# Patient Record
Sex: Female | Born: 1950
Health system: Southern US, Community
[De-identification: ages and names within clinical notes are randomized; demographics above are authoritative.]

## PROBLEM LIST (undated history)

## (undated) DIAGNOSIS — F329 Major depressive disorder, single episode, unspecified: Secondary | ICD-10-CM

## (undated) DIAGNOSIS — I8390 Asymptomatic varicose veins of unspecified lower extremity: Secondary | ICD-10-CM

## (undated) DIAGNOSIS — F1010 Alcohol abuse, uncomplicated: Secondary | ICD-10-CM

## (undated) DIAGNOSIS — E785 Hyperlipidemia, unspecified: Secondary | ICD-10-CM

## (undated) DIAGNOSIS — R160 Hepatomegaly, not elsewhere classified: Secondary | ICD-10-CM

## (undated) DIAGNOSIS — D649 Anemia, unspecified: Secondary | ICD-10-CM

## (undated) DIAGNOSIS — N10 Acute tubulo-interstitial nephritis: Secondary | ICD-10-CM

## (undated) DIAGNOSIS — E559 Vitamin D deficiency, unspecified: Secondary | ICD-10-CM

## (undated) DIAGNOSIS — F32A Depression, unspecified: Secondary | ICD-10-CM

## (undated) DIAGNOSIS — I10 Essential (primary) hypertension: Secondary | ICD-10-CM

## (undated) DIAGNOSIS — G250 Essential tremor: Secondary | ICD-10-CM

## (undated) HISTORY — DX: Essential tremor: G25.0

## (undated) HISTORY — DX: Asymptomatic varicose veins of unspecified lower extremity: I83.90

## (undated) HISTORY — DX: Depression, unspecified: F32.A

## (undated) HISTORY — DX: Acute pyelonephritis: N10

## (undated) HISTORY — DX: Vitamin D deficiency, unspecified: E55.9

## (undated) HISTORY — DX: Essential (primary) hypertension: I10

## (undated) HISTORY — PX: SPLENECTOMY: SUR1306

## (undated) HISTORY — DX: Major depressive disorder, single episode, unspecified: F32.9

## (undated) HISTORY — DX: Anemia, unspecified: D64.9

## (undated) HISTORY — DX: Hyperlipidemia, unspecified: E78.5

## (undated) HISTORY — DX: Alcohol abuse, uncomplicated: F10.10

## (undated) HISTORY — DX: Hepatomegaly, not elsewhere classified: R16.0

## (undated) NOTE — *Deleted (*Deleted)
01/04/2020 9:21 PM   Eileen Stewart 09/22/1950 [id number]  Referring provider: Particia Nearing, PA-C [address] [place],  [place] [address]  No chief complaint on file.   HPI: Eileen Stewart is a 34 year old female with an urethral caruncle, microscopic hematuria and post menopausal bleeding who presents today for follow up.  Urethral caruncle She states that she has been consistent with applying the vaginal estrogen cream Monday, Wednesday nights and Friday nights.  She has not noted any spotting in her underwear or any episodes of gross hematuria.  She states that she feels the area is getting smaller when she examines it in the mirror.  Microscopic hematuria (high risk) Non smoker.  Cystoscopy 01/02/2019 with Dr. Apolinar Junes was NED.  Hematuria likely due urethral caruncle.  UA at PCP was positive for 3-10 RBC's on 03/27/2019 with negative urine culture.  She denies any gross hematuria.  Subsequent UAs have not demonstrated micro heme.  Post menopausal bleeding Underwent endometrial biopsy with Dr. Tiburcio Pea 11/19/2018.  Findings for benign tissue.  No reports of brown spots on underwear today  OAB Patient continues the oxybutynin XL 10 mg daily.  She states that she has noted a worsening in her urinary symptoms over the last several weeks.  She states on one particular occasion, she went for a walk and when she returned to her car she states her incontinent pad was wet and so was her clothing.  She stated that she did not even realize that she had an episode of incontinence.  She is also noted an increase in her urinary urgency as well.  UA is yellow hazy, specific gravity 1.020, pH 7.50 trace blood, 0-5 squamous epithelial cells, 0-5 WBCs, 0-5 RBCs and a few bacteria.    PMH: Past Medical History:  Diagnosis Date  . Acute pyelonephritis   . Alcohol abuse    Alcohol, remission since 2013  . Anemia   . Asymptomatic varicose veins, unspecified laterality   . Depression   .  Essential tremor   . Hyperlipidemia   . Hypertension   . Liver mass   . Vitamin D deficiency     Surgical History: Past Surgical History:  Procedure Laterality Date  . SPLENECTOMY      Home Medications:  Allergies as of 01/04/2020   No Known Allergies     Medication List       Accurate as of January 03, 2020  9:21 PM. If you have any questions, ask your nurse or doctor.        aspirin EC 81 MG tablet Take 1 tablet (81 mg total) by mouth daily.   buPROPion 300 MG 24 hr tablet Commonly known as: WELLBUTRIN XL TK 1 T PO D   CALCIUM 500/D PO Take by mouth.   lisinopril-hydrochlorothiazide 20-12.5 MG tablet Commonly known as: ZESTORETIC Take 1 tablet by mouth daily.   oxybutynin 10 MG 24 hr tablet Commonly known as: DITROPAN-XL Take 1 tablet (10 mg total) by mouth daily.   venlafaxine XR 75 MG 24 hr capsule Commonly known as: EFFEXOR-XR TAKE 1 CAPSULE(75 MG) BY MOUTH DAILY WITH BREAKFAST       Allergies: No Known Allergies  Family History: Family History  Problem Relation Age of Onset  . Hyperlipidemia Mother   . Hypertension Mother   . Alcohol abuse Father   . Depression Sister   . Anxiety disorder Sister   . Parkinson's disease Cousin     Social History:  reports that  she has never smoked. She has never used smokeless tobacco. She reports current alcohol use. She reports that she does not use drugs.  ROS: For pertinent review of systems please refer to history of present illness  Physical Exam: There were no vitals taken for this visit.  Constitutional:  Well nourished. Alert and oriented, No acute distress. HEENT: [place] AT, moist mucus membranes.  Trachea midline, no masses. Cardiovascular: No clubbing, cyanosis, or edema. Respiratory: Normal respiratory effort, no increased work of breathing. GI: Abdomen is soft, non tender, non distended, no abdominal masses. Liver and spleen not palpable.  No hernias appreciated.  Stool sample for occult testing  is not indicated.   GU: No CVA tenderness.  No bladder fullness or masses.  *** external genitalia, *** pubic hair distribution, no lesions.  Normal urethral meatus, no lesions, no prolapse, no discharge.   No urethral masses, tenderness and/or tenderness. No bladder fullness, tenderness or masses. *** vagina mucosa, *** estrogen effect, no discharge, no lesions, *** pelvic support, *** cystocele and *** rectocele noted.  No cervical motion tenderness.  Uterus is freely mobile and non-fixed.  No adnexal/parametria masses or tenderness noted.  Anus and perineum are without rashes or lesions.   ***  Skin: No rashes, bruises or suspicious lesions. Lymph: No cervical or inguinal adenopathy. Neurologic: Grossly intact, no focal deficits, moving all 4 extremities. Psychiatric: Normal mood and affect.   Laboratory Data: Lab Results  Component Value Date   WBC 4.7 03/27/2019   HGB 14.9 03/27/2019   HCT 44.8 03/27/2019   MCV 88 03/27/2019   PLT 340 03/27/2019    Lab Results  Component Value Date   CREATININE 0.78 03/27/2019    Lab Results  Component Value Date   TSH 1.610 03/27/2019       Component Value Date/Time   CHOL 197 03/27/2019 1117   HDL 76 03/27/2019 1117   LDLCALC 113 (H) 03/27/2019 1117    Lab Results  Component Value Date   AST 23 03/27/2019   Lab Results  Component Value Date   ALT 18 03/27/2019    Urinalysis *** I have reviewed the labs.   Pertinent Imaging: ***    Assessment & Plan:    1. Urethral caruncle She will continue applying the vaginal estrogen cream on Monday, Wednesday and Friday It is cost prohibitive for her, so we will try to provide samples for her as it can take several months for the physiologic changes to take place within the vagina She will follow-up in 6 months for exam  2. Microscopic hematuria Likely due to urethral caruncle as microscopic hematuria has not been identified after treating the caruncle with the vaginal estrogen  cream UA is negative today No reports of gross hematuria She will return in 6 months for repeat UA and report any gross hematuria in the interim  3. Post menopausal bleeding Endometrial biopsy negative in 11/2018  4. OAB Not at goal with oxybuytnin XL 10 mg daily  We discussed trying samples of another overactive bladder agent, Myrbetriq, but she inquired if there was higher dose of oxybutynin XL available I stated that there was and since she has just filled her oxybutynin XL 10 mg prescription, she will take 2 of the oxybutynin XL 10 mg daily to see if this improves her urinary symptoms If not, she will follow-up sooner than the scheduled 6 months    No follow-ups on file.  These notes generated with voice recognition software. I apologize for typographical  errors.  Zara Council, PA-C  [facility] [address]  [place], [place] [address] [phone]

---

## 2013-03-12 LAB — COLOGUARD: Cologuard: NEGATIVE

## 2014-10-05 LAB — HM HIV SCREENING LAB: HM HIV SCREENING: NEGATIVE

## 2014-10-05 LAB — HM HEPATITIS C SCREENING LAB: HM Hepatitis Screen: POSITIVE

## 2014-10-08 LAB — HM PAP SMEAR

## 2016-01-04 LAB — HM MAMMOGRAPHY

## 2017-04-17 ENCOUNTER — Encounter: Payer: Self-pay | Admitting: Family Medicine

## 2017-04-17 ENCOUNTER — Ambulatory Visit (INDEPENDENT_AMBULATORY_CARE_PROVIDER_SITE_OTHER): Payer: BLUE CROSS/BLUE SHIELD | Admitting: Family Medicine

## 2017-04-17 VITALS — BP 111/69 | HR 69 | Temp 97.8°F | Ht 60.0 in | Wt 112.6 lb

## 2017-04-17 DIAGNOSIS — F331 Major depressive disorder, recurrent, moderate: Secondary | ICD-10-CM | POA: Diagnosis not present

## 2017-04-17 DIAGNOSIS — F339 Major depressive disorder, recurrent, unspecified: Secondary | ICD-10-CM | POA: Insufficient documentation

## 2017-04-17 DIAGNOSIS — I10 Essential (primary) hypertension: Secondary | ICD-10-CM | POA: Insufficient documentation

## 2017-04-17 DIAGNOSIS — E785 Hyperlipidemia, unspecified: Secondary | ICD-10-CM | POA: Insufficient documentation

## 2017-04-17 DIAGNOSIS — E782 Mixed hyperlipidemia: Secondary | ICD-10-CM | POA: Diagnosis not present

## 2017-04-17 DIAGNOSIS — Z7689 Persons encountering health services in other specified circumstances: Secondary | ICD-10-CM

## 2017-04-17 MED ORDER — FLUOXETINE HCL 10 MG PO TABS: 10.0000 mg | ORAL_TABLET | Freq: Every day | ORAL | 0 refills | Status: DC

## 2017-04-17 NOTE — Progress Notes (Signed)
BP 111/69 (BP Location: Right Arm, Patient Position: Sitting, Cuff Size: Normal)   Pulse 69   Temp 97.8 F (36.6 C) (Tympanic)   Ht 5' (1.524 m)   Wt 112 lb 9.6 oz (51.1 kg)   SpO2 99%   BMI 21.99 kg/m    Subjective:    Patient ID: Eileen Stewart, female    DOB: 1951-01-20, 67 y.o.   MRN: [id number]  HPI: Eileen Stewart is a 67 y.o. female  Chief Complaint  Patient presents with  . Establish Care   Pt here today to establish care. Just moved to the area and needing new PCP. Hx of HTN, hyperlipidemia, and intermittent bouts with depression. HTN well controlled with lisinopril HCTZ, does not check home BPs but always WNL during OVs in the past. Cholesterol controlled with atorvastatin and lifestyle habits. Denies side effects, compliant with medications. Denies CP, SOB, dizziness, HAs.   Only concern today is her depression. States many years ago her moods became out of control and for a short time she needed medications. Came off once she was feeling better and has not needed them since. Has spoken to a counselor off and on the past year. Notes the past 2 months significant dysphoric moods, uncontrollable crying spells, anhedonia. Denies SI/HI, hallucinations, major mood swings. No recent stressors other than the loss of her uncle a week ago, but sxs started prior to that.   Last CPE was done 12/2016 with normal findings.   Depression screen PHQ 2/9 04/17/2017  Decreased Interest 3  Down, Depressed, Hopeless 3  PHQ - 2 Score 6  Altered sleeping 3  Tired, decreased energy 3  Change in appetite 0  Feeling bad or failure about yourself  3  Trouble concentrating 0  Moving slowly or fidgety/restless 0  Suicidal thoughts 0  PHQ-9 Score 15   Relevant past medical, surgical, family and social history reviewed and updated as indicated. Interim medical history since our last visit reviewed. Allergies and medications reviewed and updated.  Review of Systems  Constitutional: Positive  for fatigue.  HENT: Negative.   Eyes: Negative.   Respiratory: Negative.   Cardiovascular: Negative.   Gastrointestinal: Negative.   Genitourinary: Negative.   Musculoskeletal: Negative.   Skin: Negative.   Neurological: Negative.   Psychiatric/Behavioral: Positive for dysphoric mood. The patient is nervous/anxious (constant worrying).    Per HPI unless specifically indicated above     Objective:    BP 111/69 (BP Location: Right Arm, Patient Position: Sitting, Cuff Size: Normal)   Pulse 69   Temp 97.8 F (36.6 C) (Tympanic)   Ht 5' (1.524 m)   Wt 112 lb 9.6 oz (51.1 kg)   SpO2 99%   BMI 21.99 kg/m   Wt Readings from Last 3 Encounters:  04/17/17 112 lb 9.6 oz (51.1 kg)    Physical Exam  Constitutional: She is oriented to person, place, and time. She appears well-developed and well-nourished. No distress.  HENT:  Head: Atraumatic.  Eyes: Conjunctivae are normal. Pupils are equal, round, and reactive to light.  Neck: Normal range of motion. Neck supple.  Cardiovascular: Normal rate and normal heart sounds.  Pulmonary/Chest: Effort normal and breath sounds normal. No respiratory distress.  Musculoskeletal: Normal range of motion.  Neurological: She is alert and oriented to person, place, and time.  Skin: Skin is warm and dry.  Psychiatric:  Tearful during interview, appears nervous  Nursing note and vitals reviewed.  Results for orders placed or performed in visit on 04/17/17  HM MAMMOGRAPHY  Result Value Ref Range   HM Mammogram 0-4 Bi-Rad 0-4 Bi-Rad, Self Reported Normal  HM HIV SCREENING LAB  Result Value Ref Range   HM HIV Screening Negative - Validated   HM HEPATITIS C SCREENING LAB  Result Value Ref Range   HM Hepatitis Screen Positive- Validated   HM PAP SMEAR  Result Value Ref Range   HM Pap smear [facility] Everywhere   Cologuard  Result Value Ref Range   Cologuard Negative       Assessment & Plan:   Problem List Items Addressed This Visit       Cardiovascular and Mediastinum   Hypertension    Stable and well controlled, continue current regimen      Relevant Medications   aspirin EC 81 MG tablet   atorvastatin (LIPITOR) 10 MG tablet   lisinopril-hydrochlorothiazide (PRINZIDE,ZESTORETIC) 20-12.5 MG tablet     Other   Hyperlipidemia - Primary    Stable, continue current regimen      Relevant Medications   aspirin EC 81 MG tablet   atorvastatin (LIPITOR) 10 MG tablet   lisinopril-hydrochlorothiazide (PRINZIDE,ZESTORETIC) 20-12.5 MG tablet   Depression    Long discussion about options, pt agreeable to starting 10 mg prozac and establishing with a new counselor. Risks and side effects reviewed at length, and handout of drug information given. F/u in 6 weeks for recheck      Relevant Medications   FLUoxetine (PROZAC) 10 MG tablet    Other Visit Diagnoses    Encounter to establish care           Follow up plan: Return in about 6 weeks (around 05/29/2017) for Depression f/u.

## 2017-04-17 NOTE — Assessment & Plan Note (Signed)
Long discussion about options, pt agreeable to starting 10 mg prozac and establishing with a new counselor. Risks and side effects reviewed at length, and handout of drug information given. F/u in 6 weeks for recheck

## 2017-04-17 NOTE — Assessment & Plan Note (Signed)
Stable and well controlled, continue current regimen 

## 2017-04-17 NOTE — Assessment & Plan Note (Signed)
Stable, continue current regimen 

## 2017-04-17 NOTE — Patient Instructions (Addendum)
Fluoxetine capsules or tablets (Depression/Mood Disorders) What is this medicine? FLUOXETINE (floo OX e teen) belongs to a class of drugs known as selective serotonin reuptake inhibitors (SSRIs). It helps to treat mood problems such as depression, obsessive compulsive disorder, and panic attacks. It can also treat certain eating disorders. This medicine may be used for other purposes; ask your health care provider or pharmacist if you have questions. COMMON BRAND NAME(S): Prozac What should I tell my health care provider before I take this medicine? They need to know if you have any of these conditions: -bipolar disorder or a family history of bipolar disorder -bleeding disorders -glaucoma -heart disease -liver disease -low levels of sodium in the blood -seizures -suicidal thoughts, plans, or attempt; a previous suicide attempt by you or a family member -take MAOIs like Carbex, Eldepryl, Marplan, Nardil, and Parnate -take medicines that treat or prevent blood clots -thyroid disease -an unusual or allergic reaction to fluoxetine, other medicines, foods, dyes, or preservatives -pregnant or trying to get pregnant -breast-feeding How should I use this medicine? Take this medicine by mouth with a glass of water. Follow the directions on the prescription label. You can take this medicine with or without food. Take your medicine at regular intervals. Do not take it more often than directed. Do not stop taking this medicine suddenly except upon the advice of your doctor. Stopping this medicine too quickly may cause serious side effects or your condition may worsen. A special MedGuide will be given to you by the pharmacist with each prescription and refill. Be sure to read this information carefully each time. Talk to your pediatrician regarding the use of this medicine in children. While this drug may be prescribed for children as young as 7 years for selected conditions, precautions do  apply. Overdosage: If you think you have taken too much of this medicine contact a poison control center or emergency room at once. NOTE: This medicine is only for you. Do not share this medicine with others. What if I miss a dose? If you miss a dose, skip the missed dose and go back to your regular dosing schedule. Do not take double or extra doses. What may interact with this medicine? Do not take this medicine with any of the following medications: -other medicines containing fluoxetine, like Sarafem or Symbyax -cisapride -linezolid -MAOIs like Carbex, Eldepryl, Marplan, Nardil, and Parnate -methylene blue (injected into a vein) -pimozide -thioridazine This medicine may also interact with the following medications: -alcohol -amphetamines -aspirin and aspirin-like medicines -carbamazepine -certain medicines for depression, anxiety, or psychotic disturbances -certain medicines for migraine headaches like almotriptan, eletriptan, frovatriptan, naratriptan, rizatriptan, sumatriptan, zolmitriptan -digoxin -diuretics -fentanyl -flecainide -furazolidone -isoniazid -lithium -medicines for sleep -medicines that treat or prevent blood clots like warfarin, enoxaparin, and dalteparin -NSAIDs, medicines for pain and inflammation, like ibuprofen or naproxen -phenytoin -procarbazine -propafenone -rasagiline -ritonavir -supplements like St. John's wort, kava kava, valerian -tramadol -tryptophan -vinblastine This list may not describe all possible interactions. Give your health care provider a list of all the medicines, herbs, non-prescription drugs, or dietary supplements you use. Also tell them if you smoke, drink alcohol, or use illegal drugs. Some items may interact with your medicine. What should I watch for while using this medicine? Tell your doctor if your symptoms do not get better or if they get worse. Visit your doctor or health care professional for regular checks on your  progress. Because it may take several weeks to see the full effects of this medicine, it   is important to continue your treatment as prescribed by your doctor. Patients and their families should watch out for new or worsening thoughts of suicide or depression. Also watch out for sudden changes in feelings such as feeling anxious, agitated, panicky, irritable, hostile, aggressive, impulsive, severely restless, overly excited and hyperactive, or not being able to sleep. If this happens, especially at the beginning of treatment or after a change in dose, call your health care professional. You may get drowsy or dizzy. Do not drive, use machinery, or do anything that needs mental alertness until you know how this medicine affects you. Do not stand or sit up quickly, especially if you are an older patient. This reduces the risk of dizzy or fainting spells. Alcohol may interfere with the effect of this medicine. Avoid alcoholic drinks. Your mouth may get dry. Chewing sugarless gum or sucking hard candy, and drinking plenty of water may help. Contact your doctor if the problem does not go away or is severe. This medicine may affect blood sugar levels. If you have diabetes, check with your doctor or health care professional before you change your diet or the dose of your diabetic medicine. What side effects may I notice from receiving this medicine? Side effects that you should report to your doctor or health care professional as soon as possible: -allergic reactions like skin rash, itching or hives, swelling of the face, lips, or tongue -anxious -black, tarry stools -breathing problems -changes in vision -confusion -elevated mood, decreased need for sleep, racing thoughts, impulsive behavior -eye pain -fast, irregular heartbeat -feeling faint or lightheaded, falls -feeling agitated, angry, or irritable -hallucination, loss of contact with reality -loss of balance or coordination -loss of memory -painful  or prolonged erections -restlessness, pacing, inability to keep still -seizures -stiff muscles -suicidal thoughts or other mood changes -trouble sleeping -unusual bleeding or bruising -unusually weak or tired -vomiting Side effects that usually do not require medical attention (report to your doctor or health care professional if they continue or are bothersome): -change in appetite or weight -change in sex drive or performance -diarrhea -dry mouth -headache -increased sweating -nausea -tremors This list may not describe all possible side effects. Call your doctor for medical advice about side effects. You may report side effects to FDA at 1-800-FDA-1088. Where should I keep my medicine? Keep out of the reach of children. Store at room temperature between 15 and 30 degrees C (59 and 86 degrees F). Throw away any unused medicine after the expiration date. NOTE: This sheet is a summary. It may not cover all possible information. If you have questions about this medicine, talk to your doctor, pharmacist, or health care provider.  2018 Elsevier/Gold Standard (2015-07-30 15:55:27)  

## 2017-04-22 ENCOUNTER — Ambulatory Visit: Payer: Self-pay | Admitting: Family Medicine

## 2017-05-09 ENCOUNTER — Telehealth: Payer: Self-pay | Admitting: Family Medicine

## 2017-05-09 MED ORDER — FLUOXETINE HCL 10 MG PO TABS: 10.0000 mg | ORAL_TABLET | Freq: Every day | ORAL | 0 refills | Status: DC

## 2017-05-09 NOTE — Telephone Encounter (Signed)
Copied from CRM [id number]. Topic: Quick Communication - Rx Refill/Question >> May 09, 2017 11:40 AM Guinevere FerrariMorris, Eileen Stewart, NT wrote: Medication: FLUoxetine (PROZAC) 10 MG tablet   Has the patient contacted their pharmacy? yes   (Agent: If no, request that the patient contact the pharmacy for the refill.)   Preferred Pharmacy (with phone number or street name):[facility] [id number] - [place] - [id number] [facility] [phone] (Phone) [phone] (Fax)      Agent: Please be advised that RX refills may take up to 3 business days. We ask that you follow-up with your pharmacy.

## 2017-05-09 NOTE — Telephone Encounter (Signed)
Rx sent 

## 2017-05-13 ENCOUNTER — Ambulatory Visit: Payer: BLUE CROSS/BLUE SHIELD | Admitting: Family Medicine

## 2017-05-14 ENCOUNTER — Encounter: Payer: Self-pay | Admitting: Family Medicine

## 2017-05-14 ENCOUNTER — Ambulatory Visit (INDEPENDENT_AMBULATORY_CARE_PROVIDER_SITE_OTHER): Payer: BLUE CROSS/BLUE SHIELD | Admitting: Family Medicine

## 2017-05-14 VITALS — BP 118/72 | HR 63 | Temp 97.5°F | Wt 112.5 lb

## 2017-05-14 DIAGNOSIS — G250 Essential tremor: Secondary | ICD-10-CM

## 2017-05-14 DIAGNOSIS — F331 Major depressive disorder, recurrent, moderate: Secondary | ICD-10-CM | POA: Diagnosis not present

## 2017-05-14 MED ORDER — FLUOXETINE HCL 20 MG PO TABS: 20.0000 mg | ORAL_TABLET | Freq: Every day | ORAL | 3 refills | Status: DC

## 2017-05-14 NOTE — Progress Notes (Signed)
BP 118/72 (BP Location: Left Arm, Patient Position: Sitting, Cuff Size: Normal)   Pulse 63   Temp (!) 97.5 F (36.4 C) (Tympanic)   Wt 112 lb 8 oz (51 kg)   SpO2 99%   BMI 21.97 kg/m    Subjective:    Patient ID: Eileen Stewart, female    DOB: 1951-03-07, 67 y.o.   MRN: [id number]  HPI: Eileen Stewart is a 67 y.o. female  Chief Complaint  Patient presents with  . Depression    F/U patient states she still has bad days, but good days as well.  . Shaking    Patient states her hands will shake. Stated possible referral to Neurologist.   Pt here today for 1 month f/u on her depression after starting low dose prozac. Still not quite wanting to go out and socialize as much as she used to, but overall has noticed a significant benefit since starting the medicine. Has not followed up regarding counseling just yet but plans to. Not having the crying spells she used to and feeling so much happier overall. Denies side effects, including SI/HI.   Right hand tremor, worsening x 3 years. 2 cousins with Parkinson's disease so pt is concerned about it. Seems to only be with intention, and much worse with stress. Has not tried anything OTC for sxs.   Past Medical History:  Diagnosis Date  . Acute pyelonephritis   . Alcohol abuse    Alcohol, remission since 2013  . Anemia   . Asymptomatic varicose veins, unspecified laterality   . Depression   . Essential tremor   . Hyperlipidemia   . Hypertension   . Liver mass   . Vitamin D deficiency    Social History   Socioeconomic History  . Marital status: Single    Spouse name: Not on file  . Number of children: Not on file  . Years of education: Not on file  . Highest education level: Not on file  Social Needs  . Financial resource strain: Not on file  . Food insecurity - worry: Not on file  . Food insecurity - inability: Not on file  . Transportation needs - medical: Not on file  . Transportation needs - non-medical: Not on file    Occupational History  . Not on file  Tobacco Use  . Smoking status: Never Smoker  . Smokeless tobacco: Never Used  Substance and Sexual Activity  . Alcohol use: No    Frequency: Never    Comment: Alcohol remession since 2013  . Drug use: No  . Sexual activity: Not on file  Other Topics Concern  . Not on file  Social History Narrative  . Not on file   Relevant past medical, surgical, family and social history reviewed and updated as indicated. Interim medical history since our last visit reviewed. Allergies and medications reviewed and updated.  Review of Systems  Per HPI unless specifically indicated above     Objective:    BP 118/72 (BP Location: Left Arm, Patient Position: Sitting, Cuff Size: Normal)   Pulse 63   Temp (!) 97.5 F (36.4 C) (Tympanic)   Wt 112 lb 8 oz (51 kg)   SpO2 99%   BMI 21.97 kg/m   Wt Readings from Last 3 Encounters:  05/14/17 112 lb 8 oz (51 kg)  04/17/17 112 lb 9.6 oz (51.1 kg)    Physical Exam  Constitutional: She is oriented to person, place, and time. She appears well-developed and well-nourished. No  distress.  HENT:  Head: Atraumatic.  Eyes: Conjunctivae are normal. Pupils are equal, round, and reactive to light. No scleral icterus.  Neck: Normal range of motion. Neck supple.  Cardiovascular: Normal rate, regular rhythm and normal heart sounds.  Pulmonary/Chest: Effort normal and breath sounds normal.  Musculoskeletal: Normal range of motion.  Neurological: She is alert and oriented to person, place, and time.  Minimal tremor of intention, mainly in right hand  Skin: Skin is warm and dry.  Psychiatric: She has a normal mood and affect. Her behavior is normal.  Nursing note and vitals reviewed.  Results for orders placed or performed in visit on 04/17/17  HM MAMMOGRAPHY  Result Value Ref Range   HM Mammogram 0-4 Bi-Rad 0-4 Bi-Rad, Self Reported Normal  HM HIV SCREENING LAB  Result Value Ref Range   HM HIV Screening Negative -  Validated   HM HEPATITIS C SCREENING LAB  Result Value Ref Range   HM Hepatitis Screen Positive- Validated   HM PAP SMEAR  Result Value Ref Range   HM Pap smear [facility] Everywhere   Cologuard  Result Value Ref Range   Cologuard Negative       Assessment & Plan:   Problem List Items Addressed This Visit      Nervous and Auditory   Benign essential tremor    Reassurance given to pt, treatment and/or referral offered. Pt wanting to hold off for now and see if things improve with the improvement of her stress levels once the prozac dosing is adequate        Other   Depression - Primary    Significant improvement with addition of prozac, will increase to 20 mg and monitor closely for further benefit      Relevant Medications   FLUoxetine (PROZAC) 20 MG tablet       Follow up plan: Return in about 8 weeks (around 07/09/2017) for Mood f/u.

## 2017-05-16 DIAGNOSIS — G250 Essential tremor: Secondary | ICD-10-CM | POA: Insufficient documentation

## 2017-05-16 NOTE — Assessment & Plan Note (Signed)
Reassurance given to pt, treatment and/or referral offered. Pt wanting to hold off for now and see if things improve with the improvement of her stress levels once the prozac dosing is adequate

## 2017-05-16 NOTE — Assessment & Plan Note (Signed)
Significant improvement with addition of prozac, will increase to 20 mg and monitor closely for further benefit

## 2017-05-16 NOTE — Patient Instructions (Signed)
Follow up in 2 months

## 2017-05-29 ENCOUNTER — Ambulatory Visit: Payer: BLUE CROSS/BLUE SHIELD | Admitting: Family Medicine

## 2017-06-19 ENCOUNTER — Telehealth: Payer: Self-pay | Admitting: Family Medicine

## 2017-06-19 NOTE — Telephone Encounter (Signed)
Copied from CRM [id number]. Topic: Quick Communication - See Telephone Encounter >> Jun 19, 2017  1:28 PM Lorrine KinMcGee, Conlan Miceli B, [place] wrote: CRM for notification. See Telephone encounter for: 06/19/17. Patient calling to request a call from Roosvelt Maserachel Lane or her assistant. She has some medication questions regarding her FLUoxetine (PROZAC) 20 MG tablet. She started with 10mg  tablet and was increased to 20mg . Wants to know if it is ok for her to increase her dose and by how much? She was doing ok, but states she took a plunge and is not quite back where she was. Patient also stated that she has not yet started her counseling.

## 2017-06-19 NOTE — Telephone Encounter (Signed)
Returned call, left message to call back. She is fine to increase to 40 mg if desired - I will send in increased dose if she would like

## 2017-06-20 MED ORDER — FLUOXETINE HCL 40 MG PO CAPS: 40.0000 mg | ORAL_CAPSULE | Freq: Every day | ORAL | 1 refills | Status: DC

## 2017-06-20 NOTE — Telephone Encounter (Signed)
Patient returned my call. I let her know what Fleet ContrasRachel said and patient would like the increased dose sent in to the pharmacy in her chart.

## 2017-06-20 NOTE — Telephone Encounter (Signed)
Called and left patient a VM asking for her to please return my call.  

## 2017-06-20 NOTE — Telephone Encounter (Signed)
Rx increased.

## 2017-07-09 ENCOUNTER — Ambulatory Visit: Payer: BLUE CROSS/BLUE SHIELD | Admitting: Family Medicine

## 2017-07-30 ENCOUNTER — Ambulatory Visit (INDEPENDENT_AMBULATORY_CARE_PROVIDER_SITE_OTHER): Payer: BLUE CROSS/BLUE SHIELD

## 2017-07-30 ENCOUNTER — Other Ambulatory Visit: Payer: Self-pay

## 2017-07-30 ENCOUNTER — Ambulatory Visit
Admission: EM | Admit: 2017-07-30 | Discharge: 2017-07-30 | Disposition: A | Discharge status: Home / Self Care | Payer: BLUE CROSS/BLUE SHIELD | Attending: Family Medicine | Admitting: Family Medicine

## 2017-07-30 ENCOUNTER — Encounter: Payer: Self-pay | Admitting: Emergency Medicine

## 2017-07-30 DIAGNOSIS — M79644 Pain in right finger(s): Secondary | ICD-10-CM | POA: Diagnosis not present

## 2017-07-30 DIAGNOSIS — S60221A Contusion of right hand, initial encounter: Secondary | ICD-10-CM | POA: Diagnosis not present

## 2017-07-30 DIAGNOSIS — W230XXA Caught, crushed, jammed, or pinched between moving objects, initial encounter: Secondary | ICD-10-CM

## 2017-07-30 NOTE — ED Triage Notes (Signed)
Patient got her right hand caught in the car window yesterday.  Patient c/o bruising and pain on the top of her right hand.

## 2017-07-30 NOTE — Discharge Instructions (Signed)
Apply ice 20 minutes out of every 2 hours 4-5 times daily for comfort.  °

## 2017-07-30 NOTE — ED Provider Notes (Signed)
[facility]    CSN: [id number] Arrival date & time: 07/30/17  1127     History   Chief Complaint Chief Complaint  Patient presents with  . Hand Pain    HPI Eileen Stewart is a 67 y.o. female.   HPI  67 year old female right-hand-dominant states that yesterday she had her right hand caught in a window that was closed on her hand.  That time she has had pain and swelling mostly of the PIP joint of the right middle finger a large amount of ecchymosis over the dorsum of the hand.  She has additional ecchymosis on the volar aspect of the MP and PIP area of the third finger.  He has good range of motion that is comfortable.         Past Medical History:  Diagnosis Date  . Acute pyelonephritis   . Alcohol abuse    Alcohol, remission since 2013  . Anemia   . Asymptomatic varicose veins, unspecified laterality   . Depression   . Essential tremor   . Hyperlipidemia   . Hypertension   . Liver mass   . Vitamin D deficiency     Patient Active Problem List   Diagnosis Date Noted  . Benign essential tremor 05/16/2017  . Hyperlipidemia 04/17/2017  . Hypertension 04/17/2017  . Depression 04/17/2017    Past Surgical History:  Procedure Laterality Date  . SPLENECTOMY      OB History   None      Home Medications    Prior to Admission medications   Medication Sig Start Date End Date Taking? Authorizing Provider  aspirin EC 81 MG tablet Take by mouth.   Yes [provider]  atorvastatin (LIPITOR) 10 MG tablet TK 1 T PO QD 02/08/17  Yes [provider]  Calcium-Magnesium-Vitamin D (CALCIUM 500 PO) Take by mouth.   Yes [provider]  FLUoxetine (PROZAC) 40 MG capsule Take 1 capsule (40 mg total) by mouth daily. 06/20/17  Yes Particia Nearing, PA-C  lisinopril-hydrochlorothiazide (PRINZIDE,ZESTORETIC) 20-12.5 MG tablet TK 1 T PO QD 02/08/17  Yes [provider]    Family History Family History  Problem Relation  Age of Onset  . Hyperlipidemia Mother   . Hypertension Mother   . Alcohol abuse Father   . Depression Sister   . Anxiety disorder Sister   . Parkinson's disease Cousin     Social History Social History   Tobacco Use  . Smoking status: Never Smoker  . Smokeless tobacco: Never Used  Substance Use Topics  . Alcohol use: No    Frequency: Never    Comment: Alcohol remession since 2013  . Drug use: No     Allergies   Patient has no known allergies.   Review of Systems Review of Systems  Constitutional: Positive for activity change. Negative for appetite change, chills, fatigue and fever.  Musculoskeletal: Positive for arthralgias and joint swelling.  Skin: Positive for color change.  All other systems reviewed and are negative.    Physical Exam Triage Vital Signs ED Triage Vitals  Enc Vitals Group     BP 07/30/17 1146 123/68     Pulse Rate 07/30/17 1146 67     Resp 07/30/17 1146 16     Temp 07/30/17 1146 98.2 F (36.8 C)     Temp Source 07/30/17 1146 Oral     SpO2 07/30/17 1146 98 %     Weight 07/30/17 1143 110 lb (49.9 kg)  Height 07/30/17 1143  (1.549 m)     Head Circumference --      Peak Flow --      Pain Score 07/30/17 1143 3     Pain Loc --      Pain Edu? --      Excl. in GC? --    No data found.  Updated Vital Signs BP 123/68 (BP Location: Left Arm)   Pulse 67   Temp 98.2 F (36.8 C) (Oral)   Resp 16   Ht  (1.549 m)   Wt 110 lb (49.9 kg)   SpO2 98%   BMI 20.78 kg/m   Visual Acuity Right Eye Distance:   Left Eye Distance:   Bilateral Distance:    Right Eye Near:   Left Eye Near:    Bilateral Near:     Physical Exam  Constitutional: She is oriented to person, place, and time. She appears well-developed and well-nourished. No distress.  HENT:  Head: Normocephalic.  Eyes: Pupils are equal, round, and reactive to light.  Neck: Normal range of motion. Neck supple.  Musculoskeletal: Normal range of motion. She exhibits edema  and tenderness.  Exmanation of the right dominant hand shows extensive ecchymosis over the dorsum of the hand.  She also has ecchymosis over the volar aspect but confined to the base of the third digit over the metacarpal and also over the PIP volarly.  He has very good range of motion with only slight decreased of the middle finger.  She has tenderness swelling over the PIP to A&P compression and  lateral compression.  The collateral ligaments are intact.  She has a small abrasion overlying the dorsum of the middle finger over the  Proximal phalanx distally.  Neurological: She is alert and oriented to person, place, and time.  Skin: Skin is warm and dry. She is not diaphoretic.  Psychiatric: She has a normal mood and affect. Her behavior is normal. Judgment and thought content normal.  Nursing note and vitals reviewed.    UC Treatments / Results  Labs (all labs ordered are listed, but only abnormal results are displayed) Labs Reviewed - No data to display  EKG None  Radiology Dg Finger Middle Right  Result Date: 07/30/2017 CLINICAL DATA:  Right middle finger pain and swelling after hand got caught in a window. EXAM: RIGHT MIDDLE FINGER 2+V COMPARISON:  None. FINDINGS: No acute fracture or dislocation. Joint spaces are preserved. Bone mineralization is normal. Mild dorsal soft tissue swelling over the PIP joint. IMPRESSION: Mild dorsal soft tissue swelling over the PIP joint. No acute osseous abnormality. Electronically Signed   By: Obie Dredge M.D.   On: 07/30/2017 12:20    Procedures Procedures (including critical care time)  Medications Ordered in UC Medications - No data to display  Initial Impression / Assessment and Plan / UC Course  I have reviewed the triage vital signs and the nursing notes.  Pertinent labs & imaging results that were available during my care of the patient were reviewed by me and considered in my medical decision making (see chart for details).      .Plan: 1. Test/x-ray results and diagnosis reviewed with patient 2. rx as per orders; risks, benefits, potential side effects reviewed with patient 3. Recommend supportive treatment with ice and elevation to control swelling and pain.  Gentle range of motion stretching exercises to maintain joint motion.  Were demonstrated to her.  Follow-up with her primary care if she is not  improving 4. F/u prn if symptoms worsen or don't improve  Final Clinical Impressions(s) / UC Diagnoses   Final diagnoses:  Contusion of right hand, initial encounter     Discharge Instructions     Apply ice 20 minutes out of every 2 hours 4-5 times daily for comfort.     ED Prescriptions    None     Controlled Substance Prescriptions [place] Controlled Substance Registry consulted? Not Applicable   Lutricia Feil, PA-C 07/30/17 1255

## 2017-09-16 ENCOUNTER — Emergency Department
Admission: EM | Admit: 2017-09-16 | Discharge: 2017-09-16 | Disposition: A | Discharge status: Home / Self Care | Payer: Medicare HMO | Attending: Student in an Organized Health Care Education/Training Program | Admitting: Student in an Organized Health Care Education/Training Program

## 2017-09-16 ENCOUNTER — Other Ambulatory Visit: Payer: Self-pay

## 2017-09-16 ENCOUNTER — Encounter: Payer: Self-pay | Admitting: *Deleted

## 2017-09-16 DIAGNOSIS — I10 Essential (primary) hypertension: Secondary | ICD-10-CM | POA: Insufficient documentation

## 2017-09-16 DIAGNOSIS — Z79899 Other long term (current) drug therapy: Secondary | ICD-10-CM | POA: Diagnosis not present

## 2017-09-16 DIAGNOSIS — N3090 Cystitis, unspecified without hematuria: Secondary | ICD-10-CM

## 2017-09-16 DIAGNOSIS — T675XXA Heat exhaustion, unspecified, initial encounter: Secondary | ICD-10-CM | POA: Diagnosis present

## 2017-09-16 DIAGNOSIS — Z7982 Long term (current) use of aspirin: Secondary | ICD-10-CM | POA: Insufficient documentation

## 2017-09-16 DIAGNOSIS — T673XXA Heat exhaustion, anhydrotic, initial encounter: Secondary | ICD-10-CM | POA: Diagnosis not present

## 2017-09-16 LAB — URINALYSIS, COMPLETE (UACMP) WITH MICROSCOPIC
Bilirubin Urine: NEGATIVE
GLUCOSE, UA: NEGATIVE mg/dL
HGB URINE DIPSTICK: NEGATIVE
Ketones, ur: 20 mg/dL — AB
NITRITE: NEGATIVE
PROTEIN: NEGATIVE mg/dL
Specific Gravity, Urine: 1.016 (ref 1.005–1.030)
WBC, UA: >50 WBC/hpf — ABNORMAL HIGH (ref 0–5)
pH: 7 (ref 5.0–8.0)

## 2017-09-16 MED ORDER — CEPHALEXIN 500 MG PO CAPS: 500.0000 mg | ORAL_CAPSULE | Freq: Three times a day (TID) | ORAL | 0 refills | Status: AC

## 2017-09-16 NOTE — ED Triage Notes (Signed)
Pt reports having been outside in the heat for an extended amount of time today and started to experience nausea and dry heaves. Pt wen tot he shower to cool self off. Pt reports having experienced increased anxiety after EMS arrived at her home. Pt no longer reports increased anxiety and reports improvement in nausea. Pt continues to feel tired. NAD noted. Pt able to drink some Gatorade after nausea passed and has been able to keep that down.

## 2017-09-16 NOTE — ED Provider Notes (Signed)
[facility] Emergency Department Provider Note    First MD Initiated Contact with Patient 09/16/17 1416     (approximate)  I have reviewed the triage vital signs and the nursing notes.   HISTORY  Chief Complaint Heat Exposure    HPI Pieter PartridgeMelissa Rogerson is a 67 y.o. female history of depression presents the ER with chief complaint of concern for heat illness.  Patient was out working in the yard.  States she is feeling lightheaded every time she stood up and then started feeling overwhelming heat like she could not get cold off.  She went inside.  Started feeling nauseated with dry heaves.  She then laid on the bed and try to cool down but this was not helping so she got into the shower and cold off that way.  States that symptoms did significantly improve that way.  Denies any numbness or tingling.  No lateralizing weakness.  Denies any chest pain or shortness of breath.  Denies any discomfort at this time.  States she is has had decreased oral intake over the past few days.  New medications.    Past Medical History:  Diagnosis Date  . Acute pyelonephritis   . Alcohol abuse    Alcohol, remission since 2013  . Anemia   . Asymptomatic varicose veins, unspecified laterality   . Depression   . Essential tremor   . Hyperlipidemia   . Hypertension   . Liver mass   . Vitamin D deficiency    Family History  Problem Relation Age of Onset  . Hyperlipidemia Mother   . Hypertension Mother   . Alcohol abuse Father   . Depression Sister   . Anxiety disorder Sister   . Parkinson's disease Cousin    Past Surgical History:  Procedure Laterality Date  . SPLENECTOMY     Patient Active Problem List   Diagnosis Date Noted  . Benign essential tremor 05/16/2017  . Hyperlipidemia 04/17/2017  . Hypertension 04/17/2017  . Depression 04/17/2017      Prior to Admission medications   Medication Sig Start Date End Date Taking? Authorizing Provider  aspirin EC 81 MG  tablet Take by mouth.    [provider]  atorvastatin (LIPITOR) 10 MG tablet TK 1 T PO QD 02/08/17   [provider]  Calcium-Magnesium-Vitamin D (CALCIUM 500 PO) Take by mouth.    [provider]  FLUoxetine (PROZAC) 40 MG capsule Take 1 capsule (40 mg total) by mouth daily. 06/20/17   Particia NearingLane, Rachel Elizabeth, PA-C  lisinopril-hydrochlorothiazide (PRINZIDE,ZESTORETIC) 20-12.5 MG tablet TK 1 T PO QD 02/08/17   [provider]    Allergies Patient has no known allergies.    Social History Social History   Tobacco Use  . Smoking status: Never Smoker  . Smokeless tobacco: Never Used  Substance Use Topics  . Alcohol use: No    Frequency: Never    Comment: Alcohol remession since 2013  . Drug use: No    Review of Systems Patient denies headaches, rhinorrhea, blurry vision, numbness, shortness of breath, chest pain, edema, cough, abdominal pain, nausea, vomiting, diarrhea, dysuria, fevers, rashes or hallucinations unless otherwise stated above in HPI. ____________________________________________   PHYSICAL EXAM:  VITAL SIGNS: Vitals:   09/16/17 1333  BP: (!) 152/60  Pulse: (!) 56  Resp: 16  Temp: 98 F (36.7 C)  SpO2: 100%    Constitutional: Alert and oriented.  Eyes: Conjunctivae are normal.  Head: Atraumatic. Nose: No congestion/rhinnorhea. Mouth/Throat: Mucous membranes  are moist.   Neck: No stridor. Painless ROM.  Cardiovascular: Normal rate, regular rhythm. Grossly normal heart sounds.  Good peripheral circulation. Respiratory: Normal respiratory effort.  No retractions. Lungs CTAB. Gastrointestinal: Soft and nontender. No distention. No abdominal bruits. No CVA tenderness. Genitourinary:  Musculoskeletal: No lower extremity tenderness nor edema.  No joint effusions. Neurologic:  CN- intact.  No facial droop, Normal FNF.  Normal heel to shin.  Sensation intact bilaterally. Normal speech and language. No gross focal neurologic  deficits are appreciated. No gait instability.  Skin:  Skin is warm, dry and intact. No rash noted. Psychiatric: Mood and affect are normal. Speech and behavior are normal.  ____________________________________________   LABS (all labs ordered are listed, but only abnormal results are displayed)  Results for orders placed or performed during the hospital encounter of 09/16/17 (from the past 24 hour(s))  Urinalysis, Complete w Microscopic     Status: Abnormal   Collection Time: 09/16/17  3:20 PM  Result Value Ref Range   Color, Urine YELLOW (A) YELLOW   APPearance CLOUDY (A) CLEAR   Specific Gravity, Urine 1.016 1.005 - 1.030   pH 7.0 5.0 - 8.0   Glucose, UA NEGATIVE NEGATIVE mg/dL   Hgb urine dipstick NEGATIVE NEGATIVE   Bilirubin Urine NEGATIVE NEGATIVE   Ketones, ur 20 (A) NEGATIVE mg/dL   Protein, ur NEGATIVE NEGATIVE mg/dL   Nitrite NEGATIVE NEGATIVE   Leukocytes, UA LARGE (A) NEGATIVE   WBC, UA >50 (H) 0 - 5 WBC/hpf   Bacteria, UA RARE (A) NONE SEEN   Squamous Epithelial / LPF 0-5 0 - 5   Mucus PRESENT    Hyaline Casts, UA PRESENT    Amorphous Crystal PRESENT    ____________________________________________  EKG My review and personal interpretation at Time: 15:31   Indication: heat illness  Rate: 60  Rhythm: sinus Axis: normal Other: normal intervals, no stemi ____________________________________________  RADIOLOGY   ____________________________________________   PROCEDURES  Procedure(s) performed:  Procedures    Critical Care performed: no ____________________________________________   INITIAL IMPRESSION / ASSESSMENT AND PLAN / ED COURSE  Pertinent labs & imaging results that were available during my care of the patient were reviewed by me and considered in my medical decision making (see chart for details).   DDX: heat illness, heat syncope, UTI, dysrhythmia, dehydration  Varshini Kleeman is a 67 y.o. who presents to the ED with symptoms as  described above.  Patient well-appearing.  No focal neuro deficits presentation not clinically consistent with TIA or CVA.  Less consistent with persistent metabolic deformity as symptoms resolved after coming in from the heat and cooling off.  Does report increased urinary frequency therefore will check urine.  I have recommended for the blood work as well as IV fluids patient currently declining this at this time.  We will check urine as well as EKG and reassess.  Clinical Course as of Sep 17 1547  Mon Sep 16, 2017  1547 Patient reassessed.  Does endorse foul-smelling odor on her urine for the past several days.  She is up ambulating.  She feels well at this time.  I do not feel that further diagnostic testing is clinically indicated at this point as it does seem most clinically consistent with a component of dehydration with heat illness probably exacerbated mildly by some component of underlying anxiety.  No signs or symptoms of ACS.  Her abdominal exam is soft benign.  Neuro exam is nonfocal.  I did recommend IV fluids but patient  is tolerating oral hydration would prefer to do this at home.  He started on outpatient antibiotics.  Discussed signs and symptoms for which she should return immediately to the hospital.   [PR]    Clinical Course User Index [PR] Willy Eddy, MD     As part of my medical decision making, I reviewed the following data within the electronic MEDICAL RECORD NUMBER Nursing notes reviewed and incorporated, Labs reviewed, notes from prior ED visits.   ____________________________________________   FINAL CLINICAL IMPRESSION(S) / ED DIAGNOSES  Final diagnoses:  Heat exhaustion, initial encounter  Cystitis      NEW MEDICATIONS STARTED DURING THIS VISIT:  New Prescriptions   No medications on file     Note:  This document was prepared using Dragon voice recognition software and may include unintentional dictation errors.    Willy Eddy, MD 09/16/17  [phone]

## 2017-09-16 NOTE — ED Notes (Signed)
Patient denies pain and is resting comfortably.  

## 2017-09-16 NOTE — ED Notes (Signed)
AAOx3.  Skin warm and dry.  NAD 

## 2017-09-16 NOTE — ED Notes (Signed)
First Nurse Note:  This RN assisted patient out of vehicle.  Patient is alert and oriented x 4.  When this RN asked patient what brought her to the ED patient states, "I don't want to talk about it."  Patient was able to stand and turn with ease.  Patient's sister states patient "got over heated" they called EMS and then came to the ED by private vehicle.  Patient is complaining of feeling cold at this time.

## 2017-09-16 NOTE — ED Notes (Signed)
AAOx3.  Skin warm and dry. NAD>  States she was feeling anxious earlier, but now feels much better.  No complaints.

## 2017-09-20 ENCOUNTER — Ambulatory Visit (INDEPENDENT_AMBULATORY_CARE_PROVIDER_SITE_OTHER): Payer: Medicare HMO | Admitting: Family Medicine

## 2017-09-20 ENCOUNTER — Encounter: Payer: Self-pay | Admitting: Family Medicine

## 2017-09-20 VITALS — BP 162/84 | HR 71 | Temp 98.2°F | Ht 60.0 in | Wt 109.4 lb

## 2017-09-20 DIAGNOSIS — G250 Essential tremor: Secondary | ICD-10-CM | POA: Diagnosis not present

## 2017-09-20 DIAGNOSIS — I10 Essential (primary) hypertension: Secondary | ICD-10-CM

## 2017-09-20 DIAGNOSIS — E782 Mixed hyperlipidemia: Secondary | ICD-10-CM

## 2017-09-20 DIAGNOSIS — F331 Major depressive disorder, recurrent, moderate: Secondary | ICD-10-CM

## 2017-09-20 DIAGNOSIS — R69 Illness, unspecified: Secondary | ICD-10-CM | POA: Diagnosis not present

## 2017-09-20 MED ORDER — BUSPIRONE HCL 7.5 MG PO TABS: 7.5000 mg | ORAL_TABLET | Freq: Two times a day (BID) | ORAL | 1 refills | Status: DC

## 2017-09-20 NOTE — Patient Instructions (Signed)
Buspirone tablets What is this medicine? BUSPIRONE (byoo SPYE rone) is used to treat anxiety disorders. This medicine may be used for other purposes; ask your health care provider or pharmacist if you have questions. COMMON BRAND NAME(S): BuSpar What should I tell my health care provider before I take this medicine? They need to know if you have any of these conditions: -kidney or liver disease -an unusual or allergic reaction to buspirone, other medicines, foods, dyes, or preservatives -pregnant or trying to get pregnant -breast-feeding How should I use this medicine? Take this medicine by mouth with a glass of water. Follow the directions on the prescription label. You may take this medicine with or without food. To ensure that this medicine always works the same way for you, you should take it either always with or always without food. Take your doses at regular intervals. Do not take your medicine more often than directed. Do not stop taking except on the advice of your doctor or health care professional. Talk to your pediatrician regarding the use of this medicine in children. Special care may be needed. Overdosage: If you think you have taken too much of this medicine contact a poison control center or emergency room at once. NOTE: This medicine is only for you. Do not share this medicine with others. What if I miss a dose? If you miss a dose, take it as soon as you can. If it is almost time for your next dose, take only that dose. Do not take double or extra doses. What may interact with this medicine? Do not take this medicine with any of the following medications: -linezolid -MAOIs like Carbex, Eldepryl, Marplan, Nardil, and Parnate -methylene blue -procarbazine This medicine may also interact with the following medications: -diazepam -digoxin -diltiazem -erythromycin -grapefruit juice -haloperidol -medicines for mental depression or mood problems -medicines for seizures like  carbamazepine, phenobarbital and phenytoin -nefazodone -other medications for anxiety -rifampin -ritonavir -some antifungal medicines like itraconazole, ketoconazole, and voriconazole -verapamil -warfarin This list may not describe all possible interactions. Give your health care provider a list of all the medicines, herbs, non-prescription drugs, or dietary supplements you use. Also tell them if you smoke, drink alcohol, or use illegal drugs. Some items may interact with your medicine. What should I watch for while using this medicine? Visit your doctor or health care professional for regular checks on your progress. It may take 1 to 2 weeks before your anxiety gets better. You may get drowsy or dizzy. Do not drive, use machinery, or do anything that needs mental alertness until you know how this drug affects you. Do not stand or sit up quickly, especially if you are an older patient. This reduces the risk of dizzy or fainting spells. Alcohol can make you more drowsy and dizzy. Avoid alcoholic drinks. What side effects may I notice from receiving this medicine? Side effects that you should report to your doctor or health care professional as soon as possible: -blurred vision or other vision changes -chest pain -confusion -difficulty breathing -feelings of hostility or anger -muscle aches and pains -numbness or tingling in hands or feet -ringing in the ears -skin rash and itching -vomiting -weakness Side effects that usually do not require medical attention (report to your doctor or health care professional if they continue or are bothersome): -disturbed dreams, nightmares -headache -nausea -restlessness or nervousness -sore throat and nasal congestion -stomach upset This list may not describe all possible side effects. Call your doctor for medical advice about side   effects. You may report side effects to FDA at 1-800-FDA-1088. Where should I keep my medicine? Keep out of the reach  of children. Store at room temperature below 30 degrees C (86 degrees F). Protect from light. Keep container tightly closed. Throw away any unused medicine after the expiration date. NOTE: This sheet is a summary. It may not cover all possible information. If you have questions about this medicine, talk to your doctor, pharmacist, or health care provider.  2018 Elsevier/Gold Standard (2009-10-06 18:06:11)  

## 2017-09-20 NOTE — Progress Notes (Signed)
BP (!) 162/84   Pulse 71   Temp 98.2 F (36.8 C) (Oral)   Ht 5' (1.524 m)   Wt 109 lb 6.4 oz (49.6 kg)   SpO2 98%   BMI 21.37 kg/m    Subjective:    Patient ID: Eileen Stewart, female    DOB: [birth year], 67 y.o.   MRN: [id number]  HPI: Eileen Stewart is a 67 y.o. female  Chief Complaint  Patient presents with  . Depression   Pt here for depression f/u. Currently taking 40 mg prozac and states her moods/tearfulness/nerves are worse than ever the past month or so. Feels anxious all the time. Initially improved with the prozac but now feels like it's not working at all for her. Has not yet gone to counseling, has an appt set up for next week. No major mood swings, SI/HI, hopelessness. Feels like the constant worrying is the biggest issue.   Depression screen [facility] 2/9 05/14/2017 04/17/2017  Decreased Interest 3 3  Down, Depressed, Hopeless 2 3  PHQ - 2 Score 5 6  Altered sleeping 3 3  Tired, decreased energy 2 3  Change in appetite 2 0  Feeling bad or failure about yourself  1 3  Trouble concentrating 2 0  Moving slowly or fidgety/restless 0 0  Suicidal thoughts 0 0  PHQ-9 Score 15 15   GAD 7 : Generalized Anxiety Score 09/22/2017  Nervous, Anxious, on Edge 3  Control/stop worrying 3  Worry too much - different things 3  Trouble relaxing 2  Restless 2  Easily annoyed or irritable 2  Afraid - awful might happen 3  Total GAD 7 Score 18  Anxiety Difficulty Very difficult   Tremor seems to be worsening. Pt feels it's related to her stress levels.   Unsure if she wants to still be on her lipitor. Not taking them consistently at this point. Has some concerns about their safety. Has not had any side effects from them. Watches her diet, stays active. Denies CP, SOB, claudication.   Past Medical History:  Diagnosis Date  . Acute pyelonephritis   . Alcohol abuse    Alcohol, remission since 2013  . Anemia   . Asymptomatic varicose veins, unspecified laterality   . Depression     . Essential tremor   . Hyperlipidemia   . Hypertension   . Liver mass   . Vitamin D deficiency    Social History   Socioeconomic History  . Marital status: Single    Spouse name: Not on file  . Number of children: Not on file  . Years of education: Not on file  . Highest education level: Not on file  Occupational History  . Not on file  Social Needs  . Financial resource strain: Not on file  . Food insecurity:    Worry: Not on file    Inability: Not on file  . Transportation needs:    Medical: Not on file    Non-medical: Not on file  Tobacco Use  . Smoking status: Never Smoker  . Smokeless tobacco: Never Used  Substance and Sexual Activity  . Alcohol use: No    Frequency: Never    Comment: Alcohol remession since 2013  . Drug use: No  . Sexual activity: Not on file  Lifestyle  . Physical activity:    Days per week: Not on file    Minutes per session: Not on file  . Stress: Not on file  Relationships  . Social connections:  Talks on phone: Not on file    Gets together: Not on file    Attends religious service: Not on file    Active member of club or organization: Not on file    Attends meetings of clubs or organizations: Not on file    Relationship status: Not on file  . Intimate partner violence:    Fear of current or ex partner: Not on file    Emotionally abused: Not on file    Physically abused: Not on file    Forced sexual activity: Not on file  Other Topics Concern  . Not on file  Social History Narrative  . Not on file   Relevant past medical, surgical, family and social history reviewed and updated as indicated. Interim medical history since our last visit reviewed. Allergies and medications reviewed and updated.  Review of Systems  Per HPI unless specifically indicated above     Objective:    BP (!) 162/84   Pulse 71   Temp 98.2 F (36.8 C) (Oral)   Ht 5' (1.524 m)   Wt 109 lb 6.4 oz (49.6 kg)   SpO2 98%   BMI 21.37 kg/m   Wt  Readings from Last 3 Encounters:  09/20/17 109 lb 6.4 oz (49.6 kg)  09/16/17 114 lb (51.7 kg)  07/30/17 110 lb (49.9 kg)    Physical Exam  Constitutional: She is oriented to person, place, and time. She appears well-developed and well-nourished. No distress.  HENT:  Head: Atraumatic.  Eyes: Pupils are equal, round, and reactive to light. Conjunctivae are normal.  Neck: Normal range of motion. Neck supple.  Cardiovascular: Normal rate, regular rhythm, normal heart sounds and intact distal pulses.  Pulmonary/Chest: Effort normal and breath sounds normal.  Musculoskeletal: Normal range of motion.  Neurological: She is alert and oriented to person, place, and time.  Tremor of intention present, worse on right hand  Skin: Skin is warm and dry.  Psychiatric: She has a normal mood and affect. Her behavior is normal.  Nursing note and vitals reviewed.   Results for orders placed or performed in visit on 09/20/17  Comprehensive metabolic panel  Result Value Ref Range   Glucose 79 65 - 99 mg/dL   BUN 7 (L) 8 - 27 mg/dL   Creatinine, Ser 1.61 0.57 - 1.00 mg/dL   GFR calc non Af Amer 92 >59 mL/min/1.73   GFR calc Af Amer 106 >59 mL/min/1.73   BUN/Creatinine Ratio 10 (L) 12 - 28   Sodium 130 (L) 134 - 144 mmol/L   Potassium 4.8 3.5 - 5.2 mmol/L   Chloride 89 (L) 96 - 106 mmol/L   CO2 23 20 - 29 mmol/L   Calcium 9.7 8.7 - 10.3 mg/dL   Total Protein 7.1 6.0 - 8.5 g/dL   Albumin 5.0 (H) 3.6 - 4.8 g/dL   Globulin, Total 2.1 1.5 - 4.5 g/dL   Albumin/Globulin Ratio 2.4 (H) 1.2 - 2.2   Bilirubin Total 0.7 0.0 - 1.2 mg/dL   Alkaline Phosphatase 72 39 - 117 IU/L   AST 29 0 - 40 IU/L   ALT 22 0 - 32 IU/L  Lipid Panel w/o Chol/HDL Ratio  Result Value Ref Range   Cholesterol, Total 159 100 - 199 mg/dL   Triglycerides 57 0 - 149 mg/dL   HDL 76 >09 mg/dL   VLDL Cholesterol Cal 11 5 - 40 mg/dL   LDL Calculated 72 0 - 99 mg/dL      Assessment &  Plan:   Problem List Items Addressed This Visit       Cardiovascular and Mediastinum   Hypertension    BP elevated, taking medications faithfully. Pt believes it's due to her stress levels at this time. Will recheck at upcoming f/u and adjust if needed        Nervous and Auditory   Benign essential tremor    Stable, consistent with essential tremor. Reassurance given. Pt does not want to see Neurology yet but states if it becomes worse she would like to. Does not want any medications to help with it at this time either        Other   Hyperlipidemia    Will check lipids today. Pt hoping to come off statin, will discuss once labs return. Continue lifestyle modifications and lipitor for now      Relevant Orders   Comprehensive metabolic panel (Completed)   Lipid Panel w/o Chol/HDL Ratio (Completed)   Depression - Primary    D/c prozac, start buspar. Has counseling session set for next week. Continue working toward stress reduction with lifestyle      Relevant Medications   busPIRone (BUSPAR) 7.5 MG tablet       Follow up plan: Return in about 1 month (around 10/18/2017) for Anxiety, schedule Welcome to Medicare .

## 2017-09-21 LAB — LIPID PANEL W/O CHOL/HDL RATIO
CHOLESTEROL TOTAL: 159 mg/dL (ref 100–199)
HDL: 76 mg/dL (ref >39)
LDL CALC: 72 mg/dL (ref 0–99)
TRIGLYCERIDES: 57 mg/dL (ref 0–149)
VLDL Cholesterol Cal: 11 mg/dL (ref 5–40)

## 2017-09-21 LAB — COMPREHENSIVE METABOLIC PANEL
A/G RATIO: 2.4 — AB (ref 1.2–2.2)
ALBUMIN: 5 g/dL — AB (ref 3.6–4.8)
ALT: 22 IU/L (ref 0–32)
AST: 29 IU/L (ref 0–40)
Alkaline Phosphatase: 72 IU/L (ref 39–117)
BUN/Creatinine Ratio: 10 — ABNORMAL LOW (ref 12–28)
BUN: 7 mg/dL — ABNORMAL LOW (ref 8–27)
Bilirubin Total: 0.7 mg/dL (ref 0.0–1.2)
CO2: 23 mmol/L (ref 20–29)
CREATININE: 0.7 mg/dL (ref 0.57–1.00)
Calcium: 9.7 mg/dL (ref 8.7–10.3)
Chloride: 89 mmol/L — ABNORMAL LOW (ref 96–106)
GFR calc Af Amer: 106 mL/min/{1.73_m2} (ref >59)
GFR, EST NON AFRICAN AMERICAN: 92 mL/min/{1.73_m2} (ref >59)
GLOBULIN, TOTAL: 2.1 g/dL (ref 1.5–4.5)
Glucose: 79 mg/dL (ref 65–99)
POTASSIUM: 4.8 mmol/L (ref 3.5–5.2)
SODIUM: 130 mmol/L — AB (ref 134–144)
Total Protein: 7.1 g/dL (ref 6.0–8.5)

## 2017-09-22 NOTE — Assessment & Plan Note (Signed)
D/c prozac, start buspar. Has counseling session set for next week. Continue working toward stress reduction with lifestyle

## 2017-09-22 NOTE — Assessment & Plan Note (Signed)
Will check lipids today. Pt hoping to come off statin, will discuss once labs return. Continue lifestyle modifications and lipitor for now

## 2017-09-22 NOTE — Assessment & Plan Note (Signed)
BP elevated, taking medications faithfully. Pt believes it's due to her stress levels at this time. Will recheck at upcoming f/u and adjust if needed

## 2017-09-22 NOTE — Assessment & Plan Note (Signed)
Stable, consistent with essential tremor. Reassurance given. Pt does not want to see Neurology yet but states if it becomes worse she would like to. Does not want any medications to help with it at this time either

## 2017-09-24 ENCOUNTER — Telehealth: Payer: Self-pay | Admitting: Family Medicine

## 2017-09-24 NOTE — Telephone Encounter (Signed)
Called pt to discuss labs - excellent cholesterol levels, pt requesting to d/c lipitor for 6 months and see how it looks at that time as she has concerns about being on statins long-term. Aware that if levels become elevated that she may have to restart and ok with that.   Also discussed possibly some mild dehydration and protein deficiency in her diet with the CMP results. Pt admits to not wanting to eat or drink much sometimes and will try to eat a more balanced diet and drink more fluids.   Will recheck things at 6 month f/u

## 2017-10-01 ENCOUNTER — Telehealth: Payer: Self-pay | Admitting: Family Medicine

## 2017-10-01 NOTE — Telephone Encounter (Signed)
Copied from CRM [id number]. Topic: Quick Communication - Rx Refill/Question >> Oct 01, 2017  1:02 PM Eileen Stewart, Eileen Stewart wrote: Medication: pt states the busPIRone is working but maybe too well. She is wondering if she can half the dose and add a mood  elevator that was discuss at appt 7/12 with Eileen Maserachel Lane, Eileen Stewart. Pt has gone off FLUoxetine.  Has the patient contacted their pharmacy? no Preferred Pharmacy (with phone number or street name): [id number] - [place], [place] - [address] [phone] (Phone) [phone] (Fax)

## 2017-10-02 NOTE — Telephone Encounter (Signed)
LOV 09/20/17  Roosvelt Maserachel Lane See pt. request

## 2017-10-03 ENCOUNTER — Telehealth: Payer: Self-pay | Admitting: Family Medicine

## 2017-10-03 MED ORDER — BUPROPION HCL ER (XL) 150 MG PO TB24: 150.0000 mg | ORAL_TABLET | Freq: Every day | ORAL | 0 refills | Status: DC

## 2017-10-03 NOTE — Telephone Encounter (Signed)
Discussed with patient cutting back to once a day on buspar and adding wellbutrin as she felt the buspar helped significantly with her anxiety but it "subdued" her too much.

## 2017-10-04 NOTE — Telephone Encounter (Signed)
Called pt yesterday, see telephone encounter for notes

## 2017-10-12 ENCOUNTER — Other Ambulatory Visit: Payer: Self-pay | Admitting: Family Medicine

## 2017-10-14 NOTE — Telephone Encounter (Signed)
See request from pharmacy.  LOV  09/20/17 NOV 10/23/17 Roosvelt Maserachel Lane, [place]

## 2017-10-16 DIAGNOSIS — R69 Illness, unspecified: Secondary | ICD-10-CM | POA: Diagnosis not present

## 2017-10-23 ENCOUNTER — Ambulatory Visit (INDEPENDENT_AMBULATORY_CARE_PROVIDER_SITE_OTHER): Payer: Medicare HMO | Admitting: Family Medicine

## 2017-10-23 ENCOUNTER — Encounter: Payer: Self-pay | Admitting: Family Medicine

## 2017-10-23 VITALS — BP 151/71 | HR 67 | Temp 97.8°F | Ht 62.0 in | Wt 111.2 lb

## 2017-10-23 DIAGNOSIS — G250 Essential tremor: Secondary | ICD-10-CM | POA: Diagnosis not present

## 2017-10-23 DIAGNOSIS — R69 Illness, unspecified: Secondary | ICD-10-CM | POA: Diagnosis not present

## 2017-10-23 DIAGNOSIS — F331 Major depressive disorder, recurrent, moderate: Secondary | ICD-10-CM

## 2017-10-23 DIAGNOSIS — R300 Dysuria: Secondary | ICD-10-CM | POA: Diagnosis not present

## 2017-10-23 DIAGNOSIS — N898 Other specified noninflammatory disorders of vagina: Secondary | ICD-10-CM

## 2017-10-23 LAB — UA/M W/RFLX CULTURE, ROUTINE
Bilirubin, UA: NEGATIVE
GLUCOSE, UA: NEGATIVE
KETONES UA: NEGATIVE
Leukocytes, UA: NEGATIVE
NITRITE UA: NEGATIVE
Protein, UA: NEGATIVE
Specific Gravity, UA: 1.01 (ref 1.005–1.030)
UUROB: 0.2 mg/dL (ref 0.2–1.0)
pH, UA: 7 (ref 5.0–7.5)

## 2017-10-23 LAB — WET PREP FOR TRICH, YEAST, CLUE
Clue Cell Exam: NEGATIVE
TRICHOMONAS EXAM: NEGATIVE
YEAST EXAM: NEGATIVE

## 2017-10-23 LAB — MICROSCOPIC EXAMINATION: Bacteria, UA: NONE SEEN

## 2017-10-23 MED ORDER — BUPROPION HCL ER (XL) 300 MG PO TB24
300.0000 mg | ORAL_TABLET | Freq: Every day | ORAL | 1 refills | Status: DC
Start: 2017-10-23 — End: 2017-11-20

## 2017-10-23 MED ORDER — VORTIOXETINE HBR 5 MG PO TABS: 5.0000 mg | ORAL_TABLET | Freq: Every day | ORAL | 1 refills | Status: DC

## 2017-10-23 NOTE — Progress Notes (Signed)
BP (!) 151/71 (BP Location: Right Arm, Patient Position: Sitting, Cuff Size: Normal)   Pulse 67   Temp 97.8 F (36.6 C) (Oral)   Ht 5\' 2"  (1.575 m)   Wt 111 lb 3.2 oz (50.4 kg)   SpO2 97%   BMI 20.34 kg/m    Subjective:    Patient ID: Eileen PartridgeMelissa Stewart, female    DOB: 03/10/1951, 67 y.o.   MRN: [id number]  HPI: Eileen Stewart is a 67 y.o. female  Chief Complaint  Patient presents with  . Anxiety    Patient states her symptoms are still about the same.  . Depression   Here today for mood follow up. Taking 1 buspar and 1 wellbutrin daily without any noted benefit. Feels very on edge, labile moods, clenching teeth, sighing a lot, still having the tremor pretty significantly. Initially thought she was improving just on the buspar but then felt it was too strong so decreased dose. Denies SI/HI, appetite or sleep issues. Started counseling, feels great about that.   Also recently noticing some vaginal discharge, dysuria, and odor. Denies any concern for STIs, new soaps, fevers, rashes or lesions, N/V, back pain.   [facility] 2/[facility] 10/23/2017 05/14/2017 04/17/2017  Decreased Interest 2 3 3   Down, Depressed, Hopeless 2 2 3   PHQ - 2 Score 4 5 6   Altered sleeping 1 3 3   Tired, decreased energy 2 2 3   Change in appetite 1 2 0  Feeling bad or failure about yourself  1 1 3   Trouble concentrating 2 2 0  Moving slowly or fidgety/restless 2 0 0  Suicidal thoughts 1 0 0  PHQ-9 Score 14 15 15    GAD 7 : Generalized Anxiety Score 10/23/2017 09/22/2017  Nervous, Anxious, on Edge 3 3  Control/stop worrying 1 3  Worry too much - different things 3 3  Trouble relaxing 1 2  Restless 2 2  Easily annoyed or irritable 3 2  Afraid - awful might happen 3 3  Total GAD 7 Score 16 18  Anxiety Difficulty Very difficult Very difficult    Social History   Socioeconomic History  . Marital status: Single    Spouse name: Not on file  . Number of children: Not on file  . Years of education: Not  on file  . Highest education level: Not on file  Occupational History  . Not on file  Social Needs  . Financial resource strain: Not on file  . Food insecurity:    Worry: Not on file    Inability: Not on file  . Transportation needs:    Medical: Not on file    Non-medical: Not on file  Tobacco Use  . Smoking status: Never Smoker  . Smokeless tobacco: Never Used  Substance and Sexual Activity  . Alcohol use: No    Frequency: Never    Comment: Alcohol remession since 2013  . Drug use: No  . Sexual activity: Not on file  Lifestyle  . Physical activity:    Days per week: Not on file    Minutes per session: Not on file  . Stress: Not on file  Relationships  . Social connections:    Talks on phone: Not on file    Gets together: Not on file    Attends religious service: Not on file    Active member of club or organization: Not on file    Attends meetings of clubs or organizations: Not on file    Relationship status: Not  on file  . Intimate partner violence:    Fear of current or ex partner: Not on file    Emotionally abused: Not on file    Physically abused: Not on file    Forced sexual activity: Not on file  Other Topics Concern  . Not on file  Social History Narrative  . Not on file   Relevant past medical, surgical, family and social history reviewed and updated as indicated. Interim medical history since our last visit reviewed. Allergies and medications reviewed and updated.  Review of Systems  Per HPI unless specifically indicated above     Objective:    BP (!) 151/71 (BP Location: Right Arm, Patient Position: Sitting, Cuff Size: Normal)   Pulse 67   Temp 97.8 F (36.6 C) (Oral)   Ht 5\' 2"  (1.575 m)   Wt 111 lb 3.2 oz (50.4 kg)   SpO2 97%   BMI 20.34 kg/m   Wt Readings from Last 3 Encounters:  10/23/17 111 lb 3.2 oz (50.4 kg)  09/20/17 109 lb 6.4 oz (49.6 kg)  09/16/17 114 lb (51.7 kg)    Physical Exam  Constitutional: She is oriented to person,  place, and time. She appears well-developed and well-nourished. No distress.  HENT:  Head: Atraumatic.  Eyes: Conjunctivae and EOM are normal.  Neck: Normal range of motion. Neck supple.  Cardiovascular: Normal rate and regular rhythm.  Pulmonary/Chest: Effort normal and breath sounds normal.  Abdominal: Soft. Bowel sounds are normal. There is no tenderness.  Genitourinary:  Genitourinary Comments: GU exam declined, pt self swabbed  Musculoskeletal: Normal range of motion. She exhibits no tenderness (No CVA ttp).  Neurological: She is alert and oriented to person, place, and time.  Essential tremor, worst in right hand  Skin: Skin is warm and dry.  Psychiatric: She has a normal mood and affect. Her behavior is normal.  Nursing note and vitals reviewed.   Results for orders placed or performed in visit on 10/23/17  WET PREP FOR TRICH, YEAST, CLUE  Result Value Ref Range   Trichomonas Exam Negative Negative   Yeast Exam Negative Negative   Clue Cell Exam Negative Negative  Microscopic Examination  Result Value Ref Range   WBC, UA 0-5 0 - 5 /hpf   RBC, UA 0-2 0 - 2 /hpf   Epithelial Cells (non renal) CANCELED    Bacteria, UA None seen None seen/Few  UA/M w/rflx Culture, Routine  Result Value Ref Range   Specific Gravity, UA 1.010 1.005 - 1.030   pH, UA 7.0 5.0 - 7.5   Color, UA Yellow Yellow   Appearance Ur Clear Clear   Leukocytes, UA Negative Negative   Protein, UA Negative Negative/Trace   Glucose, UA Negative Negative   Ketones, UA Negative Negative   RBC, UA Trace (A) Negative   Bilirubin, UA Negative Negative   Urobilinogen, Ur 0.2 0.2 - 1.0 mg/dL   Nitrite, UA Negative Negative   Microscopic Examination See below:       Assessment & Plan:   Problem List Items Addressed This Visit      Nervous and Auditory   Benign essential tremor    Reassurance given again to pt, who continues to be nervous about the tremor. Requesting Neurology referral. Does not wish to  start medications today for tremor      Relevant Orders   Ambulatory referral to Neurology     Other   Depression - Primary    Has now failed multiple SSRIs  and wellbutrin. Will try trintellix with wellbutrin increased to 300 mg and monitor closely. Continue counseling. F/u if no better.       Relevant Medications   vortioxetine HBr (TRINTELLIX) 5 MG TABS tablet   buPROPion (WELLBUTRIN XL) 300 MG 24 hr tablet    Other Visit Diagnoses    Dysuria       U/A neg. Push fluids, recheck if sxs worsen.    Relevant Orders   UA/M w/rflx Culture, Routine (Completed)   Vaginal discharge       Wet prep neg. Vaginal hygiene reviewed. F/u if worsening   Relevant Orders   WET PREP FOR TRICH, YEAST, CLUE (Completed)       Follow up plan: Return in about 4 weeks (around 11/20/2017) for mood f/u.

## 2017-10-24 ENCOUNTER — Telehealth: Payer: Self-pay | Admitting: Family Medicine

## 2017-10-24 NOTE — Telephone Encounter (Signed)
Patient was taking prozac but according to progress note from 09/20/2017, Prozac was discontinued for not working and Buspar was added.   Rachel sent in Trintellex from visit 10/24/2017 for depression.  Should I attempt to do PA or send in another medication for patient that's more cost efficient?

## 2017-10-24 NOTE — Telephone Encounter (Signed)
Copied from CRM [id number]. Topic: Quick Communication - See Telephone Encounter >> Oct 24, 2017 11:48 AM Luanna Coleawoud, Jessica L wrote: CRM for notification. See Telephone encounter for: 10/24/17.vortioxetine HBr (TRINTELLIX) 5 MG TABS tablet [[id number]][[id number]] pt called and stated that her copay will be 100 and would like something more cost efficient called in. Please advise [phone]

## 2017-10-24 NOTE — Telephone Encounter (Signed)
Progress note pending about patient's visit yesterday, will need this for PA. [facility] when [facility] returns to [facility].

## 2017-10-26 NOTE — Assessment & Plan Note (Signed)
Has now failed multiple SSRIs and wellbutrin. Will try trintellix with wellbutrin increased to 300 mg and monitor closely. Continue counseling. F/u if no better.

## 2017-10-26 NOTE — Assessment & Plan Note (Signed)
Reassurance given again to pt, who continues to be nervous about the tremor. Requesting Neurology referral. Does not wish to start medications today for tremor

## 2017-10-26 NOTE — Patient Instructions (Signed)
Follow up in 1 month   

## 2017-10-31 NOTE — Telephone Encounter (Signed)
Note has been closed.   

## 2017-11-05 DIAGNOSIS — R69 Illness, unspecified: Secondary | ICD-10-CM | POA: Diagnosis not present

## 2017-11-15 ENCOUNTER — Telehealth: Payer: Self-pay | Admitting: Family Medicine

## 2017-11-15 NOTE — Telephone Encounter (Signed)
Copied from CRM [id number]. Topic: Appointment Scheduling - Scheduling Inquiry for Clinic >> Nov 15, 2017 11:15 AM Luanna Cole wrote: Reason for CRM: pt called and stated that she does not want to come in for appointment 11/20/17. Pt would like to talk with someone on the phone instead. Please advise

## 2017-11-15 NOTE — Telephone Encounter (Signed)
Left VM for patient stating she would need to either keep the appointment or if the appointment did not work we could reschedule it but she would need to come in the office for the appointment.  Just FYI

## 2017-11-16 ENCOUNTER — Other Ambulatory Visit: Payer: Self-pay | Admitting: Family Medicine

## 2017-11-18 DIAGNOSIS — R69 Illness, unspecified: Secondary | ICD-10-CM | POA: Diagnosis not present

## 2017-11-19 DIAGNOSIS — R69 Illness, unspecified: Secondary | ICD-10-CM | POA: Diagnosis not present

## 2017-11-20 ENCOUNTER — Encounter: Payer: Self-pay | Admitting: Family Medicine

## 2017-11-20 ENCOUNTER — Ambulatory Visit (INDEPENDENT_AMBULATORY_CARE_PROVIDER_SITE_OTHER): Payer: Medicare HMO | Admitting: Family Medicine

## 2017-11-20 ENCOUNTER — Other Ambulatory Visit: Payer: Self-pay

## 2017-11-20 VITALS — BP 138/82 | HR 68 | Temp 97.7°F | Ht 62.0 in | Wt 111.1 lb

## 2017-11-20 DIAGNOSIS — R69 Illness, unspecified: Secondary | ICD-10-CM | POA: Diagnosis not present

## 2017-11-20 DIAGNOSIS — Z23 Encounter for immunization: Secondary | ICD-10-CM

## 2017-11-20 DIAGNOSIS — F331 Major depressive disorder, recurrent, moderate: Secondary | ICD-10-CM

## 2017-11-20 MED ORDER — LISINOPRIL-HYDROCHLOROTHIAZIDE 20-12.5 MG PO TABS: 1.0000 | ORAL_TABLET | Freq: Every day | ORAL | 1 refills | Status: DC

## 2017-11-20 MED ORDER — BUPROPION HCL ER (XL) 300 MG PO TB24: 300.0000 mg | ORAL_TABLET | Freq: Every day | ORAL | 1 refills | Status: DC

## 2017-11-20 NOTE — Progress Notes (Signed)
BP 138/82   Pulse 68   Temp 97.7 F (36.5 C) (Oral)   Ht 5\' 2"  (1.575 m)   Wt 111 lb 1.6 oz (50.4 kg)   SpO2 98%   BMI 20.32 kg/m    Subjective:    Patient ID: Eileen Stewart, female    DOB: [birth year], 67 y.o.   MRN: [id number]  HPI: Darriel Stewart is a 67 y.o. female  Chief Complaint  Patient presents with  . Depression  . Anxiety   Here today for anxiety and depression f/u. Increased wellbutrin to 300 mg and pt doing significantly better. Did not pick up the trintellix as her insurance did not cover it right away but feels she may not need it. Still worrying often, but moods much improved and does not feel like her worrying affects her quality of life any longer. Denies SI/HI, panic episodes, severe mood swings.   Depression screen [facility] 2/9 11/20/2017 10/23/2017 05/14/2017  Decreased Interest 0 2 3  Down, Depressed, Hopeless 0 2 2  PHQ - 2 Score 0 4 5  Altered sleeping 1 1 3   Tired, decreased energy 0 2 2  Change in appetite 0 1 2  Feeling bad or failure about yourself  0 1 1  Trouble concentrating 0 2 2  Moving slowly or fidgety/restless 0 2 0  Suicidal thoughts 0 1 0  PHQ-9 Score 1 14 15    GAD 7 : Generalized Anxiety Score 11/20/2017 10/23/2017 09/22/2017  Nervous, Anxious, on Edge 1 3 3   Control/stop worrying 1 1 3   Worry too much - different things 1 3 3   Trouble relaxing 0 1 2  Restless 0 2 2  Easily annoyed or irritable 2 3 2   Afraid - awful might happen 0 3 3  Total GAD 7 Score 5 16 18   Anxiety Difficulty - Very difficult Very difficult     Relevant past medical, surgical, family and social history reviewed and updated as indicated. Interim medical history since our last visit reviewed. Allergies and medications reviewed and updated.  Review of Systems  Per HPI unless specifically indicated above     Objective:    BP 138/82   Pulse 68   Temp 97.7 F (36.5 C) (Oral)   Ht 5\' 2"  (1.575 m)   Wt 111 lb 1.6 oz (50.4 kg)   SpO2 98%   BMI 20.32 kg/m     Wt Readings from Last 3 Encounters:  11/20/17 111 lb 1.6 oz (50.4 kg)  10/23/17 111 lb 3.2 oz (50.4 kg)  09/20/17 109 lb 6.4 oz (49.6 kg)    Physical Exam  Constitutional: She is oriented to person, place, and time. She appears well-developed and well-nourished. No distress.  HENT:  Head: Atraumatic.  Eyes: Conjunctivae and EOM are normal.  Neck: Normal range of motion. Neck supple.  Cardiovascular: Normal rate and regular rhythm.  Pulmonary/Chest: Effort normal and breath sounds normal.  Musculoskeletal: Normal range of motion.  Neurological: She is alert and oriented to person, place, and time.  Skin: Skin is warm and dry.  Psychiatric: She has a normal mood and affect. Her behavior is normal.  Nursing note and vitals reviewed.   Results for orders placed or performed in visit on 10/23/17  WET PREP FOR TRICH, YEAST, CLUE  Result Value Ref Range   Trichomonas Exam Negative Negative   Yeast Exam Negative Negative   Clue Cell Exam Negative Negative  Microscopic Examination  Result Value Ref Range   WBC, UA 0-5  0 - 5 /hpf   RBC, UA 0-2 0 - 2 /hpf   Epithelial Cells (non renal) CANCELED    Bacteria, UA None seen None seen/Few  UA/M w/rflx Culture, Routine  Result Value Ref Range   Specific Gravity, UA 1.010 1.005 - 1.030   pH, UA 7.0 5.0 - 7.5   Color, UA Yellow Yellow   Appearance Ur Clear Clear   Leukocytes, UA Negative Negative   Protein, UA Negative Negative/Trace   Glucose, UA Negative Negative   Ketones, UA Negative Negative   RBC, UA Trace (A) Negative   Bilirubin, UA Negative Negative   Urobilinogen, Ur 0.2 0.2 - 1.0 mg/dL   Nitrite, UA Negative Negative   Microscopic Examination See below:       Assessment & Plan:   Problem List Items Addressed This Visit      Other   Depression - Primary    Significant improvement with increased wellbutrin. Will add trintellix if needed in the future but for now continue wellbutrin at 300 mg. Recommended initiation of  counseling, pt looking into that with list that was provided      Relevant Medications   buPROPion (WELLBUTRIN XL) 300 MG 24 hr tablet    Other Visit Diagnoses    Flu vaccine need       Relevant Orders   Flu vaccine HIGH DOSE PF (Completed)       Follow up plan: Return in about 6 months (around 05/21/2018) for Mood, BP.

## 2017-11-23 NOTE — Assessment & Plan Note (Signed)
Significant improvement with increased wellbutrin. Will add trintellix if needed in the future but for now continue wellbutrin at 300 mg. Recommended initiation of counseling, pt looking into that with list that was provided

## 2017-11-23 NOTE — Patient Instructions (Signed)
Follow up in 6 months 

## 2017-11-28 DIAGNOSIS — R69 Illness, unspecified: Secondary | ICD-10-CM | POA: Diagnosis not present

## 2017-12-04 NOTE — Telephone Encounter (Signed)
Left VM that patient would need to schedule an appointment in order to get medication for her.  I told her to give a call back and we would try to get her in ASAP.  Just FYI

## 2017-12-04 NOTE — Telephone Encounter (Signed)
This is not something that can be done over the phone, if she wants a new medication or a change in medications she will need an appt  Copied from CRM [phone]. Topic: General - Other >> Dec 03, 2017  9:08 AM Percival SpanishKennedy, Cheryl W wrote:  Pt call to say a death is going to happen soon maybe this week and she is asking if there is something [facility] [address] will be able to RX her.    [facility] >> Dec 04, 2017  8:49 AM Leafy Roobinson, Norma J wrote: Pt needs something to get her through the funeral. The pt has not passed away yet and funeral date has not been set

## 2017-12-23 ENCOUNTER — Ambulatory Visit (INDEPENDENT_AMBULATORY_CARE_PROVIDER_SITE_OTHER): Payer: Medicare HMO | Admitting: Family Medicine

## 2017-12-23 ENCOUNTER — Encounter: Payer: Self-pay | Admitting: Family Medicine

## 2017-12-23 VITALS — BP 140/86 | HR 68 | Temp 98.2°F | Ht 61.3 in | Wt 112.5 lb

## 2017-12-23 DIAGNOSIS — I10 Essential (primary) hypertension: Secondary | ICD-10-CM | POA: Diagnosis not present

## 2017-12-23 DIAGNOSIS — Z0000 Encounter for general adult medical examination without abnormal findings: Secondary | ICD-10-CM

## 2017-12-23 DIAGNOSIS — F339 Major depressive disorder, recurrent, unspecified: Secondary | ICD-10-CM | POA: Diagnosis not present

## 2017-12-23 DIAGNOSIS — R69 Illness, unspecified: Secondary | ICD-10-CM | POA: Diagnosis not present

## 2017-12-23 MED ORDER — BUPROPION HCL ER (XL) 150 MG PO TB24: 150.0000 mg | ORAL_TABLET | Freq: Every day | ORAL | 2 refills | Status: DC

## 2017-12-23 NOTE — Progress Notes (Signed)
Subjective:    Eileen Stewart is a 67 y.o. female who presents for a Welcome to Medicare exam.   Feeling like she's on too much wellbutrin since increasing to 300 mg. Feeling too keyed up. Did better on 150 mg dose. Still doing better overall than without the medication. Denies SI/HI. Has not been going to counseling regularly.   Review of Systems Review of Systems  Constitutional: Negative.   HENT: Negative.   Respiratory: Negative.   Cardiovascular: Negative.   Genitourinary: Negative.   Musculoskeletal: Negative.   Skin: Negative.   Neurological: Negative.   Psychiatric/Behavioral: The patient is nervous/anxious.           Objective:    Today's Vitals   12/23/17 1046 12/23/17 1104  BP: (!) 173/82 140/86  Pulse: 68   Temp: 98.2 F (36.8 C)   SpO2: 98%   Weight: 112 lb 8 oz (51 kg)   Height: 5' 1.3" (1.557 m)   Body mass index is 21.05 kg/m.  Medications Outpatient Encounter Medications as of 12/23/2017  Medication Sig  . aspirin EC 81 MG tablet Take by mouth.  Marland Kitchen buPROPion (WELLBUTRIN XL) 150 MG 24 hr tablet Take 1 tablet (150 mg total) by mouth daily.  . Calcium Carbonate-Vitamin D (CALCIUM 500/D PO) Take by mouth.  . Calcium-Magnesium-Vitamin D (CALCIUM 500 PO) Take by mouth.  Marland Kitchen lisinopril-hydrochlorothiazide (PRINZIDE,ZESTORETIC) 20-12.5 MG tablet Take 1 tablet by mouth daily.  . [DISCONTINUED] buPROPion (WELLBUTRIN XL) 300 MG 24 hr tablet Take 1 tablet (300 mg total) by mouth daily.   No facility-administered encounter medications on file as of 12/23/2017.      History: Past Medical History:  Diagnosis Date  . Acute pyelonephritis   . Alcohol abuse    Alcohol, remission since 2013  . Anemia   . Asymptomatic varicose veins, unspecified laterality   . Depression   . Essential tremor   . Hyperlipidemia   . Hypertension   . Liver mass   . Vitamin D deficiency    Past Surgical History:  Procedure Laterality Date  . SPLENECTOMY      Family  History  Problem Relation Age of Onset  . Hyperlipidemia Mother   . Hypertension Mother   . Alcohol abuse Father   . Depression Sister   . Anxiety disorder Sister   . Parkinson's disease Cousin    Social History   Occupational History  . Not on file  Tobacco Use  . Smoking status: Never Smoker  . Smokeless tobacco: Never Used  Substance and Sexual Activity  . Alcohol use: No    Frequency: Never    Comment: Alcohol remession since 2013  . Drug use: No  . Sexual activity: Not on file    Tobacco Counseling Counseling given: Not Answered   Immunizations and Health Maintenance Immunization History  Administered Date(s) Administered  . Influenza, High Dose Seasonal PF 11/20/2017  . Influenza,inj,Quad PF,6+ Mos 11/14/2016  . Influenza-Unspecified 11/14/2016  . Meningococcal Conjugate 10/06/2015  . PPD Test 12/24/2012  . Pneumococcal Conjugate-13 11/14/2016  . Pneumococcal Polysaccharide-23 10/08/2014  . Td 06/28/2014  . Zoster Recombinat (Shingrix) 11/14/2016   There are no preventive care reminders to display for this patient.  Activities of Daily Living In your present state of health, do you have any difficulty performing the following activities: 12/23/2017 09/20/2017  Hearing? N N  Vision? Y N  Difficulty concentrating or making decisions? Y N  Walking or climbing stairs? N N  Dressing or bathing? N N  Doing errands, shopping? N N    Physical Exam  Physical Exam  Constitutional: She is oriented to person, place, and time and well-developed, well-nourished, and in no distress. No distress.  HENT:  Head: Atraumatic.  Eyes: Pupils are equal, round, and reactive to light. Conjunctivae are normal. No scleral icterus.  Neck: Normal range of motion. Neck supple.  Cardiovascular: Normal rate, regular rhythm and normal heart sounds.  Pulmonary/Chest: Effort normal and breath sounds normal. No respiratory distress.  Musculoskeletal: Normal range of motion.    Neurological: She is alert and oriented to person, place, and time.  Skin: Skin is warm and dry.  Psychiatric: Affect and judgment normal.  Nursing note and vitals reviewed. (optional), or other factors deemed appropriate based on the beneficiary's medical and social history and current clinical standards.  Advanced Directives: A voluntary discussion about advance care planning including the explanation and discussion of advance directives was extensively discussed  with the patient.  Explanation about the health care proxy and Living will was reviewed and packet with forms with explanation of how to fill them out was given.  During this discussion, the patient was not able to identify a health care proxy and plans to fill out the paperwork required at a later date.  Patient was offered a separate Advance Care Planning visit for further assistance with forms.  Time spent: 15   Individuals present - patient      Assessment:    This is a routine wellness examination for this patient . 1. Encounter for Medicare annual wellness exam Health maintenance reviewed, medicare requirements fulfilled  2. Essential hypertension Improved on recheck to 140/86. High normal, pt requesting to continue to monitor and not increase medication just yet. Continue currnet regimen, work on salt restriction.   3. Major depression, recurrent, chronic (HCC) Will decrease wellbutrin back to 150 mg as patient felt better at this dose. Encouraged her to restart counseling.    Vision/Hearing screen  Hearing Screening   125Hz  250Hz  500Hz  1000Hz  2000Hz  3000Hz  4000Hz  6000Hz  8000Hz   Right ear:   20 20 20  20     Left ear:   20 20 20  20       Visual Acuity Screening   Right eye Left eye Both eyes  Without correction:     With correction: 20/25 20/40 20/25     Dietary issues and exercise activities discussed:     Goals   None    Depression Screen PHQ 2/9 Scores 12/23/2017 11/20/2017 10/23/2017 05/14/2017  PHQ - 2  Score 1 0 4 5  PHQ- 9 Score 2 1 14 15      Fall Risk Fall Risk  12/23/2017  Falls in the past year? No    Cognitive Function:  6CIT Screen 12/23/2017  What Year? 4 points  What month? 3 points  What time? 3 points  Count back from 20 4 points  Months in reverse 4 points  Repeat phrase 8 points  Total Score 26         6CIT Screen 12/23/2017  What Year? 4 points  What month? 3 points  What time? 3 points  Count back from 20 4 points  Months in reverse 4 points  Repeat phrase 8 points  Total Score 26    Patient Care Team: Particia Nearing, PA-C as PCP - General (Family Medicine)     Plan:     I have personally reviewed and noted the following in the patient's chart:   . Medical and  social history . Use of alcohol, tobacco or illicit drugs  . Current medications and supplements . Functional ability and status . Nutritional status . Physical activity . Advanced directives . List of other physicians . Hospitalizations, surgeries, and ER visits in previous 12 months . Vitals . Screenings to include cognitive, depression, and falls . Referrals and appointments  In addition, I have reviewed and discussed with patient certain preventive protocols, quality metrics, and best practice recommendations. A written personalized care plan for preventive services as well as general preventive health recommendations were provided to patient.     Particia Nearing, [place] 12/24/2017

## 2017-12-23 NOTE — Assessment & Plan Note (Addendum)
Improved on recheck to 140/86. High normal, pt requesting to continue to monitor and not increase medication just yet. Continue currnet regimen, work on salt restriction.

## 2017-12-23 NOTE — Patient Instructions (Signed)
Bone Densitometry Bone densitometry is an imaging test that uses a special X-ray to measure the amount of calcium and other minerals in your bones (bone density). This test is also known as a bone mineral density test or dual-energy X-ray absorptiometry (DXA). The test can measure bone density at your hip and your spine. It is similar to having a regular X-ray. You may have this test to:  Diagnose a condition that causes weak or thin bones (osteoporosis).  Predict your risk of a broken bone (fracture).  Determine how well osteoporosis treatment is working.  Tell a health care provider about:  Any allergies you have.  All medicines you are taking, including vitamins, herbs, eye drops, creams, and over-the-counter medicines.  Any problems you or family members have had with anesthetic medicines.  Any blood disorders you have.  Any surgeries you have had.  Any medical conditions you have.  Possibility of pregnancy.  Any other medical test you had within the previous 14 days that used contrast material. What are the risks? Generally, this is a safe procedure. However, problems can occur and may include the following:  This test exposes you to a very small amount of radiation.  The risks of radiation exposure may be greater to unborn children.  What happens before the procedure?  Do not take any calcium supplements for 24 hours before having the test. You can otherwise eat and drink what you usually do.  Take off all metal jewelry, eyeglasses, dental appliances, and any other metal objects. What happens during the procedure?  You may lie on an exam table. There will be an X-ray generator below you and an imaging device above you.  Other devices, such as boxes or braces, may be used to position your body properly for the scan.  You will need to lie still while the machine slowly scans your body.  The images will show up on a computer monitor. What happens after the  procedure? You may need more testing at a later time. This information is not intended to replace advice given to you by your health care provider. Make sure you discuss any questions you have with your health care provider. Document Released: 03/20/2004 Document Revised: 08/04/2015 Document Reviewed: 08/06/2013 Elsevier Interactive Patient Education  2018 [employer].  Fat and Cholesterol Restricted Diet High levels of fat and cholesterol in your blood may lead to various health problems, such as diseases of the heart, blood vessels, gallbladder, liver, and pancreas. Fats are concentrated sources of energy that come in various forms. Certain types of fat, including saturated fat, may be harmful in excess. Cholesterol is a substance needed by your body in small amounts. Your body makes all the cholesterol it needs. Excess cholesterol comes from the food you eat. When you have high levels of cholesterol and saturated fat in your blood, health problems can develop because the excess fat and cholesterol will gather along the walls of your blood vessels, causing them to narrow. Choosing the right foods will help you control your intake of fat and cholesterol. This will help keep the levels of these substances in your blood within normal limits and reduce your risk of disease. What is my plan? Your health care provider recommends that you:  Limit your fat intake to ______% or less of your total calories per day.  Limit the amount of cholesterol in your diet to less than _________mg per day.  Eat 20-30 grams of fiber each day.  What types of fat  should I choose?  Choose healthy fats more often. Choose monounsaturated and polyunsaturated fats, such as olive and canola oil, flaxseeds, walnuts, almonds, and seeds.  Eat more omega-3 fats. Good choices include salmon, mackerel, sardines, tuna, flaxseed oil, and ground flaxseeds. Aim to eat fish at least two times a week.  Limit saturated fats.  Saturated fats are primarily found in animal products, such as meats, butter, and cream. Plant sources of saturated fats include palm oil, palm kernel oil, and coconut oil.  Avoid foods with partially hydrogenated oils in them. These contain trans fats. Examples of foods that contain trans fats are stick margarine, some tub margarines, cookies, crackers, and other baked goods. What general guidelines do I need to follow? These guidelines for healthy eating will help you control your intake of fat and cholesterol:  Check food labels carefully to identify foods with trans fats or high amounts of saturated fat.  Fill one half of your plate with vegetables and green salads.  Fill one fourth of your plate with whole grains. Look for the word "whole" as the first word in the ingredient list.  Fill one fourth of your plate with lean protein foods.  Limit fruit to two servings a day. Choose fruit instead of juice.  Eat more foods that contain fiber, such as apples, broccoli, carrots, beans, peas, and barley.  Eat more home-cooked food and less restaurant, buffet, and fast food.  Limit or avoid alcohol.  Limit foods high in starch and sugar.  Limit fried foods.  Cook foods using methods other than frying. Baking, boiling, grilling, and broiling are all great options.  Lose weight if you are overweight. Losing just 5-10% of your initial body weight can help your overall health and prevent diseases such as diabetes and heart disease.  What foods can I eat? Grains  Whole grains, such as whole wheat or whole grain breads, crackers, cereals, and pasta. Unsweetened oatmeal, bulgur, barley, quinoa, or brown rice. Corn or whole wheat flour tortillas. Vegetables  Fresh or frozen vegetables (raw, steamed, roasted, or grilled). Green salads. Fruits  All fresh, canned (in natural juice), or frozen fruits. Meats and other protein foods  Ground beef (85% or leaner), grass-fed beef, or beef trimmed  of fat. Skinless chicken or [place]. Ground chicken or [place]. Pork trimmed of fat. All fish and seafood. Eggs. Dried beans, peas, or lentils. Unsalted nuts or seeds. Unsalted canned or dry beans. Dairy  Low-fat dairy products, such as skim or 1% milk, 2% or reduced-fat cheeses, low-fat ricotta or cottage cheese, or plain low-fat yo Fats and oils  Tub margarines without trans fats. Light or reduced-fat mayonnaise and salad dressings. Avocado. Olive, canola, sesame, or safflower oils. Natural peanut or almond butter (choose ones without added sugar and oil). The items listed above may not be a complete list of recommended foods or beverages. Contact your dietitian for more options. Foods to avoid Grains  White bread. White pasta. White rice. Cornbread. Bagels, pastries, and croissants. Crackers that contain trans fat. Vegetables  White potatoes. Corn. Creamed or fried vegetables. Vegetables in a cheese sauce. Fruits  Dried fruits. Canned fruit in light or heavy syrup. Fruit juice. Meats and other protein foods  Fatty cuts of meat. Ribs, chicken wings, bacon, sausage, bologna, salami, chitterlings, fatback, hot dogs, bratwurst, and packaged luncheon meats. Liver and organ meats. Dairy  Whole or 2% milk, cream, half-and-half, and cream cheese. Whole milk cheeses. Whole-fat or sweetened yogurt. Full-fat cheeses. Nondairy creamers and whipped toppings.  Processed cheese, cheese spreads, or cheese curds. Beverages  Alcohol. Sweetened drinks (such as sodas, lemonade, and fruit drinks or punches). Fats and oils  Butter, stick margarine, lard, shortening, ghee, or bacon fat. Coconut, palm kernel, or palm oils. Sweets and desserts  Corn syrup, sugars, honey, and molasses. Candy. Jam and jelly. Syrup. Sweetened cereals. Cookies, pies, cakes, donuts, muffins, and ice cream. The items listed above may not be a complete list of foods and beverages to avoid. Contact your dietitian for more  information. This information is not intended to replace advice given to you by your health care provider. Make sure you discuss any questions you have with your health care provider. Document Released: 02/26/2005 Document Revised: 03/19/2014 Document Reviewed: 05/27/2013 Elsevier Interactive Patient Education  [employer].

## 2017-12-24 DIAGNOSIS — R69 Illness, unspecified: Secondary | ICD-10-CM | POA: Diagnosis not present

## 2017-12-24 NOTE — Assessment & Plan Note (Signed)
Will decrease wellbutrin back to 150 mg as patient felt better at this dose. Encouraged her to restart counseling.

## 2017-12-26 ENCOUNTER — Other Ambulatory Visit: Payer: Self-pay

## 2017-12-26 ENCOUNTER — Other Ambulatory Visit: Payer: Self-pay | Admitting: Family Medicine

## 2017-12-26 MED ORDER — BUPROPION HCL ER (XL) 150 MG PO TB24: 150.0000 mg | ORAL_TABLET | Freq: Every day | ORAL | 1 refills | Status: DC

## 2017-12-26 NOTE — Telephone Encounter (Signed)
BP 173/82 at recent office visit 12/23/17; recheck BP same day: 140/86 Pt. scheduled for return visit in 05/2018.  Requested Prescriptions  Pending Prescriptions Disp Refills  . buPROPion (WELLBUTRIN XL) 150 MG 24 hr tablet [Pharmacy Med Name: BUPROPION HCL XL 150 MG TABLET] 90 tablet 1    Sig: TAKE 1 TABLET BY MOUTH EVERY DAY     Psychiatry: Antidepressants - bupropion Failed - 12/26/2017 12:44 PM      Failed - Last BP in normal range    BP Readings from Last 1 Encounters:  12/23/17 140/86         Passed - Completed PHQ-2 or PHQ-9 in the last 360 days.      Passed - Valid encounter within last 6 months    Recent Outpatient Visits          1 month ago Moderate episode of recurrent major depressive disorder [facility])   [facility] Roosvelt Maser [place], [place]   2 months ago Moderate episode of recurrent major depressive disorder [facility])   [facility] Roosvelt Maser [place], [place]   3 months ago Moderate episode of recurrent major depressive disorder [facility])   [facility] Roosvelt Maser [place], [place]   7 months ago Moderate episode of recurrent major depressive disorder [facility])   [facility] Particia Nearing, [place]   8 months ago Mixed hyperlipidemia   [facility] [place], Salley Hews, [place]      Future Appointments            In 4 months Maurice March, Salley Hews, PA-C [facility]   In 1 year [place], Salley Hews, PA-C [facility]

## 2017-12-26 NOTE — Telephone Encounter (Signed)
Attempted to send Rx of Bupropion 150 mg qd; # 90; RF x 1.  Initial electronic transmission to pharmacy failed.  Resent the above Rx; per message on prescription, receipt was confirmed by pharmacy at 1:05 PM.

## 2017-12-30 DIAGNOSIS — R69 Illness, unspecified: Secondary | ICD-10-CM | POA: Diagnosis not present

## 2018-05-12 ENCOUNTER — Other Ambulatory Visit: Payer: Self-pay | Admitting: Family Medicine

## 2018-05-12 NOTE — Telephone Encounter (Signed)
Requested medication (s) are due for refill today: yes  Requested medication (s) are on the active medication list: yes  Last refill:  11/20/17 for 90 tabs and 1 refill  Future visit scheduled: yes  Notes to clinic:  ACEI + Diuretic combos failed.  Requested Prescriptions  Pending Prescriptions Disp Refills   lisinopril-hydrochlorothiazide (PRINZIDE,ZESTORETIC) 20-12.5 MG tablet [Pharmacy Med Name: LISINOPRIL-HCTZ 20-12.5 MG TAB] 90 tablet 1    Sig: TAKE 1 TABLET BY MOUTH EVERY DAY     Cardiovascular:  ACEI + Diuretic Combos Failed - 05/12/2018  1:54 AM      Failed - Na in normal range and within 180 days    Sodium  Date Value Ref Range Status  09/20/2017 130 (L) 134 - 144 mmol/L Final         Failed - K in normal range and within 180 days    Potassium  Date Value Ref Range Status  09/20/2017 4.8 3.5 - 5.2 mmol/L Final         Failed - Cr in normal range and within 180 days    Creatinine, Ser  Date Value Ref Range Status  09/20/2017 0.70 0.57 - 1.00 mg/dL Final         Failed - Ca in normal range and within 180 days    Calcium  Date Value Ref Range Status  09/20/2017 9.7 8.7 - 10.3 mg/dL Final         Failed - Last BP in normal range    BP Readings from Last 1 Encounters:  12/23/17 140/86         Passed - Patient is not pregnant      Passed - Valid encounter within last 6 months    Recent Outpatient Visits          5 months ago Moderate episode of recurrent major depressive disorder [facility])   [facility] Particia Nearing, PA-C   6 months ago Moderate episode of recurrent major depressive disorder [facility])   [facility] Particia Nearing, [place]   7 months ago Moderate episode of recurrent major depressive disorder [facility])   [facility] Roosvelt Maser [place], [place]   12 months ago Moderate episode of recurrent major depressive disorder [facility])   [facility] Particia Nearing, [place]   1 year ago Mixed  hyperlipidemia   [facility] [place], Salley Hews, [place]      Future Appointments            In 1 week Maurice March, Salley Hews, PA-C [facility], [facility]   In 7 months Maurice March, Salley Hews, PA-C [facility], [facility]

## 2018-05-21 ENCOUNTER — Encounter: Payer: Self-pay | Admitting: Family Medicine

## 2018-05-21 ENCOUNTER — Other Ambulatory Visit: Payer: Self-pay

## 2018-05-21 ENCOUNTER — Ambulatory Visit (INDEPENDENT_AMBULATORY_CARE_PROVIDER_SITE_OTHER): Payer: Medicare Other | Admitting: Family Medicine

## 2018-05-21 VITALS — BP 130/84 | HR 67 | Temp 98.3°F | Ht 61.3 in | Wt 113.0 lb

## 2018-05-21 DIAGNOSIS — I10 Essential (primary) hypertension: Secondary | ICD-10-CM

## 2018-05-21 DIAGNOSIS — F339 Major depressive disorder, recurrent, unspecified: Secondary | ICD-10-CM | POA: Diagnosis not present

## 2018-05-21 MED ORDER — LISINOPRIL-HYDROCHLOROTHIAZIDE 20-12.5 MG PO TABS: 1.0000 | ORAL_TABLET | Freq: Every day | ORAL | 1 refills | Status: DC

## 2018-05-21 MED ORDER — BUPROPION HCL ER (XL) 300 MG PO TB24
ORAL_TABLET | ORAL | 1 refills | Status: DC
Start: 2018-05-21 — End: 2018-09-11

## 2018-05-21 NOTE — Progress Notes (Signed)
BP 130/84   Pulse 67   Temp 98.3 F (36.8 C) (Oral)   Ht 5' 1.3" (1.557 m)   Wt 113 lb (51.3 kg)   SpO2 98%   BMI 21.14 kg/m    Subjective:    Patient ID: Eileen Stewart, female    DOB: [birth year], 68 y.o.   MRN: [id number]  HPI: Eileen Stewart is a 68 y.o. female  Chief Complaint  Patient presents with  . Follow-up  . Depression  . Hypertension    Did miss 2 doses recently due to schedule changes   Here today for 6 month f/u.   Feels like her moods are doing very well on wellbutrin, feels things are steady. No Si/HI, sleep or appetite issues. Does want some referrals for counseling for a particular private issue she's dealing with. Does not wish to delve into details. Tried finding a Education officer, environmental to counsel her but has been unsuccessful so wanting to go a different route with things.   HTN - Not checking home BPs. Has not taken her medicine in 2 days due to a schedule change so being off her routine. Tries to be consistent with her medications otherwise. No CP, SOB, dizziness, HAs.   Depression screen [facility] 2/9 05/21/2018 12/23/2017 11/20/2017  Decreased Interest 1 0 0  Down, Depressed, Hopeless 1 1 0  PHQ - 2 Score 2 1 0  Altered sleeping 1 0 1  Tired, decreased energy 0 0 0  Change in appetite 0 0 0  Feeling bad or failure about yourself  1 0 0  Trouble concentrating 0 1 0  Moving slowly or fidgety/restless 0 0 0  Suicidal thoughts 0 0 0  PHQ-9 Score 4 2 1    GAD 7 : Generalized Anxiety Score 05/21/2018 11/20/2017 10/23/2017 09/22/2017  Nervous, Anxious, on Edge 0 1 3 3   Control/stop worrying 0 1 1 3   Worry too much - different things 1 1 3 3   Trouble relaxing 0 0 1 2  Restless 0 0 2 2  Easily annoyed or irritable 1 2 3 2   Afraid - awful might happen 0 0 3 3  Total GAD 7 Score 2 5 16 18   Anxiety Difficulty - - Very difficult Very difficult   Relevant past medical, surgical, family and social history reviewed and updated as indicated. Interim medical history since our last  visit reviewed. Allergies and medications reviewed and updated.  Review of Systems  Per HPI unless specifically indicated above     Objective:    BP 130/84   Pulse 67   Temp 98.3 F (36.8 C) (Oral)   Ht 5' 1.3" (1.557 m)   Wt 113 lb (51.3 kg)   SpO2 98%   BMI 21.14 kg/m   Wt Readings from Last 3 Encounters:  05/21/18 113 lb (51.3 kg)  12/23/17 112 lb 8 oz (51 kg)  11/20/17 111 lb 1.6 oz (50.4 kg)    Physical Exam Vitals signs and nursing note reviewed.  Constitutional:      Appearance: Normal appearance. She is not ill-appearing.  HENT:     Head: Atraumatic.  Eyes:     Extraocular Movements: Extraocular movements intact.     Conjunctiva/sclera: Conjunctivae normal.  Neck:     Musculoskeletal: Normal range of motion and neck supple.  Cardiovascular:     Rate and Rhythm: Normal rate and regular rhythm.     Heart sounds: Normal heart sounds.  Pulmonary:     Effort: Pulmonary effort is normal.  Breath sounds: Normal breath sounds.  Musculoskeletal: Normal range of motion.  Skin:    General: Skin is warm and dry.  Neurological:     Mental Status: She is alert and oriented to person, place, and time.  Psychiatric:        Mood and Affect: Mood normal.        Thought Content: Thought content normal.        Judgment: Judgment normal.     Results for orders placed or performed in visit on 05/21/18  Basic Metabolic Panel (BMET)  Result Value Ref Range   Glucose 77 65 - 99 mg/dL   BUN 9 8 - 27 mg/dL   Creatinine, Ser 2.83 0.57 - 1.00 mg/dL   GFR calc non Af Amer 74 >59 mL/min/1.73   GFR calc Af Amer 86 >59 mL/min/1.73   BUN/Creatinine Ratio 11 (L) 12 - 28   Sodium 136 134 - 144 mmol/L   Potassium 4.7 3.5 - 5.2 mmol/L   Chloride 95 (L) 96 - 106 mmol/L   CO2 25 20 - 29 mmol/L   Calcium 9.5 8.7 - 10.3 mg/dL      Assessment & Plan:   Problem List Items Addressed This Visit      Cardiovascular and Mediastinum   Hypertension    Strategies for medication  adherence reviewed, pt will continue striving for consistency taking her medicines. DASH diet, exercise, stress control reviewed. Stable and WNL, continue current regimen      Relevant Medications   lisinopril-hydrochlorothiazide (PRINZIDE,ZESTORETIC) 20-12.5 MG tablet   Other Relevant Orders   Basic Metabolic Panel (BMET) (Completed)     Other   Major depression, recurrent, chronic (HCC) - Primary    Moods doing well on wellbutrin, counseling packet given per her request for a private matter she's dealing with. Continue current regimen      Relevant Medications   buPROPion (WELLBUTRIN XL) 300 MG 24 hr tablet       Follow up plan: Return in about 6 months (around 11/21/2018) for CPE.

## 2018-05-22 LAB — BASIC METABOLIC PANEL
BUN / CREAT RATIO: 11 — AB (ref 12–28)
BUN: 9 mg/dL (ref 8–27)
CALCIUM: 9.5 mg/dL (ref 8.7–10.3)
CO2: 25 mmol/L (ref 20–29)
Chloride: 95 mmol/L — ABNORMAL LOW (ref 96–106)
Creatinine, Ser: 0.83 mg/dL (ref 0.57–1.00)
GFR, EST AFRICAN AMERICAN: 86 mL/min/{1.73_m2} (ref >59)
GFR, EST NON AFRICAN AMERICAN: 74 mL/min/{1.73_m2} (ref >59)
Glucose: 77 mg/dL (ref 65–99)
Potassium: 4.7 mmol/L (ref 3.5–5.2)
Sodium: 136 mmol/L (ref 134–144)

## 2018-05-23 ENCOUNTER — Encounter: Payer: Self-pay | Admitting: Family Medicine

## 2018-05-25 NOTE — Assessment & Plan Note (Signed)
Moods doing well on wellbutrin, counseling packet given per her request for a private matter she's dealing with. Continue current regimen

## 2018-05-25 NOTE — Assessment & Plan Note (Signed)
Strategies for medication adherence reviewed, pt will continue striving for consistency taking her medicines. DASH diet, exercise, stress control reviewed. Stable and WNL, continue current regimen

## 2018-08-01 ENCOUNTER — Telehealth: Payer: Self-pay | Admitting: Family Medicine

## 2018-08-01 ENCOUNTER — Other Ambulatory Visit: Payer: Self-pay

## 2018-08-01 ENCOUNTER — Encounter: Payer: Self-pay | Admitting: Family Medicine

## 2018-08-01 ENCOUNTER — Ambulatory Visit (INDEPENDENT_AMBULATORY_CARE_PROVIDER_SITE_OTHER): Payer: Medicare Other | Admitting: Family Medicine

## 2018-08-01 VITALS — Ht 61.0 in

## 2018-08-01 DIAGNOSIS — F339 Major depressive disorder, recurrent, unspecified: Secondary | ICD-10-CM | POA: Diagnosis not present

## 2018-08-01 MED ORDER — VORTIOXETINE HBR 5 MG PO TABS: 5.0000 mg | ORAL_TABLET | Freq: Every day | ORAL | 0 refills | Status: DC

## 2018-08-01 MED ORDER — VENLAFAXINE HCL ER 75 MG PO CP24: 75.0000 mg | ORAL_CAPSULE | Freq: Every day | ORAL | 0 refills | Status: DC

## 2018-08-01 NOTE — Telephone Encounter (Signed)
Can try effexor instead. Rx sent  Copied from CRM [id number]. Topic: General - Other >> Aug 01, 2018 10:26 AM Elliot Gault wrote: Patient had a virtual visit today with PCP and states  vortioxetine HBr (TRINTELLIX) 5 MG TABS tablet  out of pocket cost is expensive and would like to discuss alternate, please advise

## 2018-08-01 NOTE — Telephone Encounter (Signed)
Called patient no answer. Left detailed VM message (DPR reviewed) and advised patient to return call with any questions.

## 2018-08-01 NOTE — Assessment & Plan Note (Signed)
Will re-add trintellix and monitor closely for benefit. If no improvement, may try effexor. Work on establishing with a Veterinary surgeon in the community. F/u in 1 month

## 2018-08-01 NOTE — Progress Notes (Signed)
Ht 5\' 1"  (1.549 m)   BMI 21.35 kg/m    Subjective:    Patient ID: Eileen Stewart, female    DOB: 1951-01-16, 68 y.o.   MRN: [id number]  HPI: Eileen Stewart is a 68 y.o. female  Chief Complaint  Patient presents with  . Depression    medication change request wellbutrin    . This visit was completed via telephone due to the restrictions of the COVID-19 pandemic. All issues as above were discussed and addressed but no physical exam was performed. If it was felt that the patient should be evaluated in the office, they were directed there. The patient verbally consented to this visit. Patient was unable to complete an audio/visual visit due to Technical difficulties,Lack of internet. Due to the catastrophic nature of the COVID-19 pandemic, this visit was done through audio contact only. . Location of the patient: home . Location of the provider: work . Those involved with this call:  . Provider: Roosvelt Maser, PA-C . CMA: Elton Sin, CMA . Front Desk/Registration: Harriet Pho  . Time spent on call: 20 minutes on the phone discussing health concerns. 10 minutes total spent in review of patient's record and preparation of their chart. I verified patient identity using two factors (patient name and date of birth). Patient consents verbally to being seen via telemedicine visit today.   Has been struggling with moods lately due to living situation issues and recent deaths in the family. Crying spells, staying in bed all day some days. Has tried prozac and buspar previously without benefit. Previously on trintellix in addition to wellbutrin which seemed to do well for her but had cut back when she started feeling some better. Was set to have consultation with a therapist in town as COVID 19 hit and he then stopped taking new patients so appt was cancelled. Has not found another counselor yet. Denies SI/HI, panic attacks., severe mood swings.     Depression screen [facility] 2/9 08/01/2018  05/21/2018 12/23/2017  Decreased Interest 3 1 0  Down, Depressed, Hopeless 3 1 1   PHQ - 2 Score 6 2 1   Altered sleeping 3 1 0  Tired, decreased energy 3 0 0  Change in appetite 1 0 0  Feeling bad or failure about yourself  2 1 0  Trouble concentrating 0 0 1  Moving slowly or fidgety/restless 0 0 0  Suicidal thoughts 0 0 0  PHQ-9 Score 15 4 2   Difficult doing work/chores Extremely dIfficult - -   GAD 7 : Generalized Anxiety Score 08/01/2018 05/21/2018 11/20/2017 10/23/2017  Nervous, Anxious, on Edge 3 0 1 3  Control/stop worrying 3 0 1 1  Worry too much - different things 3 1 1 3   Trouble relaxing 1 0 0 1  Restless 2 0 0 2  Easily annoyed or irritable 3 1 2 3   Afraid - awful might happen 0 0 0 3  Total GAD 7 Score 15 2 5 16   Anxiety Difficulty Extremely difficult - - Very difficult     Relevant past medical, surgical, family and social history reviewed and updated as indicated. Interim medical history since our last visit reviewed. Allergies and medications reviewed and updated.  Review of Systems  Per HPI unless specifically indicated above     Objective:    Ht 5\' 1"  (1.549 m)   BMI 21.35 kg/m   Wt Readings from Last 3 Encounters:  05/21/18 113 lb (51.3 kg)  12/23/17 112 lb 8 oz (51 kg)  11/20/17 111 lb 1.6 oz (50.4 kg)    Physical Exam  Unable to perform virtual PE due to lack of video technology available to pt.   Results for orders placed or performed in visit on 05/21/18  Basic Metabolic Panel (BMET)  Result Value Ref Range   Glucose 77 65 - 99 mg/dL   BUN 9 8 - 27 mg/dL   Creatinine, Ser 1.610.83 0.57 - 1.00 mg/dL   GFR calc non Af Amer 74 >59 mL/min/1.73   GFR calc Af Amer 86 >59 mL/min/1.73   BUN/Creatinine Ratio 11 (L) 12 - 28   Sodium 136 134 - 144 mmol/L   Potassium 4.7 3.5 - 5.2 mmol/L   Chloride 95 (L) 96 - 106 mmol/L   CO2 25 20 - 29 mmol/L   Calcium 9.5 8.7 - 10.3 mg/dL      Assessment & Plan:   Problem List Items Addressed This Visit       Other   Major depression, recurrent, chronic (HCC) - Primary    Will re-add trintellix and monitor closely for benefit. If no improvement, may try effexor. Work on establishing with a Veterinary surgeoncounselor in the community. F/u in 1 month      Relevant Medications   vortioxetine HBr (TRINTELLIX) 5 MG TABS tablet       Follow up plan: Return in about 4 weeks (around 08/29/2018) for Depression f/u.

## 2018-08-22 ENCOUNTER — Telehealth: Payer: Self-pay | Admitting: Family Medicine

## 2018-08-22 NOTE — Telephone Encounter (Signed)
Copied from [place] [id number]. Topic: Quick Communication - See Telephone Encounter >> Aug 22, 2018 12:36 PM Blase Mess A wrote: CRM for notification. See Telephone encounter for: 08/22/18.  Patient is calling to see if she can get an appt to get her BP checked Please advise Thank you CB- [phone]

## 2018-08-25 NOTE — Telephone Encounter (Signed)
LVM for pt to call back.

## 2018-08-27 NOTE — Telephone Encounter (Signed)
LVM for pt to call back.

## 2018-09-11 ENCOUNTER — Encounter: Payer: Self-pay | Admitting: Family Medicine

## 2018-09-11 ENCOUNTER — Other Ambulatory Visit: Payer: Self-pay

## 2018-09-11 ENCOUNTER — Ambulatory Visit (INDEPENDENT_AMBULATORY_CARE_PROVIDER_SITE_OTHER): Payer: Medicare Other | Admitting: Family Medicine

## 2018-09-11 VITALS — BP 137/72 | HR 77 | Temp 98.2°F

## 2018-09-11 DIAGNOSIS — F339 Major depressive disorder, recurrent, unspecified: Secondary | ICD-10-CM

## 2018-09-11 MED ORDER — LISINOPRIL-HYDROCHLOROTHIAZIDE 20-12.5 MG PO TABS: 1.0000 | ORAL_TABLET | Freq: Every day | ORAL | 1 refills | Status: DC

## 2018-09-11 MED ORDER — BUPROPION HCL ER (XL) 300 MG PO TB24: ORAL_TABLET | ORAL | 1 refills | Status: DC

## 2018-09-11 MED ORDER — VENLAFAXINE HCL ER 75 MG PO CP24: 75.0000 mg | ORAL_CAPSULE | Freq: Every day | ORAL | 1 refills | Status: DC

## 2018-09-11 NOTE — Assessment & Plan Note (Signed)
Improved on effexor and wellbutrin regimen, continue current medications

## 2018-09-11 NOTE — Progress Notes (Signed)
BP 137/72   Pulse 77   Temp 98.2 F (36.8 C) (Oral)   SpO2 97%    Subjective:    Patient ID: Eileen Shave, female    DOB: 05-Apr-1950, 68 y.o.   MRN: [id number]  HPI: Eileen Stewart is a 68 y.o. female  Chief Complaint  Patient presents with  . Depression   Here today for 1 month mood f/u. Added effexor to wellbutrin regimen last visit. Still having some stress about her living situation but otherwise feeling much improved. No crying spells, mood lability, anhedonia. Denies side effects to the new additional medication.   Depression screen [facility] 2/9 08/01/2018 05/21/2018 12/23/2017  Decreased Interest 3 1 0  Down, Depressed, Hopeless 3 1 1   PHQ - 2 Score 6 2 1   Altered sleeping 3 1 0  Tired, decreased energy 3 0 0  Change in appetite 1 0 0  Feeling bad or failure about yourself  2 1 0  Trouble concentrating 0 0 1  Moving slowly or fidgety/restless 0 0 0  Suicidal thoughts 0 0 0  PHQ-9 Score 15 4 2   Difficult doing work/chores Extremely dIfficult - -   GAD 7 : Generalized Anxiety Score 08/01/2018 05/21/2018 11/20/2017 10/23/2017  Nervous, Anxious, on Edge 3 0 1 3  Control/stop worrying 3 0 1 1  Worry too much - different things 3 1 1 3   Trouble relaxing 1 0 0 1  Restless 2 0 0 2  Easily annoyed or irritable 3 1 2 3   Afraid - awful might happen 0 0 0 3  Total GAD 7 Score 15 2 5 16   Anxiety Difficulty Extremely difficult - - Very difficult   Relevant past medical, surgical, family and social history reviewed and updated as indicated. Interim medical history since our last visit reviewed. Allergies and medications reviewed and updated.  Review of Systems  Per HPI unless specifically indicated above     Objective:    BP 137/72   Pulse 77   Temp 98.2 F (36.8 C) (Oral)   SpO2 97%   Wt Readings from Last 3 Encounters:  05/21/18 113 lb (51.3 kg)  12/23/17 112 lb 8 oz (51 kg)  11/20/17 111 lb 1.6 oz (50.4 kg)    Physical Exam Vitals signs and nursing note reviewed.   Constitutional:      Appearance: Normal appearance. She is not ill-appearing.  HENT:     Head: Atraumatic.  Eyes:     Extraocular Movements: Extraocular movements intact.     Conjunctiva/sclera: Conjunctivae normal.  Neck:     Musculoskeletal: Normal range of motion and neck supple.  Cardiovascular:     Rate and Rhythm: Normal rate and regular rhythm.     Heart sounds: Normal heart sounds.  Pulmonary:     Effort: Pulmonary effort is normal.     Breath sounds: Normal breath sounds.  Musculoskeletal: Normal range of motion.  Skin:    General: Skin is warm and dry.  Neurological:     Mental Status: She is alert and oriented to person, place, and time.  Psychiatric:        Mood and Affect: Mood normal.        Thought Content: Thought content normal.        Judgment: Judgment normal.     Results for orders placed or performed in visit on 63/01/60  Basic Metabolic Panel (BMET)  Result Value Ref Range   Glucose 77 65 - 99 mg/dL   BUN 9  8 - 27 mg/dL   Creatinine, Ser 1.610.83 0.57 - 1.00 mg/dL   GFR calc non Af Amer 74 >59 mL/min/1.73   GFR calc Af Amer 86 >59 mL/min/1.73   BUN/Creatinine Ratio 11 (L) 12 - 28   Sodium 136 134 - 144 mmol/L   Potassium 4.7 3.5 - 5.2 mmol/L   Chloride 95 (L) 96 - 106 mmol/L   CO2 25 20 - 29 mmol/L   Calcium 9.5 8.7 - 10.3 mg/dL      Assessment & Plan:   Problem List Items Addressed This Visit      Other   Major depression, recurrent, chronic (HCC) - Primary    Improved on effexor and wellbutrin regimen, continue current medications       Relevant Medications   buPROPion (WELLBUTRIN XL) 300 MG 24 hr tablet   venlafaxine XR (EFFEXOR XR) 75 MG 24 hr capsule       Follow up plan: Return in about 6 months (around 03/14/2019) for CPE.

## 2018-10-29 ENCOUNTER — Telehealth: Payer: Self-pay | Admitting: Family Medicine

## 2018-10-29 NOTE — Telephone Encounter (Signed)
°  Relation to pt: self  Call back number: [phone]  Pharmacy: [facility], [place] [phone] (Phone) [phone] (Fax)     Reason for call:  Experiencing frequent urinationVaginal dis order when urinating and spotting

## 2018-10-29 NOTE — Telephone Encounter (Signed)
LVM for pt to call back for an appt. Also sending message to see if by chance something could be called in.

## 2018-10-31 ENCOUNTER — Encounter: Payer: Self-pay | Admitting: Family Medicine

## 2018-10-31 NOTE — Telephone Encounter (Signed)
Needs appointment

## 2018-10-31 NOTE — Telephone Encounter (Signed)
Called pt no answer °

## 2018-11-03 ENCOUNTER — Ambulatory Visit (INDEPENDENT_AMBULATORY_CARE_PROVIDER_SITE_OTHER): Payer: Medicare Other | Admitting: Nurse Practitioner

## 2018-11-03 ENCOUNTER — Encounter: Payer: Self-pay | Admitting: Nurse Practitioner

## 2018-11-03 ENCOUNTER — Other Ambulatory Visit (HOSPITAL_COMMUNITY)
Admission: RE | Admit: 2018-11-03 | Discharge: 2018-11-03 | Disposition: A | Discharge status: Home / Self Care | Payer: Medicare Other | Source: Ambulatory Visit | Attending: Nurse Practitioner | Admitting: Nurse Practitioner

## 2018-11-03 ENCOUNTER — Other Ambulatory Visit: Payer: Self-pay

## 2018-11-03 VITALS — BP 123/76 | HR 86 | Temp 98.9°F

## 2018-11-03 DIAGNOSIS — Z780 Asymptomatic menopausal state: Secondary | ICD-10-CM | POA: Insufficient documentation

## 2018-11-03 DIAGNOSIS — R319 Hematuria, unspecified: Secondary | ICD-10-CM | POA: Insufficient documentation

## 2018-11-03 DIAGNOSIS — Z1151 Encounter for screening for human papillomavirus (HPV): Secondary | ICD-10-CM | POA: Diagnosis not present

## 2018-11-03 DIAGNOSIS — Q6470 Unspecified congenital malformation of bladder and urethra: Secondary | ICD-10-CM | POA: Insufficient documentation

## 2018-11-03 MED ORDER — DOXYCYCLINE HYCLATE 100 MG PO TABS: 100.0000 mg | ORAL_TABLET | Freq: Two times a day (BID) | ORAL | 0 refills | Status: AC

## 2018-11-03 NOTE — Patient Instructions (Signed)
Abnormal Uterine Bleeding °Abnormal uterine bleeding means bleeding more than usual from your uterus. It can include: °· Bleeding between periods. °· Bleeding after sex. °· Bleeding that is heavier than normal. °· Periods that last longer than usual. °· Bleeding after you have stopped having your period (menopause). °There are many problems that may cause this. You should see a doctor for any kind of bleeding that is not normal. Treatment depends on the cause of the bleeding. °Follow these instructions at home: °· Watch your condition for any changes. °· Do not use tampons, douche, or have sex, if your doctor tells you not to. °· Change your pads often. °· Get regular well-woman exams. Make sure they include a pelvic exam and cervical cancer screening. °· Keep all follow-up visits as told by your doctor. This is important. °Contact a doctor if: °· The bleeding lasts more than one week. °· You feel dizzy at times. °· You feel like you are going to throw up (nauseous). °· You throw up. °Get help right away if: °· You pass out. °· You have to change pads every hour. °· You have belly (abdominal) pain. °· You have a fever. °· You get sweaty. °· You get weak. °· You passing large blood clots from your vagina. °Summary °· Abnormal uterine bleeding means bleeding more than usual from your uterus. °· There are many problems that may cause this. You should see a doctor for any kind of bleeding that is not normal. °· Treatment depends on the cause of the bleeding. °This information is not intended to replace advice given to you by your health care provider. Make sure you discuss any questions you have with your health care provider. °Document Released: 12/24/2008 Document Revised: 02/21/2016 Document Reviewed: 02/21/2016 °Elsevier Patient Education © 2020 [employer] ° °

## 2018-11-03 NOTE — Assessment & Plan Note (Signed)
Raised, cyst-like lesion noted to proximal aspect of urethral meatus.  Suspect this is cause of recent bleeding.  UA 3+ blood and 1+ LEU, otherwise WNL.  Urgent referral to GYN for further evaluation. Pap performed and sent.  Script for Doxycycline for prophylaxis at this time.  Reviewed POC with Dr. Wynetta Emery.  Return for ongoing issues or concerns.

## 2018-11-03 NOTE — Progress Notes (Addendum)
BP 123/76    Pulse 86    Temp 98.9 F (37.2 C) (Oral)    SpO2 98%    Subjective:    Patient ID: Eileen Stewart, female    DOB: 08/15/1950, 68 y.o.   MRN: [id number]  HPI: Eileen Stewart is a 68 y.o. female  Chief Complaint  Patient presents with   Urinary Tract Infection    pt states she has noticed blood are about 9 days    URINARY SYMPTOMS  Started about 9 days ago, noted blood in her urine that was like a period, but has now improved. Feels she noticed a cyst around urethral meatus when she assessed area, feels the blood she has noticed may be coming from here for vagina.  Denies any pain to the area.  [facility]  LMP was several years ago per her report.  Denies any recent weight loss or N&V. Dysuria: no Urinary frequency: no   Urgency: no Small volume voids: no, denies difficulty voiding Symptom severity: no Urinary incontinence: no Foul odor: no Hematuria: yes Abdominal pain: no Back pain: no Suprapubic pain/pressure: no Flank pain: no Fever:  no Vomiting: no Relief with cranberry juice: yes Relief with pyridium: no Status: stable Previous urinary tract infection: over a year or two ago per her report Recurrent urinary tract infection: no Sexual activity: No sexually active History of sexually transmitted disease: no Treatments attempted: cranberry and increasing fluids   Relevant past medical, surgical, family and social history reviewed and updated as indicated. Interim medical history since our last visit reviewed. Allergies and medications reviewed and updated.  Review of Systems  Constitutional: Negative for activity change, appetite change, diaphoresis, fatigue and fever.  Respiratory: Negative for cough, chest tightness and shortness of breath.   Cardiovascular: Negative for chest pain, palpitations and leg swelling.  Gastrointestinal: Negative for abdominal distention, abdominal pain, constipation, diarrhea, nausea and  vomiting.  Genitourinary: Positive for vaginal bleeding. Negative for decreased urine volume, dysuria, frequency, urgency, vaginal discharge and vaginal pain.  Psychiatric/Behavioral: Negative.     Per HPI unless specifically indicated above     Objective:    BP 123/76    Pulse 86    Temp 98.9 F (37.2 C) (Oral)    SpO2 98%   Wt Readings from Last 3 Encounters:  05/21/18 113 lb (51.3 kg)  12/23/17 112 lb 8 oz (51 kg)  11/20/17 111 lb 1.6 oz (50.4 kg)    Physical Exam Vitals signs and nursing note reviewed.  Constitutional:      General: She is awake. She is not in acute distress.    Appearance: She is well-developed. She is not ill-appearing.  HENT:     Head: Normocephalic.     Right Ear: Hearing normal.     Left Ear: Hearing normal.  Eyes:     General: Lids are normal.        Right eye: No discharge.        Left eye: No discharge.     Conjunctiva/sclera: Conjunctivae normal.     Pupils: Pupils are equal, round, and reactive to light.  Neck:     Musculoskeletal: Normal range of motion and neck supple.  Cardiovascular:     Rate and Rhythm: Normal rate and regular rhythm.     Heart sounds: Normal heart sounds. No murmur. No gallop.   Pulmonary:     Effort: Pulmonary effort is normal. No accessory muscle usage or respiratory distress.  Breath sounds: Normal breath sounds.  Abdominal:     General: Bowel sounds are normal.     Palpations: Abdomen is soft.  Genitourinary:    Exam position: Lithotomy position.     Cervix: Friability present. No discharge.     Uterus: Normal.      Adnexa: Right adnexa normal and left adnexa normal.       Comments: Cervix anterior with slight friability on pap exam.  Moderate erythema noted to bilateral vaginal walls. Musculoskeletal:     Right lower leg: No edema.     Left lower leg: No edema.  Skin:    General: Skin is warm and dry.  Neurological:     Mental Status: She is alert and oriented to person, place, and time.    Psychiatric:        Attention and Perception: Attention normal.        Mood and Affect: Mood normal.        Behavior: Behavior normal. Behavior is cooperative.        Thought Content: Thought content normal.        Judgment: Judgment normal.     Results for orders placed or performed in visit on 11/03/18  Microscopic Examination   URINE  Result Value Ref Range   WBC, UA 0-5 0 - 5 /hpf   RBC >30 (A) 0 - 2 /hpf   Epithelial Cells (non renal) 0-10 0 - 10 /hpf   Bacteria, UA None seen None seen/Few  Urine Culture, Reflex   URINE  Result Value Ref Range   Urine Culture, Routine WILL FOLLOW   UA/M w/rflx Culture, Routine   Specimen: Urine   URINE  Result Value Ref Range   Specific Gravity, UA 1.015 1.005 - 1.030   pH, UA 6.0 5.0 - 7.5   Color, UA Yellow Yellow   Appearance Ur Hazy (A) Clear   Leukocytes,UA 1+ (A) Negative   Protein,UA Negative Negative/Trace   Glucose, UA Negative Negative   Ketones, UA Negative Negative   RBC, UA 3+ (A) Negative   Bilirubin, UA Negative Negative   Urobilinogen, Ur 0.2 0.2 - 1.0 mg/dL   Nitrite, UA Negative Negative   Microscopic Examination See below:    Urinalysis Reflex Comment       Assessment & Plan:   Problem List Items Addressed This Visit      Genitourinary   Abnormality of urethral meatus    Raised, cyst-like lesion noted to proximal aspect of urethral meatus.  Suspect this is cause of recent bleeding.  UA 3+ blood and 1+ LEU, otherwise WNL.  Urgent referral to GYN for further evaluation. Pap performed and sent.  Script for Doxycycline for prophylaxis at this time.  Reviewed POC with Dr. Wynetta Emery.  Return for ongoing issues or concerns.       Other Visit Diagnoses    Hematuria, unspecified type    -  Primary   Relevant Orders   UA/M w/rflx Culture, Routine (Completed)   Cytology - PAP   Ambulatory referral to Gynecology       Follow up plan: Return if symptoms worsen or fail to improve.

## 2018-11-05 ENCOUNTER — Encounter: Payer: Self-pay | Admitting: Obstetrics & Gynecology

## 2018-11-05 ENCOUNTER — Ambulatory Visit (INDEPENDENT_AMBULATORY_CARE_PROVIDER_SITE_OTHER): Payer: Medicare Other | Admitting: Obstetrics & Gynecology

## 2018-11-05 ENCOUNTER — Other Ambulatory Visit: Payer: Self-pay

## 2018-11-05 VITALS — BP 140/80 | Ht 61.0 in | Wt 115.0 lb

## 2018-11-05 DIAGNOSIS — N950 Postmenopausal bleeding: Secondary | ICD-10-CM

## 2018-11-05 DIAGNOSIS — N369 Urethral disorder, unspecified: Secondary | ICD-10-CM

## 2018-11-05 DIAGNOSIS — N368 Other specified disorders of urethra: Secondary | ICD-10-CM | POA: Diagnosis not present

## 2018-11-05 LAB — UA/M W/RFLX CULTURE, ROUTINE
Bilirubin, UA: NEGATIVE
Glucose, UA: NEGATIVE
Ketones, UA: NEGATIVE
Nitrite, UA: NEGATIVE
Protein,UA: NEGATIVE
Specific Gravity, UA: 1.015 (ref 1.005–1.030)
Urobilinogen, Ur: 0.2 mg/dL (ref 0.2–1.0)
pH, UA: 6 (ref 5.0–7.5)

## 2018-11-05 LAB — URINE CULTURE, REFLEX

## 2018-11-05 LAB — CYTOLOGY - PAP
Diagnosis: NEGATIVE
HPV: NOT DETECTED

## 2018-11-05 LAB — MICROSCOPIC EXAMINATION
Bacteria, UA: NONE SEEN
RBC: >30 /hpf — AB (ref 0–2)

## 2018-11-05 NOTE — Progress Notes (Signed)
Consultant: Marnee Guarneri, NP Reason: bleeding  Postmenopausal Bleeding Patient is a 68 yo G2P2 WF who complains of vaginal bleeding. She has been menopausal for several years. Currently on no HRT and no blood thinner meds. Bleeding is described as flow about like a period and has occurred starting 10 days ago and somewhat continuous.  Has looked and noticed a urethral anomaly herself, and also has seen PCP with similar concerns.  NSVD x2, no prior fibroids or other Gyn abnormalities.   Other menopausal symptoms include: none. Workup to date: PAP yesterday of cervix..  Menstrual History: OB History    Gravida  2   Para  2   Term  2   Preterm      AB      Living  2     SAB      TAB      Ectopic      Multiple      Live Births              No LMP recorded. Patient is postmenopausal.     PMHx: She  has a past medical history of Acute pyelonephritis, Alcohol abuse, Anemia, Asymptomatic varicose veins, unspecified laterality, Depression, Essential tremor, Hyperlipidemia, Hypertension, Liver mass, and Vitamin D deficiency. Also,  has a past surgical history that includes Splenectomy., family history includes Alcohol abuse in her father; Anxiety disorder in her sister; Depression in her sister; Hyperlipidemia in her mother; Hypertension in her mother; Parkinson's disease in her cousin.,  reports that she has never smoked. She has never used smokeless tobacco. She reports that she does not drink alcohol or use drugs.  She has a current medication list which includes the following prescription(s): aspirin ec, bupropion, calcium carbonate-vitamin d, doxycycline, lisinopril-hydrochlorothiazide, and venlafaxine xr. Also, has No Known Allergies.  Review of Systems  Constitutional: Negative for chills, fever and malaise/fatigue.  HENT: Negative for congestion, sinus pain and sore throat.   Eyes: Negative for blurred vision and pain.  Respiratory: Negative for cough and wheezing.    Cardiovascular: Negative for chest pain and leg swelling.  Gastrointestinal: Negative for abdominal pain, constipation, diarrhea, heartburn, nausea and vomiting.  Genitourinary: Negative for dysuria, frequency, hematuria and urgency.  Musculoskeletal: Negative for back pain, joint pain, myalgias and neck pain.  Skin: Negative for itching and rash.  Neurological: Negative for dizziness, tremors and weakness.  Endo/Heme/Allergies: Does not bruise/bleed easily.  Psychiatric/Behavioral: Negative for depression. The patient is not nervous/anxious and does not have insomnia.     Objective: BP 140/80   Ht 5\' 1"  (1.549 m)   Wt 115 lb (52.2 kg)   BMI 21.73 kg/m  Physical Exam Constitutional:      General: She is not in acute distress.    Appearance: She is well-developed.  Genitourinary:     Pelvic exam was performed with patient supine.     Vulva, inguinal canal, bladder, vagina and uterus normal.     Urethral prolapse and swelling present.     No vaginal erythema or bleeding.     No cervical motion tenderness, discharge, polyp or nabothian cyst.     Uterus is mobile.     Uterus is not enlarged.     No uterine mass detected.    Uterus is midaxial.     No right or left adnexal mass present.     Right adnexa not tender.     Left adnexa not tender.     Genitourinary Comments: Cervix normal ,small os Urethral  eversion with redened tissue noted, prolapsable, scant spotting with touch to it Mod vag atrophy, no vag lesions  HENT:     Head: Normocephalic and atraumatic.     Nose: Nose normal.  Abdominal:     General: There is no distension.     Palpations: Abdomen is soft.     Tenderness: There is no abdominal tenderness.  Musculoskeletal: Normal range of motion.  Neurological:     Mental Status: She is alert and oriented to person, place, and time.     Cranial Nerves: No cranial nerve deficit.  Skin:    General: Skin is warm and dry.     ASSESSMENT/PLAN:     ICD-10-CM   1.  Post-menopausal bleeding  N95.0 US PELVIC COMPLETE WITH TRANSVAGINAL  2. Urethral bleeding  N36.8     Ambulatory referral to Urology  3. Urethral disorder  N36.9     Ambulatory referral to Urology  Referral to urology US to assess GYN etiology for PMB (although likely urethral in origin) EMB if abn US Await PAP results from PCP   Annamarie MajorPaul Sabria Florido, MD, Merlinda FrederickFACOG [facility], [facility] 11/05/2018  1:38 PM

## 2018-11-05 NOTE — Patient Instructions (Signed)
  Postmenopausal Bleeding  Postmenopausal bleeding is any bleeding that a woman has after she has entered into menopause. Menopause is the end of a woman's fertile years. After menopause, a woman no longer ovulates and does not have menstrual periods. Postmenopausal bleeding may have various causes, including:  Menopausal hormone therapy (MHT).  Endometrial atrophy. After menopause, low estrogen hormone levels cause the membrane that lines the uterus (endometrium) to become thinner. You may have bleeding as the endometrium thins.  Endometrial hyperplasia. This condition is caused by excess estrogen hormones and low levels of progesterone hormones. The excess estrogen causes the endometrium to thicken, which can lead to bleeding. In some cases, this can lead to cancer of the uterus.  Endometrial cancer.  Non-cancerous growths (polyps) on the endometrium, the lining of the uterus, or the cervix.  Uterine fibroids. These are non-cancerous growths in or around the uterus muscle tissue that can cause heavy bleeding. Any type of postmenopausal bleeding, even if it appears to be a typical menstrual period, should be evaluated by your health care provider. Treatment will depend on the cause of the bleeding. Follow these instructions at home:  Pay attention to any changes in your symptoms.  Avoid using tampons and douches as told by your health care provider.  Change your pads regularly.  Get regular pelvic exams and Pap tests.  Take iron supplements as told by your health care provider.  Take over-the-counter and prescription medicines only as told by your health care provider.  Keep all follow-up visits as told by your health care provider. This is important. Contact a health care provider if:  Your bleeding lasts more than 1 week.  You have abdominal pain.  You have bleeding with or after sexual intercourse.  You have bleeding that happens more often than every 3 weeks. Get help  right away if:  You have a fever, chills, headache, dizziness, muscle aches, and bleeding.  You have severe pain with bleeding.  You are passing blood clots.  You have heavy bleeding, need more than 1 pad an hour, and have never experienced this before.  You feel faint. Summary  Postmenopausal bleeding is any bleeding that a woman has after she has entered into menopause.  Postmenopausal bleeding may have various causes. Treatment will depend on the cause of the bleeding.  Any type of postmenopausal bleeding, even if it appears to be a typical menstrual period, should be evaluated by your health care provider.  Be sure to pay attention to any changes in your symptoms and keep all follow-up visits as told by your health care provider. This information is not intended to replace advice given to you by your health care provider. Make sure you discuss any questions you have with your health care provider. Document Released: 06/06/2005 Document Revised: 06/05/2017 Document Reviewed: 05/22/2016 Elsevier Patient Education  2020 [employer] Inc[employer]  

## 2018-11-12 ENCOUNTER — Other Ambulatory Visit: Payer: Self-pay

## 2018-11-12 ENCOUNTER — Ambulatory Visit: Payer: Medicare Other | Admitting: Urology

## 2018-11-12 VITALS — BP 126/72 | HR 79 | Ht 62.0 in | Wt 116.0 lb

## 2018-11-12 DIAGNOSIS — N950 Postmenopausal bleeding: Secondary | ICD-10-CM | POA: Diagnosis not present

## 2018-11-12 DIAGNOSIS — N362 Urethral caruncle: Secondary | ICD-10-CM | POA: Diagnosis not present

## 2018-11-12 DIAGNOSIS — R3129 Other microscopic hematuria: Secondary | ICD-10-CM

## 2018-11-12 LAB — URINALYSIS, COMPLETE
Bilirubin, UA: NEGATIVE
Glucose, UA: NEGATIVE
Ketones, UA: NEGATIVE
Leukocytes,UA: NEGATIVE
Nitrite, UA: NEGATIVE
Protein,UA: NEGATIVE
Specific Gravity, UA: 1.02 (ref 1.005–1.030)
Urobilinogen, Ur: 0.2 mg/dL (ref 0.2–1.0)
pH, UA: 5.5 (ref 5.0–7.5)

## 2018-11-12 LAB — MICROSCOPIC EXAMINATION
Bacteria, UA: NONE SEEN
Epithelial Cells (non renal): NONE SEEN /hpf (ref 0–10)

## 2018-11-12 MED ORDER — PREMARIN 0.625 MG/GM VA CREA: 1.0000 | TOPICAL_CREAM | Freq: Every day | VAGINAL | 12 refills | Status: DC

## 2018-11-12 NOTE — Patient Instructions (Signed)
Cystoscopy Cystoscopy is a procedure that is used to help diagnose and sometimes treat conditions that affect the lower urinary tract. The lower urinary tract includes the bladder and the urethra. The urethra is the tube that drains urine from the bladder. Cystoscopy is done using a thin, tube-shaped instrument with a light and camera at the end (cystoscope). The cystoscope may be hard or flexible, depending on the goal of the procedure. The cystoscope is inserted through the urethra, into the bladder. Cystoscopy may be recommended if you have:  Urinary tract infections that keep coming back.  Blood in the urine (hematuria).  An inability to control when you urinate (urinary incontinence) or an overactive bladder.  Unusual cells found in a urine sample.  A blockage in the urethra, such as a urinary stone.  Painful urination.  An abnormality in the bladder found during an intravenous pyelogram (IVP) or CT scan. Cystoscopy may also be done to remove a sample of tissue to be examined under a microscope (biopsy). Tell a health care provider about:  Any allergies you have.  All medicines you are taking, including vitamins, herbs, eye drops, creams, and over-the-counter medicines.  Any problems you or family members have had with anesthetic medicines.  Any blood disorders you have.  Any surgeries you have had.  Any medical conditions you have.  Whether you are pregnant or may be pregnant. What are the risks? Generally, this is a safe procedure. However, problems may occur, including:  Infection.  Bleeding.  Allergic reactions to medicines.  Damage to other structures or organs. What happens before the procedure?  Ask your health care provider about: ? Changing or stopping your regular medicines. This is especially important if you are taking diabetes medicines or blood thinners. ? Taking medicines such as aspirin and ibuprofen. These medicines can thin your blood. Do not take  these medicines unless your health care provider tells you to take them. ? Taking over-the-counter medicines, vitamins, herbs, and supplements.  Follow instructions from your health care provider about eating or drinking restrictions.  Ask your health care provider what steps will be taken to help prevent infection. These may include: ? Washing skin with a germ-killing soap. ? Taking antibiotic medicine.  You may have an exam or testing, such as: ? X-rays of the bladder, urethra, or kidneys. ? Urine tests to check for signs of infection.  Plan to have someone take you home from the hospital or clinic. What happens during the procedure?   You will be given one or more of the following: ? A medicine to help you relax (sedative). ? A medicine to numb the area (local anesthetic).  The area around the opening of your urethra will be cleaned.  The cystoscope will be passed through your urethra into your bladder.  Germ-free (sterile) fluid will flow through the cystoscope to fill your bladder. The fluid will stretch your bladder so that your health care provider can clearly examine your bladder walls.  Your doctor will look at the urethra and bladder. Your doctor may take a biopsy or remove stones.  The cystoscope will be removed, and your bladder will be emptied. The procedure may vary among health care providers and hospitals. What can I expect after the procedure? After the procedure, it is common to have:  Some soreness or pain in your abdomen and urethra.  Urinary symptoms. These include: ? Mild pain or burning when you urinate. Pain should stop within a few minutes after you urinate. This   may last for up to 1 week. ? A small amount of blood in your urine for several days. ? Feeling like you need to urinate but producing only a small amount of urine. Follow these instructions at home: Medicines  Take over-the-counter and prescription medicines only as told by your health care  provider.  If you were prescribed an antibiotic medicine, take it as told by your health care provider. Do not stop taking the antibiotic even if you start to feel better. General instructions  Return to your normal activities as told by your health care provider. Ask your health care provider what activities are safe for you.  Do not drive for 24 hours if you were given a sedative during your procedure.  Watch for any blood in your urine. If the amount of blood in your urine increases, call your health care provider.  Follow instructions from your health care provider about eating or drinking restrictions.  If a tissue sample was removed for testing (biopsy) during your procedure, it is up to you to get your test results. Ask your health care provider, or the department that is doing the test, when your results will be ready.  Drink enough fluid to keep your urine pale yellow.  Keep all follow-up visits as told by your health care provider. This is important. Contact a health care provider if you:  Have pain that gets worse or does not get better with medicine, especially pain when you urinate.  Have trouble urinating.  Have more blood in your urine. Get help right away if you:  Have blood clots in your urine.  Have abdominal pain.  Have a fever or chills.  Are unable to urinate. Summary  Cystoscopy is a procedure that is used to help diagnose and sometimes treat conditions that affect the lower urinary tract.  Cystoscopy is done using a thin, tube-shaped instrument with a light and camera at the end.  After the procedure, it is common to have some soreness or pain in your abdomen and urethra.  Watch for any blood in your urine. If the amount of blood in your urine increases, call your health care provider.  If you were prescribed an antibiotic medicine, take it as told by your health care provider. Do not stop taking the antibiotic even if you start to feel better. This  information is not intended to replace advice given to you by your health care provider. Make sure you discuss any questions you have with your health care provider. Document Released: 02/24/2000 Document Revised: 02/18/2018 Document Reviewed: 02/18/2018 Elsevier Patient Education  2020 [employer]  

## 2018-11-12 NOTE — Progress Notes (Signed)
11/12/2018 2:35 PM   Eileen Stewart 02-28-1951 [id number]  Referring provider: Gae Dry, MD [address] [place],  [place] [address]  Chief Complaint  Patient presents with  . Other    Urethral bleeding    HPI: 68 year old female who presents today for further evaluation of urethral bleeding/urethral mass.  About 2 weeks ago, she began experiencing bleeding which she initially thought was a vaginal.  She reports that it was as heavy as a period.  She is wearing a pad for this.  The bleeding lasted about a week and slowly started to subside.  She noticed this on tissue with wiping.  She was seen and evaluated by a PA at [facility], Marnee Guarneri, who noted a "cyst like" raised lesion involving her urethra.  Her urinalysis at the time showed 3+ blood.  She was referred to GYN.  She was then seen and evaluated by Dr. Kenton Kingfisher 2 days later for pelvic exam and further evaluation of postmenopausal bleeding.  The same lesion was noted which he felt was possible urethral prolapse.  She was referred to urology further evaluation.  She does have a pelvic ultrasound scheduled.  The patient notes that she did do a self-exam with a mirror several days after symptoms started.  She noticed the area around her urethra which was irritated, swollen which she describes as a cyst with a black spot in the middle of it.  It bled with a manipulation.  She reports that over the past week or so, it seems of gotten smaller and is no longer bleeding.  The irritation is also improved.  Urinalysis consistent he is to have 3-10 red blood cells per high-powered field today, otherwise unremarkable.  She is a never smoker.  No environmental exposures.  She denies any dysuria, urgency frequency or leakage.   PMH: Past Medical History:  Diagnosis Date  . Acute pyelonephritis   . Alcohol abuse    Alcohol, remission since 2013  . Anemia   . Asymptomatic varicose veins, unspecified laterality    . Depression   . Essential tremor   . Hyperlipidemia   . Hypertension   . Liver mass   . Vitamin D deficiency     Surgical History: Past Surgical History:  Procedure Laterality Date  . SPLENECTOMY      Home Medications:  Allergies as of 11/12/2018   No Known Allergies     Medication List       Accurate as of November 12, 2018  2:35 PM. If you have any questions, ask your nurse or doctor.        aspirin EC 81 MG tablet Take by mouth.   buPROPion 300 MG 24 hr tablet Commonly known as: WELLBUTRIN XL TK 1 T PO D   CALCIUM 500/D PO Take by mouth.   lisinopril-hydrochlorothiazide 20-12.5 MG tablet Commonly known as: ZESTORETIC Take 1 tablet by mouth daily.   Premarin vaginal cream Generic drug: conjugated estrogens Place 1 Applicatorful vaginally daily. Use pea sized amount M-W-Fr before bedtime Started by: Hollice Espy, MD   venlafaxine XR 75 MG 24 hr capsule Commonly known as: Effexor XR Take 1 capsule (75 mg total) by mouth daily with breakfast.       Allergies: No Known Allergies  Family History: Family History  Problem Relation Age of Onset  . Hyperlipidemia Mother   . Hypertension Mother   . Alcohol abuse Father   . Depression Sister   . Anxiety disorder Sister   .  Parkinson's disease Cousin     Social History:  reports that she has never smoked. She has never used smokeless tobacco. She reports that she does not drink alcohol or use drugs.  ROS: UROLOGY Frequent Urination?: No Hard to postpone urination?: Yes Burning/pain with urination?: No Get up at night to urinate?: No Leakage of urine?: Yes Urine stream starts and stops?: No Trouble starting stream?: No Do you have to strain to urinate?: No Blood in urine?: Yes Urinary tract infection?: No Sexually transmitted disease?: No Injury to kidneys or bladder?: No Painful intercourse?: No Weak stream?: No Currently pregnant?: No Vaginal bleeding?: Yes Last menstrual period?: n   Gastrointestinal Nausea?: No Vomiting?: No Indigestion/heartburn?: Yes Diarrhea?: Yes Constipation?: Yes  Constitutional Fever: No Night sweats?: No Weight loss?: No Fatigue?: No  Skin Skin rash/lesions?: No Itching?: No  Eyes Blurred vision?: No Double vision?: No  Ears/Nose/Throat Sore throat?: No Sinus problems?: No  Hematologic/Lymphatic Swollen glands?: No Easy bruising?: Yes  Cardiovascular Leg swelling?: No Chest pain?: No  Respiratory Cough?: No Shortness of breath?: No  Endocrine Excessive thirst?: No  Musculoskeletal Back pain?: No Joint pain?: No  Neurological Headaches?: No Dizziness?: Yes  Psychologic Depression?: Yes Anxiety?: Yes  Physical Exam: BP 126/72   Pulse 79   Ht 5\' 2"  (1.575 m)   Wt 116 lb (52.6 kg)   BMI 21.22 kg/m   Constitutional:  Alert and oriented, No acute distress.  Chaperoned by Teressa Lowerarrie Garrison, CMA. HEENT: [place] AT, moist mucus membranes.  Trachea midline, no masses. Cardiovascular: No clubbing, cyanosis, or edema. Respiratory: Normal respiratory effort, no increased work of breathing. GI: Abdomen is soft, nontender, nondistended, no abdominal masses Pelvic: 1 cm area at the 6 clock position of the urethra which is bilobed raised, erythematous with a punctate hemorrhagic dark spot.  The urethral meatus was prepped using Betadine solution and intubated using a 12 French red rubber to ensure that the lesion was in fact arising from the distalmost aspect of the 6 o'clock position of the urethra.  Mild vaginal atrophy otherwise appreciated. Skin: No rashes, bruises or suspicious lesions. Neurologic: Grossly intact, no focal deficits, moving all 4 extremities. Psychiatric: Normal mood and affect.  Laboratory Data:  Lab Results  Component Value Date   CREATININE 0.83 05/21/2018   Urinalysis UA today with 3-10 white blood cells per high-power field, otherwise unremarkable.   Assessment & Plan:    1. Urethral  caruncle Exam findings today most consistent with inflamed/irritated urethral caruncle  We discussed that this is a benign lesion and often responds to topical estrogen cream.  If it fails to respond, could consider surgical excision.  She was given literature today as a print out about this lesion and treatment options.  She was given a sample of topical estrogen cream, advised to use this 3 times a week, pea-sized amount directly on the urethral meatus, wash hands shortly after applying before bedtime.  I recommended that she return in about 6 weeks for reevaluation/close follow-up.  - Urinalysis, Complete  2. Microscopic hematuria Likely secondary to inflamed/irritated urethral caruncle  We will recheck her urine next visit.  Consider hematuria evaluation if hematuria fails to resolve with improvement of her caruncle.  3. Post-menopausal bleeding Scheduled for pelvic ultrasound  Return in about 6 weeks (around 12/24/2018) for Pelvic exam/possible cystoscopy and UA.  Vanna ScotlandAshley Inari Shin, MD  [facility] [address], [address] [place], [place] [address] [phone]

## 2018-11-19 ENCOUNTER — Ambulatory Visit (INDEPENDENT_AMBULATORY_CARE_PROVIDER_SITE_OTHER): Payer: Medicare Other | Admitting: Obstetrics & Gynecology

## 2018-11-19 ENCOUNTER — Encounter: Payer: Self-pay | Admitting: Obstetrics & Gynecology

## 2018-11-19 ENCOUNTER — Other Ambulatory Visit (HOSPITAL_COMMUNITY)
Admission: RE | Admit: 2018-11-19 | Discharge: 2018-11-19 | Disposition: A | Discharge status: Home / Self Care | Payer: Medicare Other | Source: Ambulatory Visit | Attending: Obstetrics & Gynecology | Admitting: Obstetrics & Gynecology

## 2018-11-19 ENCOUNTER — Ambulatory Visit (INDEPENDENT_AMBULATORY_CARE_PROVIDER_SITE_OTHER): Payer: Medicare Other

## 2018-11-19 ENCOUNTER — Other Ambulatory Visit: Payer: Self-pay

## 2018-11-19 VITALS — BP 150/90 | Ht 61.0 in | Wt 117.0 lb

## 2018-11-19 DIAGNOSIS — N368 Other specified disorders of urethra: Secondary | ICD-10-CM

## 2018-11-19 DIAGNOSIS — N950 Postmenopausal bleeding: Secondary | ICD-10-CM | POA: Diagnosis not present

## 2018-11-19 DIAGNOSIS — R9389 Abnormal findings on diagnostic imaging of other specified body structures: Secondary | ICD-10-CM | POA: Insufficient documentation

## 2018-11-19 DIAGNOSIS — N369 Urethral disorder, unspecified: Secondary | ICD-10-CM

## 2018-11-19 NOTE — Progress Notes (Signed)
  HPI: Pt has a urethral caruncle as likely source to some recent bleeding she has had.  She is post menopausal.  No prior bleeding concerns.  No HRT or other meds that could lead to bleeding.  Ultrasound demonstrates no masses seen, ES of 5.5 mm with some heterogeneous characteristics  PMHx: She  has a past medical history of Acute pyelonephritis, Alcohol abuse, Anemia, Asymptomatic varicose veins, unspecified laterality, Depression, Essential tremor, Hyperlipidemia, Hypertension, Liver mass, and Vitamin D deficiency. Also,  has a past surgical history that includes Splenectomy., family history includes Alcohol abuse in her father; Anxiety disorder in her sister; Depression in her sister; Hyperlipidemia in her mother; Hypertension in her mother; Parkinson's disease in her cousin.,  reports that she has never smoked. She has never used smokeless tobacco. She reports that she does not drink alcohol or use drugs.  She has a current medication list which includes the following prescription(s): aspirin ec, bupropion, calcium carbonate-vitamin d, premarin, lisinopril-hydrochlorothiazide, and venlafaxine xr. Also, has No Known Allergies.  Review of Systems  All other systems reviewed and are negative.   Objective: BP (!) 150/90   Ht 5\' 1"  (1.549 m)   Wt 117 lb (53.1 kg)   BMI 22.11 kg/m   Physical examination Constitutional NAD, Conversant  Skin No rashes, lesions or ulceration.   Extremities: Moves all appropriately.  Normal ROM for age. No lymphadenopathy.  Neuro: Grossly intact  Psych: Oriented to PPT.  Normal mood. Normal affect.    Assessment:  Post-menopausal bleeding Endometrial thickening on ultrasound   No mass.  Some thickening of lining, polyp vs hyperplasia. Risks and benefits of EMB d/w pt.  A total of 15 minutes were spent face-to-face with the patient during this encounter and over half of that time dealt with counseling and coordination of care.   Endometrial Biopsy  After discussion with the patient regarding her abnormal uterine bleeding I recommended that she proceed with an endometrial biopsy for further diagnosis. The risks, benefits, alternatives, and indications for an endometrial biopsy were discussed with the patient in detail. She understood the risks including infection, bleeding, cervical laceration and uterine perforation.  Verbal consent was obtained.   PROCEDURE NOTE:  Pipelle endometrial biopsy was performed using aseptic technique with iodine preparation.  The uterus was sounded to a length of 7 cm.  Adequate sampling was obtained with minimal blood loss.  The patient tolerated the procedure well.  Disposition will be pending pathology.  Barnett Applebaum, MD, [facility], [facility] 11/19/2018  11:34 AM

## 2018-11-21 NOTE — Progress Notes (Signed)
US/EMB    Results benign To reassure pt LM  Barnett Applebaum, MD, [facility] 11/21/2018  7:46 AM

## 2018-11-24 ENCOUNTER — Telehealth: Payer: Self-pay

## 2018-11-24 NOTE — Telephone Encounter (Signed)
Pt calling; missed call from [facility].  Wants to discuss detailed results.  [phone]

## 2018-11-25 ENCOUNTER — Encounter: Payer: Medicare Other | Admitting: Family Medicine

## 2018-11-25 NOTE — Telephone Encounter (Signed)
Did you call Pt?  

## 2018-11-25 NOTE — Progress Notes (Signed)
D/w pt, reassured

## 2018-12-24 ENCOUNTER — Other Ambulatory Visit: Payer: Medicare Other | Admitting: Urology

## 2018-12-29 ENCOUNTER — Ambulatory Visit (INDEPENDENT_AMBULATORY_CARE_PROVIDER_SITE_OTHER): Payer: Medicare Other

## 2018-12-29 ENCOUNTER — Encounter: Payer: Medicare HMO | Admitting: Family Medicine

## 2018-12-29 VITALS — Ht 61.0 in | Wt 117.0 lb

## 2018-12-29 DIAGNOSIS — Z0000 Encounter for general adult medical examination without abnormal findings: Secondary | ICD-10-CM | POA: Diagnosis not present

## 2018-12-29 NOTE — Patient Instructions (Signed)
Eileen Stewart , Thank you for taking time to come for your Medicare Wellness Visit. I appreciate your ongoing commitment to your health goals. Please review the following plan we discussed and let me know if I can assist you in the future.   Screening recommendations/referrals: Colonoscopy: discussed cologuard  Mammogram: declined Bone Density: declined Recommended yearly ophthalmology/optometry visit for glaucoma screening and checkup Recommended yearly dental visit for hygiene and checkup  Vaccinations: Influenza vaccine: declined Pneumococcal vaccine: up to date Tdap vaccine: up to date Shingles vaccine: shingrix eligible     Advanced directives: Please bring a copy of your health care power of attorney and living will to the office at your convenience.  Conditions/risks identified: please consider vaccines and screenings and let us know if you have any questions.   Next appointment: Follow up in one year for your annual wellness visit.    Preventive Care 66 Years and Older, Female Preventive care refers to lifestyle choices and visits with your health care provider that can promote health and wellness. What does preventive care include?  A yearly physical exam. This is also called an annual well check.  Dental exams once or twice a year.  Routine eye exams. Ask your health care provider how often you should have your eyes checked.  Personal lifestyle choices, including:  Daily care of your teeth and gums.  Regular physical activity.  Eating a healthy diet.  Avoiding tobacco and drug use.  Limiting alcohol use.  Practicing safe sex.  Taking low-dose aspirin every day.  Taking vitamin and mineral supplements as recommended by your health care provider. What happens during an annual well check? The services and screenings done by your health care provider during your annual well check will depend on your age, overall health, lifestyle risk factors, and family history  of disease. Counseling  Your health care provider may ask you questions about your:  Alcohol use.  Tobacco use.  Drug use.  Emotional well-being.  Home and relationship well-being.  Sexual activity.  Eating habits.  History of falls.  Memory and ability to understand (cognition).  Work and work Statistician.  Reproductive health. Screening  You may have the following tests or measurements:  Height, weight, and BMI.  Blood pressure.  Lipid and cholesterol levels. These may be checked every 5 years, or more frequently if you are over 28 years old.  Skin check.  Lung cancer screening. You may have this screening every year starting at age 78 if you have a 30-pack-year history of smoking and currently smoke or have quit within the past 15 years.  Fecal occult blood test (FOBT) of the stool. You may have this test every year starting at age 62.  Flexible sigmoidoscopy or colonoscopy. You may have a sigmoidoscopy every 5 years or a colonoscopy every 10 years starting at age 14.  Hepatitis C blood test.  Hepatitis B blood test.  Sexually transmitted disease (STD) testing.  Diabetes screening. This is done by checking your blood sugar (glucose) after you have not eaten for a while (fasting). You may have this done every 1-3 years.  Bone density scan. This is done to screen for osteoporosis. You may have this done starting at age 80.  Mammogram. This may be done every 1-2 years. Talk to your health care provider about how often you should have regular mammograms. Talk with your health care provider about your test results, treatment options, and if necessary, the need for more tests. Vaccines  Your health  care provider may recommend certain vaccines, such as:  Influenza vaccine. This is recommended every year.  Tetanus, diphtheria, and acellular pertussis (Tdap, Td) vaccine. You may need a Td booster every 10 years.  Zoster vaccine. You may need this after age 18.   Pneumococcal 13-valent conjugate (PCV13) vaccine. One dose is recommended after age 36.  Pneumococcal polysaccharide (PPSV23) vaccine. One dose is recommended after age 13. Talk to your health care provider about which screenings and vaccines you need and how often you need them. This information is not intended to replace advice given to you by your health care provider. Make sure you discuss any questions you have with your health care provider. Document Released: 03/25/2015 Document Revised: 11/16/2015 Document Reviewed: 12/28/2014 Elsevier Interactive Patient Education  2017 [place] Prevention in the Home Falls can cause injuries. They can happen to people of all ages. There are many things you can do to make your home safe and to help prevent falls. What can I do on the outside of my home?  Regularly fix the edges of walkways and driveways and fix any cracks.  Remove anything that might make you trip as you walk through a door, such as a raised step or threshold.  Trim any bushes or trees on the path to your home.  Use bright outdoor lighting.  Clear any walking paths of anything that might make someone trip, such as rocks or tools.  Regularly check to see if handrails are loose or broken. Make sure that both sides of any steps have handrails.  Any raised decks and porches should have guardrails on the edges.  Have any leaves, snow, or ice cleared regularly.  Use sand or salt on walking paths during winter.  Clean up any spills in your garage right away. This includes oil or grease spills. What can I do in the bathroom?  Use night lights.  Install grab bars by the toilet and in the tub and shower. Do not use towel bars as grab bars.  Use non-skid mats or decals in the tub or shower.  If you need to sit down in the shower, use a plastic, non-slip stool.  Keep the floor dry. Clean up any water that spills on the floor as soon as it happens.  Remove soap  buildup in the tub or shower regularly.  Attach bath mats securely with double-sided non-slip rug tape.  Do not have throw rugs and other things on the floor that can make you trip. What can I do in the bedroom?  Use night lights.  Make sure that you have a light by your bed that is easy to reach.  Do not use any sheets or blankets that are too big for your bed. They should not hang down onto the floor.  Have a firm chair that has side arms. You can use this for support while you get dressed.  Do not have throw rugs and other things on the floor that can make you trip. What can I do in the kitchen?  Clean up any spills right away.  Avoid walking on wet floors.  Keep items that you use a lot in easy-to-reach places.  If you need to reach something above you, use a strong step stool that has a grab bar.  Keep electrical cords out of the way.  Do not use floor polish or wax that makes floors slippery. If you must use wax, use non-skid floor wax.  Do not have  throw rugs and other things on the floor that can make you trip. What can I do with my stairs?  Do not leave any items on the stairs.  Make sure that there are handrails on both sides of the stairs and use them. Fix handrails that are broken or loose. Make sure that handrails are as long as the stairways.  Check any carpeting to make sure that it is firmly attached to the stairs. Fix any carpet that is loose or worn.  Avoid having throw rugs at the top or bottom of the stairs. If you do have throw rugs, attach them to the floor with carpet tape.  Make sure that you have a light switch at the top of the stairs and the bottom of the stairs. If you do not have them, ask someone to add them for you. What else can I do to help prevent falls?  Wear shoes that:  Do not have high heels.  Have rubber bottoms.  Are comfortable and fit you well.  Are closed at the toe. Do not wear sandals.  If you use a stepladder:  Make  sure that it is fully opened. Do not climb a closed stepladder.  Make sure that both sides of the stepladder are locked into place.  Ask someone to hold it for you, if possible.  Clearly mark and make sure that you can see:  Any grab bars or handrails.  First and last steps.  Where the edge of each step is.  Use tools that help you move around (mobility aids) if they are needed. These include:  Canes.  Walkers.  Scooters.  Crutches.  Turn on the lights when you go into a dark area. Replace any light bulbs as soon as they burn out.  Set up your furniture so you have a clear path. Avoid moving your furniture around.  If any of your floors are uneven, fix them.  If there are any pets around you, be aware of where they are.  Review your medicines with your doctor. Some medicines can make you feel dizzy. This can increase your chance of falling. Ask your doctor what other things that you can do to help prevent falls. This information is not intended to replace advice given to you by your health care provider. Make sure you discuss any questions you have with your health care provider. Document Released: 12/23/2008 Document Revised: 08/04/2015 Document Reviewed: 04/02/2014 Elsevier Interactive Patient Education  2017 [employer].

## 2018-12-29 NOTE — Progress Notes (Signed)
Subjective:   Eileen Stewart is a 68 y.o. female who presents for Medicare Annual (initial) preventive examination.  This visit is being conducted via phone call  - after an attmept to do on video chat - due to the COVID-19 pandemic. This patient has given me verbal consent via phone to conduct this visit, patient states they are participating from their home address. Some vital signs may be absent or patient reported.   Patient identification: identified by name, DOB, and current address.    Review of Systems:   Cardiac Risk Factors include: advanced age (>72men, >4 women);hypertension     Objective:     Vitals: Ht 5\' 1"  (1.549 m) Comment: pt reported  Wt 117 lb (53.1 kg) Comment: pt reported  BMI 22.11 kg/m   Body mass index is 22.11 kg/m.  Advanced Directives 12/29/2018 07/30/2017  Does Patient Have a Medical Advance Directive? Yes Yes  Type of Advance Directive Living will;Healthcare Power of [place];Living will  Copy of [place] in Chart? No - copy requested -    Tobacco Social History   Tobacco Use  Smoking Status Never Smoker  Smokeless Tobacco Never Used     Counseling given: Not Answered   Clinical Intake:  Pre-visit preparation completed: Yes  Pain : No/denies pain Pain Score: 0-No pain     Nutritional Status: BMI of 19-24  Normal Nutritional Risks: None Diabetes: No  How often do you need to have someone help you when you read instructions, pamphlets, or other written materials from your doctor or pharmacy?: 1 - Never  Interpreter Needed?: No  Information entered by :: Tiffany Hill,LPN  Past Medical History:  Diagnosis Date  . Acute pyelonephritis   . Alcohol abuse    Alcohol, remission since 2013  . Anemia   . Asymptomatic varicose veins, unspecified laterality   . Depression   . Essential tremor   . Hyperlipidemia   . Hypertension   . Liver mass   . Vitamin D deficiency    Past  Surgical History:  Procedure Laterality Date  . SPLENECTOMY     Family History  Problem Relation Age of Onset  . Hyperlipidemia Mother   . Hypertension Mother   . Alcohol abuse Father   . Depression Sister   . Anxiety disorder Sister   . Parkinson's disease Cousin    Social History   Socioeconomic History  . Marital status: Divorced    Spouse name: Not on file  . Number of children: Not on file  . Years of education: Not on file  . Highest education level: Associate degree: academic program  Occupational History  . Occupation: retired  Scientific laboratory technician  . Financial resource strain: Not hard at all  . Food insecurity    Worry: Never true    Inability: Never true  . Transportation needs    Medical: No    Non-medical: No  Tobacco Use  . Smoking status: Never Smoker  . Smokeless tobacco: Never Used  Substance and Sexual Activity  . Alcohol use: Yes    Frequency: Never    Comment: 3 beers a month on average   . Drug use: No  . Sexual activity: Not on file  Lifestyle  . Physical activity    Days per week: 3 days    Minutes per session: 60 min  . Stress: Not at all  Relationships  . Social connections    Talks on phone: More than three times  a week    Gets together: Once a week    Attends religious service: More than 4 times per year    Active member of club or organization: No    Attends meetings of clubs or organizations: Never    Relationship status: Divorced  Other Topics Concern  . Not on file  Social History Narrative  . Not on file    Outpatient Encounter Medications as of 12/29/2018  Medication Sig  . aspirin EC 81 MG tablet Take by mouth.  Marland Kitchen. buPROPion (WELLBUTRIN XL) 300 MG 24 hr tablet TK 1 T PO D  . Calcium Carbonate-Vitamin D (CALCIUM 500/D PO) Take by mouth.  Marland Kitchen. lisinopril-hydrochlorothiazide (ZESTORETIC) 20-12.5 MG tablet Take 1 tablet by mouth daily.  Marland Kitchen. venlafaxine XR (EFFEXOR XR) 75 MG 24 hr capsule Take 1 capsule (75 mg total) by mouth daily with  breakfast.  . [DISCONTINUED] conjugated estrogens (PREMARIN) vaginal cream Place 1 Applicatorful vaginally daily. Use pea sized amount M-W-Fr before bedtime (Patient not taking: Reported on 12/29/2018)   No facility-administered encounter medications on file as of 12/29/2018.     Activities of Daily Living In your present state of health, do you have any difficulty performing the following activities: 12/29/2018  Hearing? N  Comment no hearing aids  Vision? N  Comment eyeglasses, goes to eye dr annually  Difficulty concentrating or making decisions? N  Walking or climbing stairs? N  Dressing or bathing? N  Doing errands, shopping? N  Preparing Food and eating ? N  Using the Toilet? N  In the past six months, have you accidently leaked urine? Y  Comment wears pads for protection  Do you have problems with loss of bowel control? N  Managing your Medications? N  Managing your Finances? N  Housekeeping or managing your Housekeeping? N  Some recent data might be hidden    Patient Care Team: Particia NearingLane, Rachel Elizabeth, PA-C as PCP - General (Family Medicine)    Assessment:   This is a routine wellness examination for Eileen Stewart.  Exercise Activities and Dietary recommendations Current Exercise Habits: Home exercise routine, Type of exercise: walking, Time (Minutes): 60, Frequency (Times/Week): 4, Weekly Exercise (Minutes/Week): 240, Intensity: Mild, Exercise limited by: None identified  Goals   None     Fall Risk: Fall Risk  12/29/2018 08/01/2018 05/21/2018 12/23/2017  Falls in the past year? 0 0 0 No  Number falls in past yr: 0 0 - -  Injury with Fall? 0 0 - -    FALL RISK PREVENTION PERTAINING TO THE HOME:  Any stairs in or around the home? No  If so, are there any without handrails? No   Home free of loose throw rugs in walkways, pet beds, electrical cords, etc? Yes  Adequate lighting in your home to reduce risk of falls? No   ASSISTIVE DEVICES UTILIZED TO PREVENT FALLS:   Life alert? No  Use of a cane, walker or w/c? No  Grab bars in the bathroom? No  Shower chair or bench in shower? No  Elevated toilet seat or a handicapped toilet? No   DME ORDERS:  DME order needed?  No   TIMED UP AND GO:  Unable to perform    Depression Screen PHQ 2/9 Scores 12/29/2018 08/01/2018 05/21/2018 12/23/2017  PHQ - 2 Score 0 6 2 1   PHQ- 9 Score - 15 4 2      Cognitive Function     6CIT Screen 12/23/2017  What Year? 4 points  What month? 3  points  What time? 3 points  Count back from 20 4 points  Months in reverse 4 points  Repeat phrase 8 points  Total Score 26    Immunization History  Administered Date(s) Administered  . Influenza, High Dose Seasonal PF 11/20/2017  . Influenza,inj,Quad PF,6+ Mos 11/14/2016  . Influenza-Unspecified 11/14/2016  . Meningococcal Conjugate 10/06/2015  . PPD Test 12/24/2012  . Pneumococcal Conjugate-13 11/14/2016  . Pneumococcal Polysaccharide-23 10/08/2014  . Td 06/28/2014    Qualifies for Shingles Vaccine? Shingrix discussed  Tdap: up to date  Flu Vaccine: Due for Flu vaccine. Decline  Pneumococcal Vaccine: up to date   Screening Tests Health Maintenance  Topic Date Due  . COLONOSCOPY  10/11/2002  . DEXA SCAN  10/10/2017  . INFLUENZA VACCINE  10/11/2018  . MAMMOGRAM  05/21/2019 (Originally 01/03/2018)  . PNA vac Low Risk Adult (2 of 2 - PPSV23) 10/08/2019  . TETANUS/TDAP  06/27/2024  . Hepatitis C Screening  Completed    Cancer Screenings:  Colorectal Screening: discussed cologuard, she will think about this more.  Mammogram: Completed 2017. Declined   Bone Density: declined   Lung Cancer Screening: (Low Dose CT Chest recommended if Age 76-80 years, 30 pack-year currently smoking OR have quit w/in 15years.) does not qualify.  .  Additional Screening:  Hepatitis C Screening: does qualify; Completed 10/05/2014  Vision Screening: Recommended annual ophthalmology exams for early detection of glaucoma  and other disorders of the eye. Is the patient up to date with their annual eye exam?  Yes  Who is the provider or what is the name of the office in which the pt attends annual eye exams? Eye center   Dental Screening: Recommended annual dental exams for proper oral hygiene  Community Resource Referral:  CRR required this visit?  No       Plan:  I have personally reviewed and addressed the Medicare Annual Wellness questionnaire and have noted the following in the patient's chart:  A. Medical and social history B. Use of alcohol, tobacco or illicit drugs  C. Current medications and supplements D. Functional ability and status E.  Nutritional status F.  Physical activity G. Advance directives H. List of other physicians I.  Hospitalizations, surgeries, and ER visits in previous 12 months J.  Vitals K. Screenings such as hearing and vision if needed, cognitive and depression L. Referrals and appointments   In addition, I have reviewed and discussed with patient certain preventive protocols, quality metrics, and best practice recommendations. A written personalized care plan for preventive services as well as general preventive health recommendations were provided to patient.  Signed,    Collene Schlichter, LPN  31/59/[id number] Nurse Health Advisor   Nurse Notes: none

## 2018-12-30 ENCOUNTER — Other Ambulatory Visit: Payer: Self-pay

## 2018-12-30 DIAGNOSIS — R3129 Other microscopic hematuria: Secondary | ICD-10-CM

## 2019-01-02 ENCOUNTER — Ambulatory Visit: Payer: Medicare Other | Admitting: Urology

## 2019-01-02 ENCOUNTER — Other Ambulatory Visit
Admission: RE | Admit: 2019-01-02 | Discharge: 2019-01-02 | Disposition: A | Discharge status: Home / Self Care | Payer: Medicare Other | Attending: Urology | Admitting: Urology

## 2019-01-02 ENCOUNTER — Encounter: Payer: Self-pay | Admitting: Urology

## 2019-01-02 ENCOUNTER — Other Ambulatory Visit: Payer: Self-pay

## 2019-01-02 VITALS — BP 125/77 | HR 88 | Ht 61.0 in | Wt 116.0 lb

## 2019-01-02 DIAGNOSIS — N362 Urethral caruncle: Secondary | ICD-10-CM | POA: Diagnosis not present

## 2019-01-02 DIAGNOSIS — R3129 Other microscopic hematuria: Secondary | ICD-10-CM

## 2019-01-02 DIAGNOSIS — N3281 Overactive bladder: Secondary | ICD-10-CM

## 2019-01-02 LAB — URINALYSIS, COMPLETE (UACMP) WITH MICROSCOPIC
Bacteria, UA: NONE SEEN
Bilirubin Urine: NEGATIVE
Glucose, UA: NEGATIVE mg/dL
Ketones, ur: NEGATIVE mg/dL
Nitrite: NEGATIVE
Protein, ur: NEGATIVE mg/dL
Specific Gravity, Urine: 1.025 (ref 1.005–1.030)
pH: 6 (ref 5.0–8.0)

## 2019-01-02 MED ORDER — ESTRADIOL 0.1 MG/GM VA CREA: TOPICAL_CREAM | VAGINAL | 12 refills | Status: DC

## 2019-01-02 MED ORDER — OXYBUTYNIN CHLORIDE ER 10 MG PO TB24: 10.0000 mg | ORAL_TABLET | Freq: Every day | ORAL | 3 refills | Status: DC

## 2019-01-02 NOTE — Patient Instructions (Signed)

## 2019-01-02 NOTE — Progress Notes (Signed)
   01/08/19  CC:  Chief Complaint  Patient presents with  . Cysto    HPI: 68 year old female with a personal history of microscopic hematuria and a large irritated appearing urethral caruncle who returns today for follow-up.  In the interim, she is used a small sample tube of topical estrogen cream, initially daily for the first several weeks and then intermittently.  She is run out of this medication and not been able to afford the prescription at the grocery store.  She does report that the irritation/size of the caruncle has decreased significantly.  She also mentions today that over the past several months, she is been more physically active.  When she walks around the block, she will have a sudden urge to void and have a large volume incontinence.  This is a fairly new issue for her.  She does not take any OAB meds.  Today's Vitals   01/02/19 1548  BP: 125/77  Pulse: 88  Weight: 116 lb (52.6 kg)  Height: 5\' 1"  (1.549 m)   Body mass index is 21.92 kg/m.  NED. A&Ox3.   No respiratory distress   Abd soft, NT, ND Normal external genitalia with patent urethral meatus.  Urethral caruncle now approximately half previous size.  Cystoscopy Procedure Note  Patient identification was confirmed, informed consent was obtained, and patient was prepped using Betadine solution.  Lidocaine jelly was administered per urethral meatus.    Procedure: - Flexible cystoscope introduced, without any difficulty.   - Thorough search of the bladder revealed:    normal urethral meatus    normal urothelium    no stones    no ulcers     no tumors    no urethral polyps    no trabeculation  - Ureteral orifices were normal in position and appearance.  Post-Procedure: - Patient tolerated the procedure well  Assessment/ Plan:  1. Urethral caruncle Given the size of the lesion, would strongly recommend continuation of topical estrogen cream until the lesion is almost are nearly gone.  There  has been significant benefit so far with approximately 50% reduction in size.  She was given another sample today and an alternative prescription was called into compounding pharmacy which is likely more affordable.  She is agreeable this plan.  2. Microscopic hematuria Likely secondary to irritated urethral caruncle externally  Cystoscopy today is reassuring  Plan for repeat urinalysis next visit to assess whether or not we feel that she needs any upper tract imaging for completeness.  3. OAB (overactive bladder) Significant bladder overactivity which is very bothersome to her.  We will try oxybutynin to see if this helps her any with her OAB type symptoms.  Possible side effects including dry eyes, dry mouth and constipation were discussed.  Plan for reassessment of symptoms at follow-up.      Return in about 3 months (around 04/04/2019) for shannon symptoms recheck, PVR, UA.  Hollice Espy, MD

## 2019-01-27 NOTE — Telephone Encounter (Signed)
No I haven't called her

## 2019-03-01 ENCOUNTER — Other Ambulatory Visit: Payer: Self-pay | Admitting: Urology

## 2019-03-08 ENCOUNTER — Other Ambulatory Visit: Payer: Self-pay

## 2019-03-08 ENCOUNTER — Ambulatory Visit
Admission: EM | Admit: 2019-03-08 | Discharge: 2019-03-08 | Disposition: A | Discharge status: Home / Self Care | Payer: Medicare Other | Attending: Emergency Medicine | Admitting: Emergency Medicine

## 2019-03-08 ENCOUNTER — Encounter: Payer: Self-pay | Admitting: Emergency Medicine

## 2019-03-08 DIAGNOSIS — L03116 Cellulitis of left lower limb: Secondary | ICD-10-CM

## 2019-03-08 DIAGNOSIS — S90562A Insect bite (nonvenomous), left ankle, initial encounter: Secondary | ICD-10-CM | POA: Diagnosis not present

## 2019-03-08 DIAGNOSIS — Z23 Encounter for immunization: Secondary | ICD-10-CM

## 2019-03-08 DIAGNOSIS — W57XXXA Bitten or stung by nonvenomous insect and other nonvenomous arthropods, initial encounter: Secondary | ICD-10-CM | POA: Diagnosis not present

## 2019-03-08 MED ORDER — TETANUS-DIPHTH-ACELL PERTUSSIS 5-2.5-18.5 LF-MCG/0.5 IM SUSP
0.5000 mL | Freq: Once | INTRAMUSCULAR | Status: AC
  Administered 2019-03-08: 09:00:00 0.5 mL via INTRAMUSCULAR

## 2019-03-08 MED ORDER — DOXYCYCLINE HYCLATE 100 MG PO CAPS: 100.0000 mg | ORAL_CAPSULE | Freq: Two times a day (BID) | ORAL | 0 refills | Status: DC

## 2019-03-08 NOTE — Discharge Instructions (Addendum)
You are being treated for cellulitis due to an insect bite.  Take the antibiotics as prescribed until they're finished. If you think you're having a reaction, stop the medication, take benadryl and go to the nearest urgent care/emergency room. Take a probiotic while taking the antibiotic to decrease the chances of stomach upset.   Your tetanus is also being updated. You are covered for 5 years for any type of injury, 10 years with no injury.

## 2019-03-08 NOTE — ED Provider Notes (Addendum)
[facility] - [place], [place]   Name: Eileen Stewart DOB: Aug 05, 1950 MRN: [id number] CSN: [id number] PCP: Volney American, PA-C  Arrival date and time:  03/08/19 2831  Chief Complaint:  Insect Bite   NOTE: Prior to seeing the patient today, I have reviewed the triage nursing documentation and vital signs. Clinical staff has updated patient's PMH/PSHx, current medication list, and drug allergies/intolerances to ensure comprehensive history available to assist in medical decision making.   History:   HPI: Eileen Stewart is a 68 y.o. female who presents today with complaints of possible insect bite to her left ankle.  She states she felt an itching sensation to the outside of her left ankle approximately 1 week ago.  She did not see if anything had bit her at that time.  She has noticed increased pain and swelling at the site over the past few days.  She has  treated the pain with over-the-counter pain medication and has had minimal relief.  She denies any fevers chills or body aches associated with the bite.  No recent antibiotics.  She believes her last tetanus vaccine was almost 10 years ago.  Past Medical History:  Diagnosis Date  . Acute pyelonephritis   . Alcohol abuse    Alcohol, remission since 2013  . Anemia   . Asymptomatic varicose veins, unspecified laterality   . Depression   . Essential tremor   . Hyperlipidemia   . Hypertension   . Liver mass   . Vitamin D deficiency     Past Surgical History:  Procedure Laterality Date  . SPLENECTOMY      Family History  Problem Relation Age of Onset  . Hyperlipidemia Mother   . Hypertension Mother   . Alcohol abuse Father   . Depression Sister   . Anxiety disorder Sister   . Parkinson's disease Cousin     Social History   Tobacco Use  . Smoking status: Never Smoker  . Smokeless tobacco: Never Used  Substance Use Topics  . Alcohol use: Yes    Comment: 3 beers a month on average   . Drug use: No    Patient Active Problem List   Diagnosis Date Noted  . Post-menopausal bleeding 11/19/2018  . Endometrial thickening on ultrasound 11/19/2018  . Abnormality of urethral meatus 11/03/2018  . Benign essential tremor 05/16/2017  . Hyperlipidemia 04/17/2017  . Hypertension 04/17/2017  . Major depression, recurrent, chronic ([place]) 04/17/2017    Home Medications:    Current Meds  Medication Sig  . buPROPion (WELLBUTRIN XL) 300 MG 24 hr tablet TK 1 T PO D  . Calcium Carbonate-Vitamin D (CALCIUM 500/D PO) Take by mouth.  . estradiol (ESTRACE) 0.1 MG/GM vaginal cream Use pea size amount before bed onto urethra  Monday , Wednesday, and Friday  . lisinopril-hydrochlorothiazide (ZESTORETIC) 20-12.5 MG tablet Take 1 tablet by mouth daily.  Marland Kitchen oxybutynin (DITROPAN-XL) 10 MG 24 hr tablet Take 1 tablet (10 mg total) by mouth daily.  Marland Kitchen venlafaxine XR (EFFEXOR XR) 75 MG 24 hr capsule Take 1 capsule (75 mg total) by mouth daily with breakfast.    Allergies:   Patient has no known allergies.  Review of Systems (ROS): Review of Systems  Constitutional: Negative for fatigue and fever.  Musculoskeletal: Negative for arthralgias and myalgias.  Skin: Positive for rash and wound.  All other systems reviewed and are negative.    Vital Signs: Today's Vitals   03/08/19 5176 03/08/19 1607 03/08/19 0845 03/08/19 3710  BP:   (!) 165/88 (!) 159/93  Pulse:   72   Resp:   14   Temp:   98.4 F (36.9 C)   TempSrc:   Oral   SpO2:   99%   Weight:  114 lb (51.7 kg)    Height:  5\' 1"  (1.549 m)    PainSc: 7        Physical Exam: Physical Exam Vitals and nursing note reviewed.  Constitutional:      General: She is not in acute distress.    Appearance: She is well-developed.  HENT:     Head: Normocephalic and atraumatic.  Cardiovascular:     Rate and Rhythm: Normal rate and regular rhythm.     Heart sounds: No murmur.  Pulmonary:     Effort: Pulmonary effort is normal. No respiratory distress.      Breath sounds: Normal breath sounds.  Abdominal:     Palpations: Abdomen is soft.     Tenderness: There is no abdominal tenderness.  Skin:    General: Skin is warm and dry.     Findings: Lesion present.       Neurological:     Mental Status: She is alert.      Urgent Care Treatments / Results:   LABS: PLEASE NOTE: all labs that were ordered this encounter are listed, however only abnormal results are displayed. Labs Reviewed - No data to display  EKG: -None  RADIOLOGY: No results found.  PROCEDURES: Procedures  MEDICATIONS RECEIVED THIS VISIT: Medications  Tdap (BOOSTRIX) injection 0.5 mL (0.5 mLs Intramuscular Given 03/08/19 0905)    PERTINENT CLINICAL COURSE NOTES/UPDATES:   Initial Impression / Assessment and Plan / Urgent Care Course:  Pertinent labs & imaging results that were available during my care of the patient were personally reviewed by me and considered in my medical decision making (see lab/imaging section of note for values and interpretations).  Eileen Stewart is a 68 y.o. female who presents to [facility] today with complaints of a possible insect bite, diagnosed with cellulitis, and treated as such with the medications below.  Her tetanus was also updated during this visit. NP and patient reviewed discharge instructions below during visit.   Patient is well appearing overall in clinic today. She does not appear to be in any acute distress. Presenting symptoms (see HPI) and exam as documented above.   I have reviewed the follow up and strict return precautions for any new or worsening symptoms. Patient is aware of symptoms that would be deemed urgent/emergent, and would thus require further evaluation either here or in the emergency department. At the time of discharge, she verbalized understanding and consent with the discharge plan as it was reviewed with her. All questions were fielded by provider and/or clinic staff prior to patient discharge.     Final Clinical Impressions / Urgent Care Diagnoses:   Final diagnoses:  Insect bite of left ankle, initial encounter  Cellulitis of left lower extremity    New Prescriptions:  [place] Controlled Substance Registry consulted? Not Applicable  Meds ordered this encounter  Medications  . Tdap (BOOSTRIX) injection 0.5 mL  . doxycycline (VIBRAMYCIN) 100 MG capsule    Sig: Take 1 capsule (100 mg total) by mouth 2 (two) times daily for 10 days.    Dispense:  20 capsule    Refill:  0      Discharge Instructions     You are being treated for cellulitis due to an insect bite.  Take the antibiotics as prescribed until they're finished. If you think you're having a reaction, stop the medication, take benadryl and go to the nearest urgent care/emergency room. Take a probiotic while taking the antibiotic to decrease the chances of stomach upset.   Your tetanus is also being updated. You are covered for 5 years for any type of injury, 10 years with no injury.    Recommended Follow up Care:  Patient encouraged to follow up with the following provider within the specified time frame, or sooner as dictated by the severity of her symptoms. As always, she was instructed that for any urgent/emergent care needs, she should seek care either here or in the emergency department for more immediate evaluation.   Bailey MechLunise Taylor Spilde, DNP, NP-c    Bailey MechBenjamin, Yenesis Even, NP 03/08/19 [id number]    Bailey MechBenjamin, Ashling Roane, NP 03/08/19 [phone]

## 2019-03-08 NOTE — ED Triage Notes (Signed)
Patient thinks that maybe a spider bit her on her left ankle about a week ago.  Patient states that it started out as an itchy bump but is now having pain and swelling at the site.  Patient denies fevers.

## 2019-03-12 NOTE — Telephone Encounter (Signed)
[place] spoke c pt.

## 2019-03-16 ENCOUNTER — Encounter: Payer: Medicare Other | Admitting: Family Medicine

## 2019-03-17 ENCOUNTER — Other Ambulatory Visit: Payer: Self-pay

## 2019-03-17 ENCOUNTER — Telehealth: Payer: Self-pay | Admitting: Family Medicine

## 2019-03-17 NOTE — Telephone Encounter (Signed)
She has an appt with me next week if she can wait to discuss then, if not happy to see her sooner

## 2019-03-17 NOTE — Telephone Encounter (Signed)
Routing to provider. Does the patient need an appointment first?

## 2019-03-17 NOTE — Telephone Encounter (Signed)
Appt scheduled

## 2019-03-17 NOTE — Telephone Encounter (Signed)
Referral Request - Has patient seen PCP for this complaint? Yes.   *If NO, is insurance requiring patient see PCP for this issue before PCP can refer them? Referral for which specialty: Podiatry Preferred provider/office: insurance covers Reason for referral: Patient is still having left foot pain.

## 2019-03-18 ENCOUNTER — Encounter: Payer: Self-pay | Admitting: Family Medicine

## 2019-03-18 ENCOUNTER — Ambulatory Visit (INDEPENDENT_AMBULATORY_CARE_PROVIDER_SITE_OTHER): Payer: Medicare Other | Admitting: Family Medicine

## 2019-03-18 ENCOUNTER — Other Ambulatory Visit: Payer: Self-pay

## 2019-03-18 VITALS — BP 127/75 | HR 73 | Temp 98.1°F | Ht 61.0 in | Wt 113.0 lb

## 2019-03-18 DIAGNOSIS — M79672 Pain in left foot: Secondary | ICD-10-CM | POA: Diagnosis not present

## 2019-03-18 NOTE — Progress Notes (Signed)
BP 127/75   Pulse 73   Temp 98.1 F (36.7 C) (Oral)   Ht 5\' 1"  (1.549 m)   Wt 113 lb (51.3 kg)   SpO2 97%   BMI 21.35 kg/m    Subjective:    Patient ID: Eileen Stewart, female    DOB: Mar 03, 1951, 69 y.o.   MRN: [id number]  HPI: Eileen Stewart is a 69 y.o. female  Chief Complaint  Patient presents with  . Foot Pain    pt states she has had pain on her left foot and she wants to see a foot doctor   Here today for UC f/u for left foot cellulitis from suspected insect bite to area beside lateral malleolus. Just finished antibiotics this morning and does feel some better. Swelling and redness has gone but the pain persists all over the left foot. Does have some numbness down the foot at times and every once in a while pain shoots up the leg. This issue has been ongoing for 2-3 weeks now. Had been propping the foot up while swollen, no longer doing so since the swelling came down. Not currently doing anything or taking anything special for her sxs. Denies fever, chills, sweats, leg weakness, mobility issues.   Relevant past medical, surgical, family and social history reviewed and updated as indicated. Interim medical history since our last visit reviewed. Allergies and medications reviewed and updated.  Review of Systems  Per HPI unless specifically indicated above     Objective:    BP 127/75   Pulse 73   Temp 98.1 F (36.7 C) (Oral)   Ht 5\' 1"  (1.549 m)   Wt 113 lb (51.3 kg)   SpO2 97%   BMI 21.35 kg/m   Wt Readings from Last 3 Encounters:  03/18/19 113 lb (51.3 kg)  03/08/19 114 lb (51.7 kg)  01/02/19 116 lb (52.6 kg)    Physical Exam Vitals and nursing note reviewed.  Constitutional:      Appearance: Normal appearance. She is not ill-appearing.  HENT:     Head: Atraumatic.  Eyes:     Extraocular Movements: Extraocular movements intact.     Conjunctiva/sclera: Conjunctivae normal.  Cardiovascular:     Rate and Rhythm: Normal rate and regular rhythm.   Heart sounds: Normal heart sounds.  Pulmonary:     Effort: Pulmonary effort is normal.     Breath sounds: Normal breath sounds.  Musculoskeletal:        General: No swelling or deformity. Normal range of motion.     Cervical back: Normal range of motion and neck supple.  Skin:    General: Skin is warm and dry.     Findings: No erythema.     Comments: Well healing lesions beside left lateral malleolus at initial site No surrounding erythema, warmth, edema  Neurological:     Mental Status: She is alert and oriented to person, place, and time.     Sensory: No sensory deficit.     Motor: No weakness.     Gait: Gait normal.  Psychiatric:        Mood and Affect: Mood normal.        Thought Content: Thought content normal.        Judgment: Judgment normal.     Results for orders placed or performed during the hospital encounter of 01/02/19  Urinalysis, Complete w Microscopic (For [facility] ONLY)  Result Value Ref Range   Color, Urine YELLOW YELLOW   APPearance CLEAR CLEAR  Specific Gravity, Urine 1.025 1.005 - 1.030   pH 6.0 5.0 - 8.0   Glucose, UA NEGATIVE NEGATIVE mg/dL   Hgb urine dipstick SMALL (A) NEGATIVE   Bilirubin Urine NEGATIVE NEGATIVE   Ketones, ur NEGATIVE NEGATIVE mg/dL   Protein, ur NEGATIVE NEGATIVE mg/dL   Nitrite NEGATIVE NEGATIVE   Leukocytes,Ua TRACE (A) NEGATIVE   Squamous Epithelial / LPF 0-5 0 - 5   WBC, UA 6-10 0 - 5 WBC/hpf   RBC / HPF 6-10 0 - 5 RBC/hpf   Bacteria, UA NONE SEEN NONE SEEN      Assessment & Plan:   Problem List Items Addressed This Visit    None    Visit Diagnoses    Left foot pain    -  Primary   Improving per patient with some lingering radicular pain. Offered gabapentin or prednisone, referral to podiatry. She wishes to watch and start epsom salt soaks    Return precautions reviewed with patient. She will call if issue worsening or not fully resolving. Will recheck at CPE next week   Follow up plan: Return for as scheduled  for CPE.

## 2019-03-27 ENCOUNTER — Encounter: Payer: Self-pay | Admitting: Family Medicine

## 2019-03-27 ENCOUNTER — Ambulatory Visit (INDEPENDENT_AMBULATORY_CARE_PROVIDER_SITE_OTHER): Payer: Medicare Other | Admitting: Family Medicine

## 2019-03-27 ENCOUNTER — Other Ambulatory Visit: Payer: Self-pay

## 2019-03-27 VITALS — BP 138/79 | HR 74 | Temp 98.1°F | Ht 61.0 in | Wt 113.0 lb

## 2019-03-27 DIAGNOSIS — I10 Essential (primary) hypertension: Secondary | ICD-10-CM | POA: Diagnosis not present

## 2019-03-27 DIAGNOSIS — E782 Mixed hyperlipidemia: Secondary | ICD-10-CM

## 2019-03-27 DIAGNOSIS — M79672 Pain in left foot: Secondary | ICD-10-CM | POA: Diagnosis not present

## 2019-03-27 DIAGNOSIS — Z0000 Encounter for general adult medical examination without abnormal findings: Secondary | ICD-10-CM | POA: Diagnosis not present

## 2019-03-27 DIAGNOSIS — F339 Major depressive disorder, recurrent, unspecified: Secondary | ICD-10-CM | POA: Diagnosis not present

## 2019-03-27 MED ORDER — VENLAFAXINE HCL ER 75 MG PO CP24: 75.0000 mg | ORAL_CAPSULE | Freq: Every day | ORAL | 1 refills | Status: DC

## 2019-03-27 MED ORDER — BUPROPION HCL ER (XL) 300 MG PO TB24: ORAL_TABLET | ORAL | 1 refills | Status: DC

## 2019-03-27 MED ORDER — LISINOPRIL-HYDROCHLOROTHIAZIDE 20-12.5 MG PO TABS: 1.0000 | ORAL_TABLET | Freq: Every day | ORAL | 1 refills | Status: DC

## 2019-03-27 NOTE — Assessment & Plan Note (Signed)
BPs stable and WNL, continue current regimen 

## 2019-03-27 NOTE — Assessment & Plan Note (Signed)
Stable and under good control, continue current regimen 

## 2019-03-27 NOTE — Progress Notes (Signed)
BP 138/79   Pulse 74   Temp 98.1 F (36.7 C) (Oral)   Ht 5\' 1"  (1.549 m)   Wt 113 lb (51.3 kg)   SpO2 98%   BMI 21.35 kg/m    Subjective:    Patient ID: Eileen Stewart, female    DOB: 10-21-1950, 69 y.o.   MRN: [id number]  HPI: Eileen Stewart is a 70 y.o. female presenting on 03/27/2019 for comprehensive medical examination. Current medical complaints include:see below  Anxiety and depression - doing very well on the effexor and wellbutrin regimen. Denies mood swings, anhedonia, crying spells, sleep or appetite concerns. Still having lots of stress at home but once things calm down she's hoping to taper off the medications.   Foot pain from bug bite left ankle seems to be nearly gone, improving daily. No concerns there.   HTN - does not check home BPs. Tolerating medication well without side effects. Denies CP, SOB, HAs, dizziness. Eating well and trying to stay active.   She currently lives with: Menopausal Symptoms: no  Depression Screen done today and results listed below:  Depression screen [facility] 2/9 03/27/2019 12/29/2018 08/01/2018 05/21/2018 12/23/2017  Decreased Interest 0 0 3 1 0  Down, Depressed, Hopeless 0 0 3 1 1   PHQ - 2 Score 0 0 6 2 1   Altered sleeping 0 - 3 1 0  Tired, decreased energy 1 - 3 0 0  Change in appetite 0 - 1 0 0  Feeling bad or failure about yourself  0 - 2 1 0  Trouble concentrating 0 - 0 0 1  Moving slowly or fidgety/restless 0 - 0 0 0  Suicidal thoughts 0 - 0 0 0  PHQ-9 Score 1 - 15 4 2   Difficult doing work/chores - - Extremely dIfficult - -    The patient does not have a history of falls. I did complete a risk assessment for falls. A plan of care for falls was documented.   Past Medical History:  Past Medical History:  Diagnosis Date  . Acute pyelonephritis   . Alcohol abuse    Alcohol, remission since 2013  . Anemia   . Asymptomatic varicose veins, unspecified laterality   . Depression   . Essential tremor   . Hyperlipidemia   .  Hypertension   . Liver mass   . Vitamin D deficiency     Surgical History:  Past Surgical History:  Procedure Laterality Date  . SPLENECTOMY      Medications:  Current Outpatient Medications on File Prior to Visit  Medication Sig  . aspirin EC 81 MG tablet Take by mouth.  . Calcium Carbonate-Vitamin D (CALCIUM 500/D PO) Take by mouth.  . oxybutynin (DITROPAN-XL) 10 MG 24 hr tablet Take 1 tablet (10 mg total) by mouth daily.   No current facility-administered medications on file prior to visit.    Allergies:  No Known Allergies  Social History:  Social History   Socioeconomic History  . Marital status: Divorced    Spouse name: Not on file  . Number of children: Not on file  . Years of education: Not on file  . Highest education level: Associate degree: academic program  Occupational History  . Occupation: retired  Tobacco Use  . Smoking status: Never Smoker  . Smokeless tobacco: Never Used  Substance and Sexual Activity  . Alcohol use: Yes    Comment: 3 beers a month on average   . Drug use: No  . Sexual activity: Not on  file  Other Topics Concern  . Not on file  Social History Narrative  . Not on file   Social Determinants of Health   Financial Resource Strain: Low Risk   . Difficulty of Paying Living Expenses: Not hard at all  Food Insecurity: No Food Insecurity  . Worried About Charity fundraiser in the Last Year: Never true  . Ran Out of Food in the Last Year: Never true  Transportation Needs: No Transportation Needs  . Lack of Transportation (Medical): No  . Lack of Transportation (Non-Medical): No  Physical Activity: Sufficiently Active  . Days of Exercise per Week: 3 days  . Minutes of Exercise per Session: 60 min  Stress: No Stress Concern Present  . Feeling of Stress : Not at all  Social Connections: Somewhat Isolated  . Frequency of Communication with Friends and Family: More than three times a week  . Frequency of Social Gatherings with  Friends and Family: Once a week  . Attends Religious Services: More than 4 times per year  . Active Member of Clubs or Organizations: No  . Attends Archivist Meetings: Never  . Marital Status: Divorced  Human resources officer Violence: Not At Risk  . Fear of Current or Ex-Partner: No  . Emotionally Abused: No  . Physically Abused: No  . Sexually Abused: No   Social History   Tobacco Use  Smoking Status Never Smoker  Smokeless Tobacco Never Used   Social History   Substance and Sexual Activity  Alcohol Use Yes   Comment: 3 beers a month on average     Family History:  Family History  Problem Relation Age of Onset  . Hyperlipidemia Mother   . Hypertension Mother   . Alcohol abuse Father   . Depression Sister   . Anxiety disorder Sister   . Parkinson's disease Cousin     Past medical history, surgical history, medications, allergies, family history and social history reviewed with patient today and changes made to appropriate areas of the chart.   Review of Systems - General ROS: negative Psychological ROS: negative Ophthalmic ROS: negative ENT ROS: negative Allergy and Immunology ROS: negative Hematological and Lymphatic ROS: negative Endocrine ROS: negative Breast ROS: negative for breast lumps Respiratory ROS: no cough, shortness of breath, or wheezing Cardiovascular ROS: no chest pain or dyspnea on exertion Gastrointestinal ROS: no abdominal pain, change in bowel habits, or black or bloody stools Genito-Urinary ROS: no dysuria, trouble voiding, or hematuria Musculoskeletal ROS: negative Neurological ROS: no TIA or stroke symptoms Dermatological ROS: negative All other ROS negative except what is listed above and in the HPI.      Objective:    BP 138/79   Pulse 74   Temp 98.1 F (36.7 C) (Oral)   Ht 5\' 1"  (1.549 m)   Wt 113 lb (51.3 kg)   SpO2 98%   BMI 21.35 kg/m   Wt Readings from Last 3 Encounters:  03/27/19 113 lb (51.3 kg)  03/18/19 113 lb  (51.3 kg)  03/08/19 114 lb (51.7 kg)    Physical Exam Vitals and nursing note reviewed.  Constitutional:      General: She is not in acute distress.    Appearance: She is well-developed.  HENT:     Head: Atraumatic.     Right Ear: External ear normal.     Left Ear: External ear normal.     Nose: Nose normal.     Mouth/Throat:     Pharynx: No oropharyngeal  exudate.  Eyes:     General: No scleral icterus.    Conjunctiva/sclera: Conjunctivae normal.     Pupils: Pupils are equal, round, and reactive to light.  Neck:     Thyroid: No thyromegaly.  Cardiovascular:     Rate and Rhythm: Normal rate and regular rhythm.     Heart sounds: Normal heart sounds.  Pulmonary:     Effort: Pulmonary effort is normal. No respiratory distress.     Breath sounds: Normal breath sounds.  Chest:     Comments: Declines breast exam Abdominal:     General: Bowel sounds are normal.     Palpations: Abdomen is soft. There is no mass.     Tenderness: There is no abdominal tenderness.  Genitourinary:    Comments: Declines GU exam, done through GYN Musculoskeletal:        General: No tenderness. Normal range of motion.     Cervical back: Normal range of motion and neck supple.  Lymphadenopathy:     Cervical: No cervical adenopathy.  Skin:    General: Skin is warm and dry.     Findings: No rash.  Neurological:     Mental Status: She is alert and oriented to person, place, and time.     Cranial Nerves: No cranial nerve deficit.  Psychiatric:        Behavior: Behavior normal.     Results for orders placed or performed during the hospital encounter of 01/02/19  Urinalysis, Complete w Microscopic (For [facility] ONLY)  Result Value Ref Range   Color, Urine YELLOW YELLOW   APPearance CLEAR CLEAR   Specific Gravity, Urine 1.025 1.005 - 1.030   pH 6.0 5.0 - 8.0   Glucose, UA NEGATIVE NEGATIVE mg/dL   Hgb urine dipstick SMALL (A) NEGATIVE   Bilirubin Urine NEGATIVE NEGATIVE   Ketones, ur NEGATIVE  NEGATIVE mg/dL   Protein, ur NEGATIVE NEGATIVE mg/dL   Nitrite NEGATIVE NEGATIVE   Leukocytes,Ua TRACE (A) NEGATIVE   Squamous Epithelial / LPF 0-5 0 - 5   WBC, UA 6-10 0 - 5 WBC/hpf   RBC / HPF 6-10 0 - 5 RBC/hpf   Bacteria, UA NONE SEEN NONE SEEN      Assessment & Plan:   Problem List Items Addressed This Visit      Cardiovascular and Mediastinum   Essential hypertension    BPs stable and WNL, continue current regimen      Relevant Medications   lisinopril-hydrochlorothiazide (ZESTORETIC) 20-12.5 MG tablet   Other Relevant Orders   CBC with Differential/Platelet out   Comprehensive metabolic panel   UA/M w/rflx Culture, Routine   TSH     Other   Hyperlipidemia - Primary    Recheck lipids, continue good diet and exercise habits      Relevant Medications   lisinopril-hydrochlorothiazide (ZESTORETIC) 20-12.5 MG tablet   Other Relevant Orders   Lipid Panel w/o Chol/HDL Ratio out   Major depression, recurrent, chronic (HCC)    Stable and under good control, continue current regimen      Relevant Medications   venlafaxine XR (EFFEXOR XR) 75 MG 24 hr capsule   buPROPion (WELLBUTRIN XL) 300 MG 24 hr tablet    Other Visit Diagnoses    Annual physical exam       Left foot pain       From suspected insect bite/cellulitis. Nearly resolved, continue epsom salt soaks, topical pain relievers       Follow up plan: Return in about 6 months (  around 09/24/2019) for 6 month f/u.   LABORATORY TESTING:  - Pap smear: not applicable  IMMUNIZATIONS:   - Tdap: Tetanus vaccination status reviewed: last tetanus booster within 10 years. - Influenza: Refused - Pneumovax: Up to date - Prevnar: Up to date - HPV: Not applicable - Zostavax vaccine: Refused  SCREENING: -Mammogram: Refused  - Colonoscopy: Refused  - Bone Density: Refused   PATIENT COUNSELING:   Advised to take 1 mg of folate supplement per day if capable of pregnancy.   Sexuality: Discussed sexually transmitted  diseases, partner selection, use of condoms, avoidance of unintended pregnancy  and contraceptive alternatives.   Advised to avoid cigarette smoking.  I discussed with the patient that most people either abstain from alcohol or drink within safe limits (<=14/week and <=4 drinks/occasion for males, <=7/weeks and <= 3 drinks/occasion for females) and that the risk for alcohol disorders and other health effects rises proportionally with the number of drinks per week and how often a drinker exceeds daily limits.  Discussed cessation/primary prevention of drug use and availability of treatment for abuse.   Diet: Encouraged to adjust caloric intake to maintain  or achieve ideal body weight, to reduce intake of dietary saturated fat and total fat, to limit sodium intake by avoiding high sodium foods and not adding table salt, and to maintain adequate dietary potassium and calcium preferably from fresh fruits, vegetables, and low-fat dairy products.    stressed the importance of regular exercise  Injury prevention: Discussed safety belts, safety helmets, smoke detector, smoking near bedding or upholstery.   Dental health: Discussed importance of regular tooth brushing, flossing, and dental visits.    NEXT PREVENTATIVE PHYSICAL DUE IN 1 YEAR. Return in about 6 months (around 09/24/2019) for 6 month f/u.

## 2019-03-27 NOTE — Assessment & Plan Note (Signed)
Recheck lipids, continue good diet and exercise habits 

## 2019-03-28 LAB — CBC WITH DIFFERENTIAL/PLATELET
Basophils Absolute: 0 10*3/uL (ref 0.0–0.2)
Basos: 1 %
EOS (ABSOLUTE): 0.2 10*3/uL (ref 0.0–0.4)
Eos: 3 %
Hematocrit: 44.8 % (ref 34.0–46.6)
Hemoglobin: 14.9 g/dL (ref 11.1–15.9)
Immature Grans (Abs): 0 10*3/uL (ref 0.0–0.1)
Immature Granulocytes: 0 %
Lymphocytes Absolute: 1 10*3/uL (ref 0.7–3.1)
Lymphs: 21 %
MCH: 29.2 pg (ref 26.6–33.0)
MCHC: 33.3 g/dL (ref 31.5–35.7)
MCV: 88 fL (ref 79–97)
Monocytes Absolute: 0.5 10*3/uL (ref 0.1–0.9)
Monocytes: 11 %
Neutrophils Absolute: 3.1 10*3/uL (ref 1.4–7.0)
Neutrophils: 64 %
Platelets: 340 10*3/uL (ref 150–450)
RBC: 5.1 x10E6/uL (ref 3.77–5.28)
RDW: 12.2 % (ref 11.7–15.4)
WBC: 4.7 10*3/uL (ref 3.4–10.8)

## 2019-03-28 LAB — COMPREHENSIVE METABOLIC PANEL
ALT: 18 IU/L (ref 0–32)
AST: 23 IU/L (ref 0–40)
Albumin/Globulin Ratio: 1.6 (ref 1.2–2.2)
Albumin: 4.5 g/dL (ref 3.8–4.8)
Alkaline Phosphatase: 76 IU/L (ref 39–117)
BUN/Creatinine Ratio: 13 (ref 12–28)
BUN: 10 mg/dL (ref 8–27)
Bilirubin Total: 0.5 mg/dL (ref 0.0–1.2)
CO2: 24 mmol/L (ref 20–29)
Calcium: 10.1 mg/dL (ref 8.7–10.3)
Chloride: 100 mmol/L (ref 96–106)
Creatinine, Ser: 0.78 mg/dL (ref 0.57–1.00)
GFR calc Af Amer: 92 mL/min/{1.73_m2} (ref >59)
GFR calc non Af Amer: 79 mL/min/{1.73_m2} (ref >59)
Globulin, Total: 2.8 g/dL (ref 1.5–4.5)
Glucose: 72 mg/dL (ref 65–99)
Potassium: 5.6 mmol/L — ABNORMAL HIGH (ref 3.5–5.2)
Sodium: 136 mmol/L (ref 134–144)
Total Protein: 7.3 g/dL (ref 6.0–8.5)

## 2019-03-28 LAB — LIPID PANEL W/O CHOL/HDL RATIO
Cholesterol, Total: 197 mg/dL (ref 100–199)
HDL: 76 mg/dL (ref >39)
LDL Chol Calc (NIH): 113 mg/dL — ABNORMAL HIGH (ref 0–99)
Triglycerides: 44 mg/dL (ref 0–149)
VLDL Cholesterol Cal: 8 mg/dL (ref 5–40)

## 2019-03-28 LAB — TSH: TSH: 1.61 u[IU]/mL (ref 0.450–4.500)

## 2019-03-29 LAB — UA/M W/RFLX CULTURE, ROUTINE
Bilirubin, UA: NEGATIVE
Glucose, UA: NEGATIVE
Ketones, UA: NEGATIVE
Nitrite, UA: NEGATIVE
Protein,UA: NEGATIVE
Specific Gravity, UA: 1.02 (ref 1.005–1.030)
Urobilinogen, Ur: 0.2 mg/dL (ref 0.2–1.0)
pH, UA: 7 (ref 5.0–7.5)

## 2019-03-29 LAB — MICROSCOPIC EXAMINATION: Bacteria, UA: NONE SEEN

## 2019-03-29 LAB — URINE CULTURE, REFLEX: Organism ID, Bacteria: NO GROWTH

## 2019-03-31 ENCOUNTER — Encounter: Payer: Self-pay | Admitting: Family Medicine

## 2019-04-01 ENCOUNTER — Telehealth: Payer: Self-pay | Admitting: Family Medicine

## 2019-04-01 DIAGNOSIS — R208 Other disturbances of skin sensation: Secondary | ICD-10-CM

## 2019-04-01 DIAGNOSIS — R200 Anesthesia of skin: Secondary | ICD-10-CM

## 2019-04-01 NOTE — Telephone Encounter (Signed)
LVM for patient to return phone call.  

## 2019-04-01 NOTE — Telephone Encounter (Signed)
Pt called to report that her left [place] To[place] feels numb from time to time and that her insect bite has healed.

## 2019-04-01 NOTE — Telephone Encounter (Signed)
Is this regarding her left foot issues of late? She had declined this when we met about it because it was nearly completely resolved so I want to clarify reason

## 2019-04-01 NOTE — Telephone Encounter (Signed)
Copied from CRM [id number]. Topic: General - Other >> Apr 01, 2019  9:59 AM Tamela Oddi wrote: Reason for CRM: Patient would like a referral to a podiatrist.  CB# [phone]

## 2019-04-01 NOTE — Telephone Encounter (Signed)
Called pt to get the following details 1.Why does pt need to be referred? 2.Has pt been seen for this issue? 3.Is there a particular office or provider pt would like to see? 4.Phone number to contact pt at?

## 2019-04-02 NOTE — Telephone Encounter (Signed)
Routing to provider for referral

## 2019-04-02 NOTE — Telephone Encounter (Signed)
Will place referral if that's what she would like

## 2019-04-02 NOTE — Telephone Encounter (Signed)
Patient called back and wants to wait her foot pain out and postpone a referral to the foot doctor. Explained if patient needed anything to call us back.  Patient understood.

## 2019-04-02 NOTE — Telephone Encounter (Signed)
LVM for patient to return phone call if she wants [facility] to put in referral and does she have a specific place if so.

## 2019-04-06 ENCOUNTER — Other Ambulatory Visit: Payer: Self-pay

## 2019-04-06 ENCOUNTER — Ambulatory Visit: Payer: Medicare Other | Admitting: Urology

## 2019-04-06 ENCOUNTER — Encounter: Payer: Self-pay | Admitting: Urology

## 2019-04-06 VITALS — BP 140/67 | HR 87 | Ht 61.0 in | Wt 115.0 lb

## 2019-04-06 DIAGNOSIS — N950 Postmenopausal bleeding: Secondary | ICD-10-CM

## 2019-04-06 DIAGNOSIS — N362 Urethral caruncle: Secondary | ICD-10-CM | POA: Diagnosis not present

## 2019-04-06 DIAGNOSIS — R3129 Other microscopic hematuria: Secondary | ICD-10-CM

## 2019-04-06 DIAGNOSIS — N3281 Overactive bladder: Secondary | ICD-10-CM

## 2019-04-06 LAB — BLADDER SCAN AMB NON-IMAGING

## 2019-04-06 NOTE — Progress Notes (Signed)
04/06/2019 12:11 PM   Eileen Stewart 04/28/1950 [id number]  Referring provider: Particia Nearing, PA-C [id number] [place],  [place] [address]  Chief Complaint  Patient presents with  . Hematuria    82month    HPI: Eileen Stewart is a 69 year old female with an urethral caruncle, microscopic hematuria and post menopausal bleeding who presents today for follow up.  Urethral caruncle She states that she has been mostly consistent with applying the vaginal estrogen cream on Monday, Wednesday and Friday evenings.  She states that she has missed a few days here and there.  She still continues to have a small amount of brownish discharge in her liners.  Microscopic hematuria (high risk) Non smoker.  Cystoscopy 01/02/2019 with Dr. Apolinar Junes was NED.  Hematuria likely due urethral caruncle.  UA at PCP was positive for 3-10 RBC's on 03/27/2019 with negative urine culture.  She denies any gross hematuria.    Post menopausal bleeding Underwent endometrial biopsy with Dr. Tiburcio Pea 11/19/2018.  Findings for benign tissue.  Continues to have brown spots in underwear x one year.    OAB The patient is  experiencing urgency x 0-3, frequency x 4-7, not restricting fluids to avoid visits to the restroom, not engaging in toilet mapping, incontinence x 0-3 and nocturia x 0-3.   Her BP is 140/67.   Her PVR is 21 mL.  At goal with oxybutynin XL 10 mg daily.    PMH: Past Medical History:  Diagnosis Date  . Acute pyelonephritis   . Alcohol abuse    Alcohol, remission since 2013  . Anemia   . Asymptomatic varicose veins, unspecified laterality   . Depression   . Essential tremor   . Hyperlipidemia   . Hypertension   . Liver mass   . Vitamin D deficiency     Surgical History: Past Surgical History:  Procedure Laterality Date  . SPLENECTOMY      Home Medications:  Allergies as of 04/06/2019   No Known Allergies     Medication List       Accurate as of April 06, 2019 12:11 PM. If you  have any questions, ask your nurse or doctor.        aspirin EC 81 MG tablet Take by mouth.   buPROPion 300 MG 24 hr tablet Commonly known as: WELLBUTRIN XL TK 1 T PO D   CALCIUM 500/D PO Take by mouth.   lisinopril-hydrochlorothiazide 20-12.5 MG tablet Commonly known as: ZESTORETIC Take 1 tablet by mouth daily.   oxybutynin 10 MG 24 hr tablet Commonly known as: DITROPAN-XL Take 1 tablet (10 mg total) by mouth daily.   venlafaxine XR 75 MG 24 hr capsule Commonly known as: Effexor XR Take 1 capsule (75 mg total) by mouth daily with breakfast.       Allergies: No Known Allergies  Family History: Family History  Problem Relation Age of Onset  . Hyperlipidemia Mother   . Hypertension Mother   . Alcohol abuse Father   . Depression Sister   . Anxiety disorder Sister   . Parkinson's disease Cousin     Social History:  reports that she has never smoked. She has never used smokeless tobacco. She reports current alcohol use. She reports that she does not use drugs.  ROS: UROLOGY Frequent Urination?: No Hard to postpone urination?: No Burning/pain with urination?: No Get up at night to urinate?: Yes Leakage of urine?: No Urine stream starts and stops?: No Trouble starting stream?:  No Do you have to strain to urinate?: No Blood in urine?: No Urinary tract infection?: No Sexually transmitted disease?: No Injury to kidneys or bladder?: No Painful intercourse?: No Weak stream?: No Currently pregnant?: No Vaginal bleeding?: No Last menstrual period?: n  Gastrointestinal Nausea?: No Vomiting?: No Indigestion/heartburn?: No Diarrhea?: No Constipation?: No  Constitutional Fever: No Night sweats?: No Weight loss?: No Fatigue?: No  Skin Skin rash/lesions?: No Itching?: No  Eyes Blurred vision?: No Double vision?: No  Ears/Nose/Throat Sore throat?: No Sinus problems?: No  Hematologic/Lymphatic Swollen glands?: No Easy bruising?:  No  Cardiovascular Leg swelling?: No Chest pain?: No  Respiratory Cough?: No Shortness of breath?: No  Endocrine Excessive thirst?: No  Musculoskeletal Back pain?: No Joint pain?: No  Neurological Headaches?: No Dizziness?: No  Psychologic Depression?: Yes Anxiety?: Yes  Physical Exam: BP 140/67   Pulse 87   Ht 5\' 1"  (1.549 m)   Wt 115 lb (52.2 kg)   BMI 21.73 kg/m   Constitutional:  Well nourished. Alert and oriented, No acute distress. HEENT: [place] AT, mask in place.  Trachea midline, no masses. Cardiovascular: No clubbing, cyanosis, or edema. Respiratory: Normal respiratory effort, no increased work of breathing. GI: Abdomen is soft, non tender, non distended, no abdominal masses. Liver and spleen not palpable.  No hernias appreciated.  Stool sample for occult testing is not indicated.   GU: No CVA tenderness.  No bladder fullness or masses.  Atrophic external genitalia, sparse pubic hair distribution, no lesions.  Urethral caruncle still present and appears a beefy red.  No bladder fullness, tenderness or masses. Pale vagina mucosa, good estrogen effect, no discharge, no lesions, fair pelvic support, grade II cystocele and no rectocele noted.  Anus and perineum are without rashes or lesions.    Skin: No rashes, bruises or suspicious lesions. Lymph: No inguinal adenopathy. Neurologic: Grossly intact, no focal deficits, moving all 4 extremities. Psychiatric: Normal mood and affect.   Laboratory Data: Lab Results  Component Value Date   WBC 4.7 03/27/2019   HGB 14.9 03/27/2019   HCT 44.8 03/27/2019   MCV 88 03/27/2019   PLT 340 03/27/2019    Lab Results  Component Value Date   CREATININE 0.78 03/27/2019    No results found for: PSA  No results found for: TESTOSTERONE  No results found for: HGBA1C  Lab Results  Component Value Date   TSH 1.610 03/27/2019       Component Value Date/Time   CHOL 197 03/27/2019 1117   HDL 76 03/27/2019 1117   LDLCALC  113 (H) 03/27/2019 1117    Lab Results  Component Value Date   AST 23 03/27/2019   Lab Results  Component Value Date   ALT 18 03/27/2019   No components found for: ALKALINEPHOPHATASE No components found for: BILIRUBINTOTAL  No results found for: ESTRADIOL  Urinalysis    Component Value Date/Time   COLORURINE YELLOW 01/02/2019 1502   APPEARANCEUR Clear 03/27/2019 1114   LABSPEC 1.025 01/02/2019 1502   PHURINE 6.0 01/02/2019 1502   GLUCOSEU Negative 03/27/2019 1114   HGBUR SMALL (A) 01/02/2019 1502   BILIRUBINUR Negative 03/27/2019 1114   KETONESUR NEGATIVE 01/02/2019 1502   PROTEINUR Negative 03/27/2019 1114   PROTEINUR NEGATIVE 01/02/2019 1502   NITRITE Negative 03/27/2019 1114   NITRITE NEGATIVE 01/02/2019 1502   LEUKOCYTESUR 1+ (A) 03/27/2019 1114   LEUKOCYTESUR TRACE (A) 01/02/2019 1502    I have reviewed the labs.   Pertinent Imaging: Results for ELAINNA, ESHLEMAN (MRN [id number])  as of 04/06/2019 14:16  Ref. Range 04/06/2019 11:56  Scan Result Unknown 57ml     Assessment & Plan:    1. Urethral caruncle Microscopic hematuria likely due to the caruncle Will have patient apply vaginal estrogen cream every night for two weeks to further reduce the size of the caruncle RTC in 2 weeks for exam  2. Microscopic hematuria Recheck UA when return in 2 weeks  3. Post menopausal bleeding Endometrial biopsy negative in 11/2018  4. OAB At goal with oxybuytnin XL 10 mg daily    Return in about 2 weeks (around 04/20/2019) for recheck .  These notes generated with voice recognition software. I apologize for typographical errors.  Michiel Cowboy, PA-C  [facility] [address]  [address] [place], [place] [address] [phone]

## 2019-04-13 ENCOUNTER — Other Ambulatory Visit: Payer: Self-pay | Admitting: Family Medicine

## 2019-04-13 DIAGNOSIS — R3129 Other microscopic hematuria: Secondary | ICD-10-CM

## 2019-04-20 ENCOUNTER — Other Ambulatory Visit: Payer: Self-pay

## 2019-04-20 ENCOUNTER — Other Ambulatory Visit
Admission: RE | Admit: 2019-04-20 | Discharge: 2019-04-20 | Disposition: A | Discharge status: Home / Self Care | Payer: Medicare Other | Attending: Urology | Admitting: Urology

## 2019-04-20 ENCOUNTER — Encounter: Payer: Self-pay | Admitting: Urology

## 2019-04-20 ENCOUNTER — Ambulatory Visit: Payer: Medicare Other | Admitting: Urology

## 2019-04-20 VITALS — BP 133/71 | HR 76 | Ht 61.0 in | Wt 114.0 lb

## 2019-04-20 DIAGNOSIS — R3129 Other microscopic hematuria: Secondary | ICD-10-CM

## 2019-04-20 DIAGNOSIS — N362 Urethral caruncle: Secondary | ICD-10-CM

## 2019-04-20 DIAGNOSIS — N3281 Overactive bladder: Secondary | ICD-10-CM | POA: Diagnosis not present

## 2019-04-20 LAB — URINALYSIS, COMPLETE (UACMP) WITH MICROSCOPIC
Bilirubin Urine: NEGATIVE
Glucose, UA: NEGATIVE mg/dL
Ketones, ur: NEGATIVE mg/dL
Nitrite: NEGATIVE
Protein, ur: NEGATIVE mg/dL
Specific Gravity, Urine: 1.015 (ref 1.005–1.030)
pH: 7 (ref 5.0–8.0)

## 2019-04-20 NOTE — Progress Notes (Signed)
04/20/2019 11:41 AM   Eileen Stewart 1950-04-14 [id number]  Referring provider: Volney American, PA-C [address] [place],  [place] [address]  Chief Complaint  Patient presents with  . Follow-up    HPI: [name] is a 69 year old female with an urethral caruncle, microscopic hematuria and post menopausal bleeding who presents today for follow up.  Urethral caruncle She states that she has been mostly consistent with applying the vaginal estrogen cream on Monday, Wednesday and Friday evenings.  She states that she has missed a few days here and there.  She still continues to have a small amount of brownish discharge in her liners.  Microscopic hematuria (high risk) Non smoker.  Cystoscopy 01/02/2019 with Dr. Erlene Quan was NED.  Hematuria likely due urethral caruncle.  UA at PCP was positive for 3-10 RBC's on 03/27/2019 with negative urine culture.  She denies any gross hematuria.    Post menopausal bleeding Underwent endometrial biopsy with Dr. Kenton Kingfisher 11/19/2018.  Findings for benign tissue.  Continues to have brown spots in underwear x one year.    OAB The patient is  experiencing urgency x 0-3, frequency x 4-7, not restricting fluids to avoid visits to the restroom, not engaging in toilet mapping, incontinence x 0-3 and nocturia x 0-3.   Her BP is 140/67.   Her PVR is 21 mL.  At goal with oxybutynin XL 10 mg daily.    PMH: Past Medical History:  Diagnosis Date  . Acute pyelonephritis   . Alcohol abuse    Alcohol, remission since 2013  . Anemia   . Asymptomatic varicose veins, unspecified laterality   . Depression   . Essential tremor   . Hyperlipidemia   . Hypertension   . Liver mass   . Vitamin D deficiency     Surgical History: Past Surgical History:  Procedure Laterality Date  . SPLENECTOMY      Home Medications:  Allergies as of 04/20/2019   No Known Allergies     Medication List       Accurate as of April 20, 2019 11:41 AM. If you have any  questions, ask your nurse or doctor.        aspirin EC 81 MG tablet Take by mouth.   buPROPion 300 MG 24 hr tablet Commonly known as: WELLBUTRIN XL TK 1 T PO D   CALCIUM 500/D PO Take by mouth.   lisinopril-hydrochlorothiazide 20-12.5 MG tablet Commonly known as: ZESTORETIC Take 1 tablet by mouth daily.   oxybutynin 10 MG 24 hr tablet Commonly known as: DITROPAN-XL Take 1 tablet (10 mg total) by mouth daily.   venlafaxine XR 75 MG 24 hr capsule Commonly known as: Effexor XR Take 1 capsule (75 mg total) by mouth daily with breakfast.       Allergies: No Known Allergies  Family History: Family History  Problem Relation Age of Onset  . Hyperlipidemia Mother   . Hypertension Mother   . Alcohol abuse Father   . Depression Sister   . Anxiety disorder Sister   . Parkinson's disease Cousin     Social History:  reports that she has never smoked. She has never used smokeless tobacco. She reports current alcohol use. She reports that she does not use drugs.  ROS: UROLOGY Frequent Urination?: No Hard to postpone urination?: No Burning/pain with urination?: No Get up at night to urinate?: No Leakage of urine?: No Urine stream starts and stops?: No Trouble starting stream?: No Do you have  to strain to urinate?: No Blood in urine?: No Urinary tract infection?: No Sexually transmitted disease?: No Injury to kidneys or bladder?: No Painful intercourse?: No Weak stream?: No Currently pregnant?: No Vaginal bleeding?: No Last menstrual period?: n  Gastrointestinal Nausea?: No Vomiting?: No Indigestion/heartburn?: No Diarrhea?: No Constipation?: No  Constitutional Fever: No Night sweats?: No Weight loss?: No Fatigue?: No  Skin Skin rash/lesions?: No Itching?: No  Eyes Blurred vision?: No Double vision?: No  Ears/Nose/Throat Sore throat?: No Sinus problems?: No  Hematologic/Lymphatic Swollen glands?: No Easy bruising?: No  Cardiovascular Leg  swelling?: No Chest pain?: No  Respiratory Cough?: No Shortness of breath?: No  Endocrine Excessive thirst?: No  Musculoskeletal Back pain?: No Joint pain?: No  Neurological Headaches?: No Dizziness?: No  Psychologic Depression?: No Anxiety?: No  Physical Exam: BP 133/71   Pulse 76   Ht 5\' 1"  (1.549 m)   Wt 114 lb (51.7 kg)   BMI 21.54 kg/m   Constitutional:  Well nourished. Alert and oriented, No acute distress. HEENT: [place] AT, mask in place.  Trachea midline, no masses. Cardiovascular: No clubbing, cyanosis, or edema. Respiratory: Normal respiratory effort, no increased work of breathing. GU: No CVA tenderness.  No bladder fullness or masses.  Atrophic external genitalia, sparse pubic hair distribution, no lesions.  Small urethral caruncle noted.  No bladder fullness, tenderness or masses. Pale vagina mucosa, good estrogen effect, no discharge, no lesions.   Neurologic: Grossly intact, no focal deficits, moving all 4 extremities. Psychiatric: Normal mood and affect.   Laboratory Data: Lab Results  Component Value Date   WBC 4.7 03/27/2019   HGB 14.9 03/27/2019   HCT 44.8 03/27/2019   MCV 88 03/27/2019   PLT 340 03/27/2019    Lab Results  Component Value Date   CREATININE 0.78 03/27/2019    No results found for: PSA  No results found for: TESTOSTERONE  No results found for: HGBA1C  Lab Results  Component Value Date   TSH 1.610 03/27/2019       Component Value Date/Time   CHOL 197 03/27/2019 1117   HDL 76 03/27/2019 1117   LDLCALC 113 (H) 03/27/2019 1117    Lab Results  Component Value Date   AST 23 03/27/2019   Lab Results  Component Value Date   ALT 18 03/27/2019   No components found for: ALKALINEPHOPHATASE No components found for: BILIRUBINTOTAL  No results found for: ESTRADIOL  Urinalysis Component     Latest Ref Rng & Units 04/20/2019  Color, Urine     YELLOW YELLOW  Appearance     CLEAR CLEAR  Specific Gravity, Urine      1.005 - 1.030 1.015  pH     5.0 - 8.0 7.0  Glucose, UA     NEGATIVE mg/dL NEGATIVE  Hgb urine dipstick     NEGATIVE TRACE (A)  Bilirubin Urine     NEGATIVE NEGATIVE  Ketones, ur     NEGATIVE mg/dL NEGATIVE  Protein     NEGATIVE mg/dL NEGATIVE  Nitrite     NEGATIVE NEGATIVE  Leukocytes,Ua     NEGATIVE SMALL (A)  Squamous Epithelial / LPF     0 - 5 0-5  WBC, UA     0 - 5 WBC/hpf 11-20  RBC / HPF     0 - 5 RBC/hpf 0-5  Bacteria, UA     NONE SEEN FEW (A)  WBC Clumps      PRESENT   I have reviewed the labs.  Pertinent Imaging: Results for SHAQUISHA, WYNN (MRN [id number]) as of 04/06/2019 14:16  Ref. Range 04/06/2019 11:56  Scan Result Unknown 89ml     Assessment & Plan:    1. Urethral caruncle Will have her continue the vaginal estrogen cream on Monday, Wednesday and Fridays She will return in 1 month for exam  2. Microscopic hematuria UA is normal today No reports of gross hematuria  3. Post menopausal bleeding Endometrial biopsy negative in 11/2018  4. OAB At goal with oxybuytnin XL 10 mg daily    Return in about 1 month (around 05/18/2019) for exam and UA .  These notes generated with voice recognition software. I apologize for typographical errors.  Michiel Cowboy, PA-C  [facility] [address]  [address] [place], [place] [address] [phone]

## 2019-05-04 ENCOUNTER — Telehealth: Payer: Self-pay | Admitting: Family Medicine

## 2019-05-04 NOTE — Telephone Encounter (Signed)
Called patient to schedule appt per referral, Patient stated that her problem was much better and that she decided not to go with th referral at this time.

## 2019-05-08 ENCOUNTER — Ambulatory Visit: Payer: Medicare Other | Attending: Internal Medicine

## 2019-05-08 ENCOUNTER — Other Ambulatory Visit: Payer: Self-pay

## 2019-05-08 DIAGNOSIS — Z23 Encounter for immunization: Secondary | ICD-10-CM

## 2019-05-08 NOTE — Progress Notes (Signed)
   Covid-19 Vaccination Clinic  Name:  Alexxa Sabet    MRN: [id number] DOB: [birth year]  05/08/2019  Ms. Fyfe was observed post Covid-19 immunization for 15 minutes without incidence. She was provided with Vaccine Information Sheet and instruction to access the V-Safe system.   Ms. Ames was instructed to call 911 with any severe reactions post vaccine: Marland Kitchen Difficulty breathing  . Swelling of your face and throat  . A fast heartbeat  . A bad rash all over your body  . Dizziness and weakness    Immunizations Administered    Name Date Dose VIS Date Route   Pfizer COVID-19 Vaccine 05/08/2019 11:36 AM 0.3 mL 02/20/2019 Intramuscular   Manufacturer: [employer], [employer]   Lot: [id number]   NDC: [address]-2

## 2019-05-18 ENCOUNTER — Ambulatory Visit: Payer: Medicare Other | Admitting: Urology

## 2019-05-21 ENCOUNTER — Other Ambulatory Visit: Payer: Self-pay | Admitting: Family Medicine

## 2019-06-02 ENCOUNTER — Ambulatory Visit: Payer: Medicare Other | Attending: Internal Medicine

## 2019-06-02 DIAGNOSIS — Z23 Encounter for immunization: Secondary | ICD-10-CM

## 2019-06-02 NOTE — Progress Notes (Signed)
   Covid-19 Vaccination Clinic  Name:  Eileen Stewart    MRN: [id number] DOB: 05/06/1950  06/02/2019  Eileen Stewart was observed post Covid-19 immunization for 15 minutes without incident. She was provided with Vaccine Information Sheet and instruction to access the V-Safe system.   Eileen Stewart was instructed to call 911 with any severe reactions post vaccine: Marland Kitchen Difficulty breathing  . Swelling of face and throat  . A fast heartbeat  . A bad rash all over body  . Dizziness and weakness   Immunizations Administered    Name Date Dose VIS Date Route   Pfizer COVID-19 Vaccine 06/02/2019  4:10 PM 0.3 mL 02/20/2019 Intramuscular   Manufacturer: [employer], [employer]   Lot: [id number]   NDC: [address]-9

## 2019-06-04 ENCOUNTER — Other Ambulatory Visit: Payer: Self-pay

## 2019-06-04 DIAGNOSIS — R3129 Other microscopic hematuria: Secondary | ICD-10-CM

## 2019-06-08 ENCOUNTER — Other Ambulatory Visit
Admission: RE | Admit: 2019-06-08 | Discharge: 2019-06-08 | Disposition: A | Discharge status: Home / Self Care | Payer: Medicare Other | Attending: Urology | Admitting: Urology

## 2019-06-08 ENCOUNTER — Encounter: Payer: Self-pay | Admitting: Urology

## 2019-06-08 ENCOUNTER — Other Ambulatory Visit: Payer: Self-pay

## 2019-06-08 ENCOUNTER — Ambulatory Visit: Payer: Medicare Other | Admitting: Urology

## 2019-06-08 VITALS — BP 138/76 | HR 75 | Ht 61.0 in | Wt 114.0 lb

## 2019-06-08 DIAGNOSIS — N362 Urethral caruncle: Secondary | ICD-10-CM | POA: Diagnosis not present

## 2019-06-08 DIAGNOSIS — N3281 Overactive bladder: Secondary | ICD-10-CM | POA: Diagnosis not present

## 2019-06-08 DIAGNOSIS — R3129 Other microscopic hematuria: Secondary | ICD-10-CM | POA: Insufficient documentation

## 2019-06-08 LAB — URINALYSIS, COMPLETE (UACMP) WITH MICROSCOPIC
Bilirubin Urine: NEGATIVE
Glucose, UA: NEGATIVE mg/dL
Ketones, ur: NEGATIVE mg/dL
Leukocytes,Ua: NEGATIVE
Nitrite: NEGATIVE
Protein, ur: NEGATIVE mg/dL
Specific Gravity, Urine: 1.02 (ref 1.005–1.030)
pH: 7.5 (ref 5.0–8.0)

## 2019-06-08 NOTE — Progress Notes (Signed)
06/08/2019 9:01 AM   [name] 07-16-1950 [id number]  Referring provider: Volney American, PA-C [address] [place],  [place] [address]  Chief Complaint  Patient presents with  . Hematuria    49month    HPI: Mrs. Kittle is a 69 year old female with an urethral caruncle, microscopic hematuria and post menopausal bleeding who presents today for follow up.  Urethral caruncle She states that she has been consistent with applying the vaginal estrogen cream Monday, Wednesday nights and Friday nights.  She has not noted any spotting in her underwear or any episodes of gross hematuria.  She states that she feels the area is getting smaller when she examines it in the mirror.  Microscopic hematuria (high risk) Non smoker.  Cystoscopy 01/02/2019 with Dr. Erlene Quan was NED.  Hematuria likely due urethral caruncle.  UA at PCP was positive for 3-10 RBC's on 03/27/2019 with negative urine culture.  She denies any gross hematuria.  Subsequent UAs have not demonstrated micro heme.  Post menopausal bleeding Underwent endometrial biopsy with Dr. Kenton Kingfisher 11/19/2018.  Findings for benign tissue.  No reports of brown spots on underwear today  OAB Patient continues the oxybutynin XL 10 mg daily.  She states that she has noted a worsening in her urinary symptoms over the last several weeks.  She states on one particular occasion, she went for a walk and when she returned to her car she states her incontinent pad was wet and so was her clothing.  She stated that she did not even realize that she had an episode of incontinence.  She is also noted an increase in her urinary urgency as well.  UA is yellow hazy, specific gravity 1.020, pH 7.50 trace blood, 0-5 squamous epithelial cells, 0-5 WBCs, 0-5 RBCs and a few bacteria.    PMH: Past Medical History:  Diagnosis Date  . Acute pyelonephritis   . Alcohol abuse    Alcohol, remission since 2013  . Anemia   . Asymptomatic varicose veins,  unspecified laterality   . Depression   . Essential tremor   . Hyperlipidemia   . Hypertension   . Liver mass   . Vitamin D deficiency     Surgical History: Past Surgical History:  Procedure Laterality Date  . SPLENECTOMY      Home Medications:  Allergies as of 06/08/2019   No Known Allergies     Medication List       Accurate as of June 08, 2019 11:59 PM. If you have any questions, ask your nurse or doctor.        aspirin EC 81 MG tablet Take by mouth.   buPROPion 300 MG 24 hr tablet Commonly known as: WELLBUTRIN XL TK 1 T PO D   CALCIUM 500/D PO Take by mouth.   lisinopril-hydrochlorothiazide 20-12.5 MG tablet Commonly known as: ZESTORETIC TAKE 1 TABLET BY MOUTH DAILY   oxybutynin 10 MG 24 hr tablet Commonly known as: DITROPAN-XL Take 1 tablet (10 mg total) by mouth daily.   venlafaxine XR 75 MG 24 hr capsule Commonly known as: Effexor XR Take 1 capsule (75 mg total) by mouth daily with breakfast.       Allergies: No Known Allergies  Family History: Family History  Problem Relation Age of Onset  . Hyperlipidemia Mother   . Hypertension Mother   . Alcohol abuse Father   . Depression Sister   . Anxiety disorder Sister   . Parkinson's disease Cousin  Social History:  reports that she has never smoked. She has never used smokeless tobacco. She reports current alcohol use. She reports that she does not use drugs.  ROS: For pertinent review of systems please refer to history of present illness  Physical Exam: BP 138/76   Pulse 75   Ht 5\' 1"  (1.549 m)   Wt 114 lb (51.7 kg)   BMI 21.54 kg/m   Constitutional:  Well nourished. Alert and oriented, No acute distress. HEENT: [place] AT, mask in place.  Trachea midline, no masses. Cardiovascular: No clubbing, cyanosis, or edema. Respiratory: Normal respiratory effort, no increased work of breathing. GU: Atrophic external genitalia, sparse pubic hair distribution, no lesions.  Normal urethral meatus,  no lesions, no prolapse, no discharge. A small urethral caruncle is still present.    Neurologic: Grossly intact, no focal deficits, moving all 4 extremities. Psychiatric: Normal mood and affect.   Laboratory Data: Lab Results  Component Value Date   WBC 4.7 03/27/2019   HGB 14.9 03/27/2019   HCT 44.8 03/27/2019   MCV 88 03/27/2019   PLT 340 03/27/2019    Lab Results  Component Value Date   CREATININE 0.78 03/27/2019    Lab Results  Component Value Date   TSH 1.610 03/27/2019       Component Value Date/Time   CHOL 197 03/27/2019 1117   HDL 76 03/27/2019 1117   LDLCALC 113 (H) 03/27/2019 1117    Lab Results  Component Value Date   AST 23 03/27/2019   Lab Results  Component Value Date   ALT 18 03/27/2019    Urinalysis Component     Latest Ref Rng & Units 06/08/2019  Color, Urine     YELLOW YELLOW  Appearance     CLEAR HAZY (A)  Specific Gravity, Urine     1.005 - 1.030 1.020  pH     5.0 - 8.0 7.5  Glucose, UA     NEGATIVE mg/dL NEGATIVE  Hgb urine dipstick     NEGATIVE TRACE (A)  Bilirubin Urine     NEGATIVE NEGATIVE  Ketones, ur     NEGATIVE mg/dL NEGATIVE  Protein     NEGATIVE mg/dL NEGATIVE  Nitrite     NEGATIVE NEGATIVE  Leukocytes,Ua     NEGATIVE NEGATIVE  Squamous Epithelial / LPF     0 - 5 0-5  WBC, UA     0 - 5 WBC/hpf 0-5  RBC / HPF     0 - 5 RBC/hpf 0-5  Bacteria, UA     NONE SEEN FEW (A)  Amorphous Crystal      PRESENT   I have reviewed the labs.   Pertinent Imaging: Results for AMAYIA, CIANO (MRN [name]) as of 04/06/2019 14:16  Ref. Range 04/06/2019 11:56  Scan Result Unknown 27ml     Assessment & Plan:    1. Urethral caruncle She will continue applying the vaginal estrogen cream on Monday, Wednesday and Friday It is cost prohibitive for her, so we will try to provide samples for her as it can take several months for the physiologic changes to take place within the vagina She will follow-up in 6 months for  exam  2. Microscopic hematuria Likely due to urethral caruncle as microscopic hematuria has not been identified after treating the caruncle with the vaginal estrogen cream UA is negative today No reports of gross hematuria She will return in 6 months for repeat UA and report any gross hematuria in the interim  3.  Post menopausal bleeding Endometrial biopsy negative in 11/2018  4. OAB Not at goal with oxybuytnin XL 10 mg daily  We discussed trying samples of another overactive bladder agent, Myrbetriq, but she inquired if there was higher dose of oxybutynin XL available I stated that there was and since she has just filled her oxybutynin XL 10 mg prescription, she will take 2 of the oxybutynin XL 10 mg daily to see if this improves her urinary symptoms If not, she will follow-up sooner than the scheduled 6 months    Return in about 6 months (around 12/09/2019) for OAB questionnaire, PVR and exam.  These notes generated with voice recognition software. I apologize for typographical errors.  Michiel Cowboy, PA-C  [facility] [address]  [address] [place], [place] [address] [phone]

## 2019-08-21 ENCOUNTER — Other Ambulatory Visit: Payer: Self-pay

## 2019-08-21 ENCOUNTER — Encounter: Payer: Self-pay | Admitting: Unknown Physician Specialty

## 2019-08-21 ENCOUNTER — Ambulatory Visit (INDEPENDENT_AMBULATORY_CARE_PROVIDER_SITE_OTHER): Payer: Medicare Other | Admitting: Unknown Physician Specialty

## 2019-08-21 VITALS — BP 151/72 | HR 80 | Temp 97.8°F | Wt 115.6 lb

## 2019-08-21 DIAGNOSIS — I10 Essential (primary) hypertension: Secondary | ICD-10-CM | POA: Diagnosis not present

## 2019-08-21 DIAGNOSIS — R519 Headache, unspecified: Secondary | ICD-10-CM | POA: Diagnosis not present

## 2019-08-21 DIAGNOSIS — E875 Hyperkalemia: Secondary | ICD-10-CM

## 2019-08-21 NOTE — Progress Notes (Signed)
BP (!) 151/72 (BP Location: Left Arm, Cuff Size: Normal)   Pulse 80   Temp 97.8 F (36.6 C) (Oral)   Wt 115 lb 9.6 oz (52.4 kg)   SpO2 97%   BMI 21.84 kg/m    Subjective:    Patient ID: Eileen Stewart, female    DOB: 26-Apr-1950, 69 y.o.   MRN: [id number]  HPI: Eileen Stewart is a 69 y.o. female  Chief Complaint  Patient presents with  . Headache    pt states she has had a slight headache all week, except today. States she thinks she just got hot but wanted to have her BP checked   Headache  This is a new problem. The current episode started in the past 7 days. The problem occurs intermittently. The problem has been resolved. The pain is located in the occipital region. The pain does not radiate. The pain quality is not similar to prior headaches. The quality of the pain is described as aching and dull. The pain is mild. Pertinent negatives include no abdominal pain, abnormal behavior, anorexia, back pain, blurred vision, coughing, dizziness, drainage, ear pain, eye pain, eye redness, eye watering, facial sweating, fever, hearing loss, insomnia, loss of balance, muscle aches, nausea, neck pain, numbness, phonophobia, photophobia, rhinorrhea, scalp tenderness, seizures, sinus pressure, sore throat, swollen glands, tingling, tinnitus, visual change, vomiting, weakness or weight loss. Nothing aggravates the symptoms.    Relevant past medical, surgical, family and social history reviewed and updated as indicated. Interim medical history since our last visit reviewed. Allergies and medications reviewed and updated.  Review of Systems  Constitutional: Negative for fever and weight loss.  HENT: Negative for ear pain, hearing loss, rhinorrhea, sinus pressure, sore throat and tinnitus.   Eyes: Negative for blurred vision, photophobia, pain and redness.  Respiratory: Negative for cough.   Gastrointestinal: Negative for abdominal pain, anorexia, nausea and vomiting.  Musculoskeletal:  Negative for back pain and neck pain.  Neurological: Positive for headaches. Negative for dizziness, tingling, seizures, weakness, numbness and loss of balance.  Psychiatric/Behavioral: The patient does not have insomnia.     Per HPI unless specifically indicated above     Objective:    BP (!) 151/72 (BP Location: Left Arm, Cuff Size: Normal)   Pulse 80   Temp 97.8 F (36.6 C) (Oral)   Wt 115 lb 9.6 oz (52.4 kg)   SpO2 97%   BMI 21.84 kg/m   Wt Readings from Last 3 Encounters:  08/21/19 115 lb 9.6 oz (52.4 kg)  06/08/19 114 lb (51.7 kg)  04/20/19 114 lb (51.7 kg)    Physical Exam Constitutional:      General: She is not in acute distress.    Appearance: Normal appearance. She is well-developed.  HENT:     Head: Normocephalic and atraumatic.  Eyes:     General: Lids are normal. No scleral icterus.       Right eye: No discharge.        Left eye: No discharge.     Conjunctiva/sclera: Conjunctivae normal.  Neck:     Vascular: No carotid bruit or JVD.  Cardiovascular:     Rate and Rhythm: Normal rate and regular rhythm.     Heart sounds: Normal heart sounds.  Pulmonary:     Effort: Pulmonary effort is normal.     Breath sounds: Normal breath sounds.  Abdominal:     Palpations: There is no hepatomegaly or splenomegaly.  Musculoskeletal:        General:  Normal range of motion.     Cervical back: Normal range of motion and neck supple.  Skin:    General: Skin is warm and dry.     Coloration: Skin is not pale.     Findings: No rash.  Neurological:     Mental Status: She is alert and oriented to person, place, and time.  Psychiatric:        Behavior: Behavior normal.        Thought Content: Thought content normal.        Judgment: Judgment normal.     Results for orders placed or performed during the hospital encounter of 06/08/19  Urinalysis, Complete w Microscopic  Result Value Ref Range   Color, Urine YELLOW YELLOW   APPearance HAZY (A) CLEAR   Specific  Gravity, Urine 1.020 1.005 - 1.030   pH 7.5 5.0 - 8.0   Glucose, UA NEGATIVE NEGATIVE mg/dL   Hgb urine dipstick TRACE (A) NEGATIVE   Bilirubin Urine NEGATIVE NEGATIVE   Ketones, ur NEGATIVE NEGATIVE mg/dL   Protein, ur NEGATIVE NEGATIVE mg/dL   Nitrite NEGATIVE NEGATIVE   Leukocytes,Ua NEGATIVE NEGATIVE   Squamous Epithelial / LPF 0-5 0 - 5   WBC, UA 0-5 0 - 5 WBC/hpf   RBC / HPF 0-5 0 - 5 RBC/hpf   Bacteria, UA FEW (A) NONE SEEN   Amorphous Crystal PRESENT       Assessment & Plan:   Problem List Items Addressed This Visit      Unprioritized   Essential hypertension    Higher than typical for her.  Encouraged to do take her BP at home. 2 hours after evening meal since that is when her headaches started.         Other Visit Diagnoses    Nonintractable episodic headache, unspecified headache type    -  Primary   Slight headache now resolved.  Encouraged fluids and nmonitor BP at home    Hyperkalemia       Noted high last visit. Refusing checking today.  Will recheck if headaches continue       Follow up plan: Return if symptoms worsen or fail to improve.

## 2019-08-21 NOTE — Assessment & Plan Note (Signed)
Higher than typical for her.  Encouraged to do take her BP at home. 2 hours after evening meal since that is when her headaches started.

## 2019-09-15 ENCOUNTER — Other Ambulatory Visit: Payer: Self-pay | Admitting: Family Medicine

## 2019-10-30 ENCOUNTER — Other Ambulatory Visit: Payer: Self-pay | Admitting: Family Medicine

## 2019-10-30 MED ORDER — LISINOPRIL-HYDROCHLOROTHIAZIDE 20-12.5 MG PO TABS: 1.0000 | ORAL_TABLET | Freq: Every day | ORAL | 0 refills | Status: DC

## 2019-10-30 MED ORDER — OXYBUTYNIN CHLORIDE ER 10 MG PO TB24: 10.0000 mg | ORAL_TABLET | Freq: Every day | ORAL | 3 refills | Status: DC

## 2019-10-30 MED ORDER — BUPROPION HCL ER (XL) 300 MG PO TB24: ORAL_TABLET | ORAL | 0 refills | Status: DC

## 2019-10-30 MED ORDER — ASPIRIN EC 81 MG PO TBEC: 81.0000 mg | DELAYED_RELEASE_TABLET | Freq: Every day | ORAL | 1 refills | Status: DC

## 2019-10-30 NOTE — Telephone Encounter (Signed)
Medication Refill - Medication: Aspirin EC, Bupropion, Calcium Carbonate-Vitamin D, Zestoretic, Oxybutynin, Venlafaxaine  Has the patient contacted their pharmacy? No. (Agent: If no, request that the patient contact the pharmacy for the refill.) (Agent: If yes, when and what did the pharmacy advise?)  Preferred Pharmacy (with phone number or street name): [facility]  Agent: Please be advised that RX refills may take up to 3 business days. We ask that you follow-up with your pharmacy.

## 2019-10-30 NOTE — Telephone Encounter (Signed)
Notes to clinic:  review for refill  Last filled by different providers    Requested Prescriptions  Pending Prescriptions Disp Refills   aspirin EC 81 MG tablet 30 tablet     Sig: Take by mouth.      Analgesics:  NSAIDS - aspirin Passed - 10/30/2019 11:04 AM      Passed - Patient is not pregnant      Passed - Valid encounter within last 12 months    Recent Outpatient Visits           2 months ago Nonintractable episodic headache, unspecified headache type   [facility] Gabriel Cirri, NP   7 months ago Mixed hyperlipidemia   [facility] Roosvelt Maser [place], [place]   7 months ago Left foot pain   [facility] Roosvelt Maser [place], [place]   12 months ago Hematuria, unspecified type   [facility], [place] T, NP   1 year ago Major depression, recurrent, chronic [facility])   [facility] Particia Nearing, [place]       Future Appointments             In 1 month McGowan, Wellington Hampshire, PA-C [facility]              oxybutynin (DITROPAN-XL) 10 MG 24 hr tablet 90 tablet 3    Sig: Take 1 tablet (10 mg total) by mouth daily.      Urology:  Bladder Agents Passed - 10/30/2019 11:04 AM      Passed - Valid encounter within last 12 months    Recent Outpatient Visits           2 months ago Nonintractable episodic headache, unspecified headache type   [facility] Gabriel Cirri, NP   7 months ago Mixed hyperlipidemia   [facility] Roosvelt Maser [place], [place]   7 months ago Left foot pain   [facility] Roosvelt Maser [place], [place]   12 months ago Hematuria, unspecified type   [facility] [place], Jane [place] T, NP   1 year ago Major depression, recurrent, chronic (HCC)   [facility] Particia Nearing, [place]       Future Appointments             In 1 month McGowan, Elana Alm [facility]              Signed Prescriptions Disp Refills   buPROPion (WELLBUTRIN XL) 300 MG 24 hr tablet 90 tablet 0    Sig: TK 1 T PO D      Psychiatry: Antidepressants - bupropion Failed - 10/30/2019 11:04 AM      Failed - Last BP in normal range    BP Readings from Last 1 Encounters:  08/21/19 (!) 151/72          Passed - Completed PHQ-2 or PHQ-9 in the last 360 days.      Passed - Valid encounter within last 6 months    Recent Outpatient Visits           2 months ago Nonintractable episodic headache, unspecified headache type   [facility] Gabriel Cirri, NP   7 months ago Mixed hyperlipidemia   [facility] Roosvelt Maser [place], [place]   7 months ago Left foot pain   [facility] Roosvelt Maser [place], [place]   12 months ago Hematuria, unspecified type   [facility] [place],  Dorie Rank, NP   1 year ago Major depression, recurrent, chronic (HCC)   [facility] Particia Nearing, [place]       Future Appointments             In 1 month McGowan, Wellington Hampshire, PA-C [facility]              lisinopril-hydrochlorothiazide (ZESTORETIC) 20-12.5 MG tablet 90 tablet 0    Sig: Take 1 tablet by mouth daily.      Cardiovascular:  ACEI + Diuretic Combos Failed - 10/30/2019 11:04 AM      Failed - Na in normal range and within 180 days    Sodium  Date Value Ref Range Status  03/27/2019 136 134 - 144 mmol/L Final          Failed - K in normal range and within 180 days    Potassium  Date Value Ref Range Status  03/27/2019 5.6 (H) 3.5 - 5.2 mmol/L Final          Failed - Cr in normal range and within 180 days    Creatinine, Ser  Date Value Ref Range Status  03/27/2019 0.78 0.57 - 1.00 mg/dL Final          Failed - Ca in normal range and within 180 days    Calcium  Date Value Ref Range Status  03/27/2019 10.1 8.7 - 10.3 mg/dL Final          Failed - Last BP in normal range    BP  Readings from Last 1 Encounters:  08/21/19 (!) 151/72          Passed - Patient is not pregnant      Passed - Valid encounter within last 6 months    Recent Outpatient Visits           2 months ago Nonintractable episodic headache, unspecified headache type   [facility] Gabriel Cirri, NP   7 months ago Mixed hyperlipidemia   [facility] Particia Nearing, [place]   7 months ago Left foot pain   [facility] Roosvelt Maser [place], [place]   12 months ago Hematuria, unspecified type   [facility] [place] [place], [place] T, NP   1 year ago Major depression, recurrent, chronic [facility])   [facility] Particia Nearing, [place]       Future Appointments             In 1 month McGowan, Elana Alm [facility]

## 2019-12-06 NOTE — Progress Notes (Deleted)
12/07/2019 9:46 PM   Eileen Stewart 17-May-1950 [id number]  Referring provider: Particia Nearing, PA-C [address] [place],  [place] [address]  No chief complaint on file.   HPI: Eileen Stewart is a 69 year old female with an urethral caruncle, microscopic hematuria and post menopausal bleeding who presents today for follow up.  Urethral caruncle She states that she has been consistent with applying the vaginal estrogen cream Monday, Wednesday nights and Friday nights.  She has not noted any spotting in her underwear or any episodes of gross hematuria.  She states that she feels the area is getting smaller when she examines it in the mirror.  Microscopic hematuria (high risk) Non smoker.  Cystoscopy 01/02/2019 with Eileen Stewart was NED.  Hematuria likely due urethral caruncle.  UA at PCP was positive for 3-10 RBC's on 03/27/2019 with negative urine culture.  She denies any gross hematuria.  Subsequent UAs have not demonstrated micro heme.  Post menopausal bleeding Underwent endometrial biopsy with Eileen Stewart 11/19/2018.  Findings for benign tissue.  No reports of brown spots on underwear today  OAB Patient continues the oxybutynin XL 10 mg daily.  She states that she has noted a worsening in her urinary symptoms over the last several weeks.  She states on one particular occasion, she went for a walk and when she returned to her car she states her incontinent pad was wet and so was her clothing.  She stated that she did not even realize that she had an episode of incontinence.  She is also noted an increase in her urinary urgency as well.  UA is yellow hazy, specific gravity 1.020, pH 7.50 trace blood, 0-5 squamous epithelial cells, 0-5 WBCs, 0-5 RBCs and a few bacteria.    PMH: Past Medical History:  Diagnosis Date  . Acute pyelonephritis   . Alcohol abuse    Alcohol, remission since 2013  . Anemia   . Asymptomatic varicose veins, unspecified laterality   . Depression   .  Essential tremor   . Hyperlipidemia   . Hypertension   . Liver mass   . Vitamin D deficiency     Surgical History: Past Surgical History:  Procedure Laterality Date  . SPLENECTOMY      Home Medications:  Allergies as of 12/07/2019   No Known Allergies     Medication List       Accurate as of December 06, 2019  9:46 PM. If you have any questions, ask your nurse or doctor.        aspirin EC 81 MG tablet Take 1 tablet (81 mg total) by mouth daily.   buPROPion 300 MG 24 hr tablet Commonly known as: WELLBUTRIN XL TK 1 T PO D   CALCIUM 500/D PO Take by mouth.   lisinopril-hydrochlorothiazide 20-12.5 MG tablet Commonly known as: ZESTORETIC Take 1 tablet by mouth daily.   oxybutynin 10 MG 24 hr tablet Commonly known as: DITROPAN-XL Take 1 tablet (10 mg total) by mouth daily.   venlafaxine XR 75 MG 24 hr capsule Commonly known as: EFFEXOR-XR TAKE 1 CAPSULE(75 MG) BY MOUTH DAILY WITH BREAKFAST       Allergies: No Known Allergies  Family History: Family History  Problem Relation Age of Onset  . Hyperlipidemia Mother   . Hypertension Mother   . Alcohol abuse Father   . Depression Sister   . Anxiety disorder Sister   . Parkinson's disease Cousin     Social History:  reports that  she has never smoked. She has never used smokeless tobacco. She reports current alcohol use. She reports that she does not use drugs.  ROS: For pertinent review of systems please refer to history of present illness  Physical Exam: There were no vitals taken for this visit.  Constitutional:  Well nourished. Alert and oriented, No acute distress. HEENT: [place] AT, moist mucus membranes.  Trachea midline, no masses. Cardiovascular: No clubbing, cyanosis, or edema. Respiratory: Normal respiratory effort, no increased work of breathing. GI: Abdomen is soft, non tender, non distended, no abdominal masses. Liver and spleen not palpable.  No hernias appreciated.  Stool sample for occult  testing is not indicated.   GU: No CVA tenderness.  No bladder fullness or masses.  *** external genitalia, *** pubic hair distribution, no lesions.  Normal urethral meatus, no lesions, no prolapse, no discharge.   No urethral masses, tenderness and/or tenderness. No bladder fullness, tenderness or masses. *** vagina mucosa, *** estrogen effect, no discharge, no lesions, *** pelvic support, *** cystocele and *** rectocele noted.  No cervical motion tenderness.  Uterus is freely mobile and non-fixed.  No adnexal/parametria masses or tenderness noted.  Anus and perineum are without rashes or lesions.   ***  Skin: No rashes, bruises or suspicious lesions. Lymph: No cervical or inguinal adenopathy. Neurologic: Grossly intact, no focal deficits, moving all 4 extremities. Psychiatric: Normal mood and affect.   Laboratory Data: Lab Results  Component Value Date   WBC 4.7 03/27/2019   HGB 14.9 03/27/2019   HCT 44.8 03/27/2019   MCV 88 03/27/2019   PLT 340 03/27/2019    Lab Results  Component Value Date   CREATININE 0.78 03/27/2019    Lab Results  Component Value Date   TSH 1.610 03/27/2019       Component Value Date/Time   CHOL 197 03/27/2019 1117   HDL 76 03/27/2019 1117   LDLCALC 113 (H) 03/27/2019 1117    Lab Results  Component Value Date   AST 23 03/27/2019   Lab Results  Component Value Date   ALT 18 03/27/2019    Urinalysis *** I have reviewed the labs.   Pertinent Imaging: ***    Assessment & Plan:    1. Urethral caruncle She will continue applying the vaginal estrogen cream on Monday, Wednesday and Friday It is cost prohibitive for her, so we will try to provide samples for her as it can take several months for the physiologic changes to take place within the vagina She will follow-up in 6 months for exam  2. Microscopic hematuria Likely due to urethral caruncle as microscopic hematuria has not been identified after treating the caruncle with the vaginal  estrogen cream UA is negative today No reports of gross hematuria She will return in 6 months for repeat UA and report any gross hematuria in the interim  3. Post menopausal bleeding Endometrial biopsy negative in 11/2018  4. OAB Not at goal with oxybuytnin XL 10 mg daily  We discussed trying samples of another overactive bladder agent, Myrbetriq, but she inquired if there was higher dose of oxybutynin XL available I stated that there was and since she has just filled her oxybutynin XL 10 mg prescription, she will take 2 of the oxybutynin XL 10 mg daily to see if this improves her urinary symptoms If not, she will follow-up sooner than the scheduled 6 months    No follow-ups on file.  These notes generated with voice recognition software. I apologize for typographical  errors.  Zara Council, PA-C  [facility] [address]  [place], [place] [address] [phone]

## 2019-12-07 ENCOUNTER — Other Ambulatory Visit: Payer: Self-pay | Admitting: *Deleted

## 2019-12-07 ENCOUNTER — Ambulatory Visit: Payer: Medicare Other | Admitting: Urology

## 2019-12-07 DIAGNOSIS — N3281 Overactive bladder: Secondary | ICD-10-CM

## 2019-12-21 ENCOUNTER — Telehealth: Payer: Self-pay | Admitting: Urology

## 2019-12-21 NOTE — Telephone Encounter (Signed)
Please call Eileen Stewart and let her know it is time for her 6 months follow up for micro heme and OAB and have her schedule an appointment?

## 2020-01-01 ENCOUNTER — Ambulatory Visit (INDEPENDENT_AMBULATORY_CARE_PROVIDER_SITE_OTHER): Payer: Medicare Other

## 2020-01-01 VITALS — Ht 61.0 in | Wt 115.0 lb

## 2020-01-01 DIAGNOSIS — Z0000 Encounter for general adult medical examination without abnormal findings: Secondary | ICD-10-CM | POA: Diagnosis not present

## 2020-01-01 NOTE — Patient Instructions (Signed)
Eileen Stewart , Thank you for taking time to come for your Medicare Wellness Visit. I appreciate your ongoing commitment to your health goals. Please review the following plan we discussed and let me know if I can assist you in the future.   Screening recommendations/referrals: Colonoscopy: decline Mammogram: decline Bone Density: decline Recommended yearly ophthalmology/optometry visit for glaucoma screening and checkup Recommended yearly dental visit for hygiene and checkup  Vaccinations: Influenza vaccine: decline Pneumococcal vaccine: completed 11/14/2016 Tdap vaccine: completed 03/08/2019 Shingles vaccine: discussed   Covid-19: 06/02/2019, 05/08/2019  Advanced directives: Please bring a copy of your POA (Power of Attorney) and/or Living Will to your next appointment.   Conditions/risks identified: none  Next appointment: Follow up in one year for your annual wellness visit    Preventive Care 65 Years and Older, Female Preventive care refers to lifestyle choices and visits with your health care provider that can promote health and wellness. What does preventive care include?  A yearly physical exam. This is also called an annual well check.  Dental exams once or twice a year.  Routine eye exams. Ask your health care provider how often you should have your eyes checked.  Personal lifestyle choices, including:  Daily care of your teeth and gums.  Regular physical activity.  Eating a healthy diet.  Avoiding tobacco and drug use.  Limiting alcohol use.  Practicing safe sex.  Taking low-dose aspirin every day.  Taking vitamin and mineral supplements as recommended by your health care provider. What happens during an annual well check? The services and screenings done by your health care provider during your annual well check will depend on your age, overall health, lifestyle risk factors, and family history of disease. Counseling  Your health care provider may ask you  questions about your:  Alcohol use.  Tobacco use.  Drug use.  Emotional well-being.  Home and relationship well-being.  Sexual activity.  Eating habits.  History of falls.  Memory and ability to understand (cognition).  Work and work Astronomer.  Reproductive health. Screening  You may have the following tests or measurements:  Height, weight, and BMI.  Blood pressure.  Lipid and cholesterol levels. These may be checked every 5 years, or more frequently if you are over 47 years old.  Skin check.  Lung cancer screening. You may have this screening every year starting at age 48 if you have a 30-pack-year history of smoking and currently smoke or have quit within the past 15 years.  Fecal occult blood test (FOBT) of the stool. You may have this test every year starting at age 3.  Flexible sigmoidoscopy or colonoscopy. You may have a sigmoidoscopy every 5 years or a colonoscopy every 10 years starting at age 79.  Hepatitis C blood test.  Hepatitis B blood test.  Sexually transmitted disease (STD) testing.  Diabetes screening. This is done by checking your blood sugar (glucose) after you have not eaten for a while (fasting). You may have this done every 1-3 years.  Bone density scan. This is done to screen for osteoporosis. You may have this done starting at age 5.  Mammogram. This may be done every 1-2 years. Talk to your health care provider about how often you should have regular mammograms. Talk with your health care provider about your test results, treatment options, and if necessary, the need for more tests. Vaccines  Your health care provider may recommend certain vaccines, such as:  Influenza vaccine. This is recommended every year.  Tetanus, diphtheria,  and acellular pertussis (Tdap, Td) vaccine. You may need a Td booster every 10 years.  Zoster vaccine. You may need this after age 34.  Pneumococcal 13-valent conjugate (PCV13) vaccine. One dose is  recommended after age 61.  Pneumococcal polysaccharide (PPSV23) vaccine. One dose is recommended after age 37. Talk to your health care provider about which screenings and vaccines you need and how often you need them. This information is not intended to replace advice given to you by your health care provider. Make sure you discuss any questions you have with your health care provider. Document Released: 03/25/2015 Document Revised: 11/16/2015 Document Reviewed: 12/28/2014 Elsevier Interactive Patient Education  2017 [place] Prevention in the Home Falls can cause injuries. They can happen to people of all ages. There are many things you can do to make your home safe and to help prevent falls. What can I do on the outside of my home?  Regularly fix the edges of walkways and driveways and fix any cracks.  Remove anything that might make you trip as you walk through a door, such as a raised step or threshold.  Trim any bushes or trees on the path to your home.  Use bright outdoor lighting.  Clear any walking paths of anything that might make someone trip, such as rocks or tools.  Regularly check to see if handrails are loose or broken. Make sure that both sides of any steps have handrails.  Any raised decks and porches should have guardrails on the edges.  Have any leaves, snow, or ice cleared regularly.  Use sand or salt on walking paths during winter.  Clean up any spills in your garage right away. This includes oil or grease spills. What can I do in the bathroom?  Use night lights.  Install grab bars by the toilet and in the tub and shower. Do not use towel bars as grab bars.  Use non-skid mats or decals in the tub or shower.  If you need to sit down in the shower, use a plastic, non-slip stool.  Keep the floor dry. Clean up any water that spills on the floor as soon as it happens.  Remove soap buildup in the tub or shower regularly.  Attach bath mats  securely with double-sided non-slip rug tape.  Do not have throw rugs and other things on the floor that can make you trip. What can I do in the bedroom?  Use night lights.  Make sure that you have a light by your bed that is easy to reach.  Do not use any sheets or blankets that are too big for your bed. They should not hang down onto the floor.  Have a firm chair that has side arms. You can use this for support while you get dressed.  Do not have throw rugs and other things on the floor that can make you trip. What can I do in the kitchen?  Clean up any spills right away.  Avoid walking on wet floors.  Keep items that you use a lot in easy-to-reach places.  If you need to reach something above you, use a strong step stool that has a grab bar.  Keep electrical cords out of the way.  Do not use floor polish or wax that makes floors slippery. If you must use wax, use non-skid floor wax.  Do not have throw rugs and other things on the floor that can make you trip. What can I do with my  stairs?  Do not leave any items on the stairs.  Make sure that there are handrails on both sides of the stairs and use them. Fix handrails that are broken or loose. Make sure that handrails are as long as the stairways.  Check any carpeting to make sure that it is firmly attached to the stairs. Fix any carpet that is loose or worn.  Avoid having throw rugs at the top or bottom of the stairs. If you do have throw rugs, attach them to the floor with carpet tape.  Make sure that you have a light switch at the top of the stairs and the bottom of the stairs. If you do not have them, ask someone to add them for you. What else can I do to help prevent falls?  Wear shoes that:  Do not have high heels.  Have rubber bottoms.  Are comfortable and fit you well.  Are closed at the toe. Do not wear sandals.  If you use a stepladder:  Make sure that it is fully opened. Do not climb a closed  stepladder.  Make sure that both sides of the stepladder are locked into place.  Ask someone to hold it for you, if possible.  Clearly mark and make sure that you can see:  Any grab bars or handrails.  First and last steps.  Where the edge of each step is.  Use tools that help you move around (mobility aids) if they are needed. These include:  Canes.  Walkers.  Scooters.  Crutches.  Turn on the lights when you go into a dark area. Replace any light bulbs as soon as they burn out.  Set up your furniture so you have a clear path. Avoid moving your furniture around.  If any of your floors are uneven, fix them.  If there are any pets around you, be aware of where they are.  Review your medicines with your doctor. Some medicines can make you feel dizzy. This can increase your chance of falling. Ask your doctor what other things that you can do to help prevent falls. This information is not intended to replace advice given to you by your health care provider. Make sure you discuss any questions you have with your health care provider. Document Released: 12/23/2008 Document Revised: 08/04/2015 Document Reviewed: 04/02/2014 Elsevier Interactive Patient Education  2017 [employer].

## 2020-01-01 NOTE — Progress Notes (Signed)
I connected with Eileen Stewart today by telephone and verified that I am speaking with the correct person using two identifiers. Location patient: home Location provider: work Persons participating in the virtual visit: Cassand[place], Drew LPN.   I discussed the limitations, risks, security and privacy concerns of performing an evaluation and management service by telephone and the availability of in person appointments. I also discussed with the patient that there may be a patient responsible charge related to this service. The patient expressed understanding and verbally consented to this telephonic visit.    Interactive audio and video telecommunications were attempted between this provider and patient, however failed, due to patient having technical difficulties OR patient did not have access to video capability.  We continued and completed visit with audio only.     Vital signs may be patient reported or missing.  Subjective:   Eileen Stewart is a 69 y.o. female who presents for Medicare Annual (Subsequent) preventive examination.  Review of Systems     Cardiac Risk Factors include: advanced age (>33men, >66 women);dyslipidemia;hypertension;sedentary lifestyle     Objective:    Today's Vitals   01/01/20 1513  Weight: 115 lb (52.2 kg)  Height: 5\' 1"  (1.549 m)   Body mass index is 21.73 kg/m.  Advanced Directives 01/01/2020 03/08/2019 12/29/2018 07/30/2017  Does Patient Have a Medical Advance Directive? Yes Yes Yes Yes  Type of 08/01/2017 of [place];Living will Healthcare Power of [place];Living will Living will;Healthcare Power of [place] Power of [place];Living will  Copy of Healthcare Power of Attorney in Chart? No - copy requested - No - copy requested -    Current Medications (verified) Outpatient Encounter Medications as of 01/01/2020  Medication Sig  . aspirin EC 81 MG tablet Take 1 tablet (81 mg total) by mouth  daily.  01/03/2020 buPROPion (WELLBUTRIN XL) 300 MG 24 hr tablet TK 1 T PO D  . Calcium Carbonate-Vitamin D (CALCIUM 500/D PO) Take by mouth.  Marland Kitchen lisinopril-hydrochlorothiazide (ZESTORETIC) 20-12.5 MG tablet Take 1 tablet by mouth daily.  Marland Kitchen oxybutynin (DITROPAN-XL) 10 MG 24 hr tablet Take 1 tablet (10 mg total) by mouth daily.  Marland Kitchen venlafaxine XR (EFFEXOR-XR) 75 MG 24 hr capsule TAKE 1 CAPSULE(75 MG) BY MOUTH DAILY WITH BREAKFAST   No facility-administered encounter medications on file as of 01/01/2020.    Allergies (verified) Patient has no known allergies.   History: Past Medical History:  Diagnosis Date  . Acute pyelonephritis   . Alcohol abuse    Alcohol, remission since 2013  . Anemia   . Asymptomatic varicose veins, unspecified laterality   . Depression   . Essential tremor   . Hyperlipidemia   . Hypertension   . Liver mass   . Vitamin D deficiency    Past Surgical History:  Procedure Laterality Date  . SPLENECTOMY     Family History  Problem Relation Age of Onset  . Hyperlipidemia Mother   . Hypertension Mother   . Alcohol abuse Father   . Depression Sister   . Anxiety disorder Sister   . Parkinson's disease Cousin    Social History   Socioeconomic History  . Marital status: Divorced    Spouse name: Not on file  . Number of children: Not on file  . Years of education: Not on file  . Highest education level: Associate degree: academic program  Occupational History  . Occupation: retired  Tobacco Use  . Smoking status: Never Smoker  . Smokeless tobacco: Never Used  Vaping Use  . Vaping Use: Never used  Substance and Sexual Activity  . Alcohol use: Yes    Comment: 3 beers a month on average   . Drug use: No  . Sexual activity: Not on file  Other Topics Concern  . Not on file  Social History Narrative  . Not on file   Social Determinants of Health   Financial Resource Strain: Low Risk   . Difficulty of Paying Living Expenses: Not hard at all  Food  Insecurity: No Food Insecurity  . Worried About Programme researcher, broadcasting/film/videounning Out of Food in the Last Year: Never true  . Ran Out of Food in the Last Year: Never true  Transportation Needs: No Transportation Needs  . Lack of Transportation (Medical): No  . Lack of Transportation (Non-Medical): No  Physical Activity: Inactive  . Days of Exercise per Week: 0 days  . Minutes of Exercise per Session: 0 min  Stress: No Stress Concern Present  . Feeling of Stress : Not at all  Social Connections:   . Frequency of Communication with Friends and Family: Not on file  . Frequency of Social Gatherings with Friends and Family: Not on file  . Attends Religious Services: Not on file  . Active Member of Clubs or Organizations: Not on file  . Attends BankerClub or Organization Meetings: Not on file  . Marital Status: Not on file    Tobacco Counseling Counseling given: Not Answered   Clinical Intake:  Pre-visit preparation completed: Yes  Pain : No/denies pain     Nutritional Status: BMI of 19-24  Normal Nutritional Risks: None Diabetes: No  How often do you need to have someone help you when you read instructions, pamphlets, or other written materials from your doctor or pharmacy?: 1 - Never What is the last grade level you completed in school?: associate degree  Diabetic? no  Interpreter Needed?: No  Information entered by :: NAllen LPN   Activities of Daily Living In your present state of health, do you have any difficulty performing the following activities: 01/01/2020 03/27/2019  Hearing? N Y  Vision? N N  Difficulty concentrating or making decisions? N N  Comment forgets names -  Walking or climbing stairs? N N  Dressing or bathing? N N  Doing errands, shopping? N N  Preparing Food and eating ? N -  Using the Toilet? N -  In the past six months, have you accidently leaked urine? Y -  Comment takes oxybutyin -  Do you have problems with loss of bowel control? N -  Managing your Medications? N -   Managing your Finances? N -  Housekeeping or managing your Housekeeping? N -  Some recent data might be hidden    Patient Care Team: Particia NearingLane, Rachel Elizabeth, PA-C as PCP - General (Family Medicine)  Indicate any recent Medical Services you may have received from other than [facility] providers in the past year (date may be approximate).     Assessment:   This is a routine wellness examination for Eileen Stewart.  Hearing/Vision screen  Hearing Screening   125Hz  250Hz  500Hz  1000Hz  2000Hz  3000Hz  4000Hz  6000Hz  8000Hz   Right ear:           Left ear:           Vision Screening Comments: No regular eye exams, [facility]  Dietary issues and exercise activities discussed: Current Exercise Habits: The patient does not participate in regular exercise at present  Goals    . Patient  Stated     01/01/2020, no goals      Depression Screen PHQ 2/9 Scores 01/01/2020 03/27/2019 12/29/2018 08/01/2018 05/21/2018 12/23/2017 11/20/2017  PHQ - 2 Score 0 0 0 6 2 1  0  PHQ- 9 Score 0 1 - 15 4 2 1     Fall Risk Fall Risk  01/01/2020 03/27/2019 12/29/2018 08/01/2018 05/21/2018  Falls in the past year? 0 0 0 0 0  Number falls in past yr: - 0 0 0 -  Injury with Fall? - 0 0 0 -  Risk for fall due to : Medication side effect - - - -  Follow up Falls evaluation completed;Education provided;Falls prevention discussed - - - -    Any stairs in or around the home? Yes  If so, are there any without handrails? No  Home free of loose throw rugs in walkways, pet beds, electrical cords, etc? Yes  Adequate lighting in your home to reduce risk of falls? Yes   ASSISTIVE DEVICES UTILIZED TO PREVENT FALLS:  Life alert? No  Use of a cane, walker or w/c? No  Grab bars in the bathroom? No  Shower chair or bench in shower? Yes  Elevated toilet seat or a handicapped toilet? No   TIMED UP AND GO:  Was the test performed? No . .   Cognitive Function:     6CIT Screen 01/01/2020 12/23/2017  What Year? 0 points 4 points   What month? 0 points 3 points  What time? 0 points 3 points  Count back from 20 0 points 4 points  Months in reverse 0 points 4 points  Repeat phrase 0 points 8 points  Total Score 0 26    Immunizations Immunization History  Administered Date(s) Administered  . Influenza, High Dose Seasonal PF 11/20/2017  . Influenza,inj,Quad PF,6+ Mos 11/14/2016  . Influenza-Unspecified 11/14/2016  . Meningococcal Conjugate 10/06/2015  . PFIZER SARS-COV-2 Vaccination 05/08/2019, 06/02/2019  . PPD Test 12/24/2012  . Pneumococcal Conjugate-13 11/14/2016  . Pneumococcal Polysaccharide-23 10/08/2014  . Td 06/28/2014  . Tdap 03/08/2019    TDAP status: Up to date Flu Vaccine status: Declined, Education has been provided regarding the importance of this vaccine but patient still declined. Advised may receive this vaccine at local pharmacy or Health Dept. Aware to provide a copy of the vaccination record if obtained from local pharmacy or Health Dept. Verbalized acceptance and understanding. Pneumococcal vaccine status: Up to date Covid-19 vaccine status: Completed vaccines  Qualifies for Shingles Vaccine? Yes   Zostavax completed No   Shingrix Completed?: No.    Education has been provided regarding the importance of this vaccine. Patient has been advised to call insurance company to determine out of pocket expense if they have not yet received this vaccine. Advised may also receive vaccine at local pharmacy or Health Dept. Verbalized acceptance and understanding.  Screening Tests Health Maintenance  Topic Date Due  . MAMMOGRAM  01/03/2018  . PNA vac Low Risk Adult (2 of 2 - PPSV23) 10/08/2019  . INFLUENZA VACCINE  10/11/2019  . DEXA SCAN  03/26/2020 (Originally 10/10/2017)  . COLONOSCOPY  03/26/2020 (Originally 10/11/2002)  . TETANUS/TDAP  03/07/2029  . COVID-19 Vaccine  Completed  . Hepatitis C Screening  Completed    Health Maintenance  Health Maintenance Due  Topic Date Due  . MAMMOGRAM   01/03/2018  . PNA vac Low Risk Adult (2 of 2 - PPSV23) 10/08/2019  . INFLUENZA VACCINE  10/11/2019    Colorectal cancer screening: decline Mammogram status: decline  Bone Density status: decline  Lung Cancer Screening: (Low Dose CT Chest recommended if Age 43-80 years, 30 pack-year currently smoking OR have quit w/in 15years.) does not qualify.   Lung Cancer Screening Referral: no  Additional Screening:  Hepatitis C Screening: does qualify; Completed 10/05/2014  Vision Screening: Recommended annual ophthalmology exams for early detection of glaucoma and other disorders of the eye. Is the patient up to date with their annual eye exam?  No  Who is the provider or what is the name of the office in which the patient attends annual eye exams? [facility] If pt is not established with a provider, would they like to be referred to a provider to establish care? No .   Dental Screening: Recommended annual dental exams for proper oral hygiene  Community Resource Referral / Chronic Care Management: CRR required this visit?  No   CCM required this visit?  No      Plan:     I have personally reviewed and noted the following in the patient's chart:   . Medical and social history . Use of alcohol, tobacco or illicit drugs  . Current medications and supplements . Functional ability and status . Nutritional status . Physical activity . Advanced directives . List of other physicians . Hospitalizations, surgeries, and ER visits in previous 12 months . Vitals . Screenings to include cognitive, depression, and falls . Referrals and appointments  In addition, I have reviewed and discussed with patient certain preventive protocols, quality metrics, and best practice recommendations. A written personalized care plan for preventive services as well as general preventive health recommendations were provided to patient.     Barb Merino, LPN   [id number]   Nurse Notes:

## 2020-01-04 ENCOUNTER — Ambulatory Visit: Payer: Medicare Other | Admitting: Urology

## 2020-01-04 ENCOUNTER — Other Ambulatory Visit: Payer: Self-pay

## 2020-01-04 DIAGNOSIS — N362 Urethral caruncle: Secondary | ICD-10-CM

## 2020-01-04 DIAGNOSIS — R3129 Other microscopic hematuria: Secondary | ICD-10-CM

## 2020-01-04 DIAGNOSIS — N3281 Overactive bladder: Secondary | ICD-10-CM

## 2020-01-28 ENCOUNTER — Telehealth: Payer: Self-pay | Admitting: Urology

## 2020-01-28 NOTE — Telephone Encounter (Signed)
Please call Eileen Stewart and have her rescheduled her missed appointment from October for micro heme.

## 2020-01-31 NOTE — Progress Notes (Deleted)
02/01/2020 7:03 PM   Eileen Stewart [birth year] [id number]  Referring provider: Particia Nearing, PA-C [address] [place],  [place] [address]  No chief complaint on file.   HPI: Mrs. Date is a 69 year old female with an urethral caruncle, microscopic hematuria and post menopausal bleeding who presents today for follow up.  Urethral caruncle She states that she has been consistent with applying the vaginal estrogen cream Monday, Wednesday nights and Friday nights.  She has not noted any spotting in her underwear or any episodes of gross hematuria.  She states that she feels the area is getting smaller when she examines it in the mirror.  Microscopic hematuria (high risk) Non smoker.  Cystoscopy 01/02/2019 with Dr. Apolinar Stewart was NED.  Hematuria likely due urethral caruncle.  UA at PCP was positive for 3-10 RBC's on 03/27/2019 with negative urine culture.  She denies any gross hematuria.  Subsequent UAs have not demonstrated micro heme.  Post menopausal bleeding Underwent endometrial biopsy with Dr. Tiburcio Stewart 11/19/2018.  Findings for benign tissue.  No reports of brown spots on underwear today  OAB Patient continues the oxybutynin XL 10 mg daily.  She states that she has noted a worsening in her urinary symptoms over the last several weeks.  She states on one particular occasion, she went for a walk and when she returned to her car she states her incontinent pad was wet and so was her clothing.  She stated that she did not even realize that she had an episode of incontinence.  She is also noted an increase in her urinary urgency as well.  UA is yellow hazy, specific gravity 1.020, pH 7.50 trace blood, 0-5 squamous epithelial cells, 0-5 WBCs, 0-5 RBCs and a few bacteria.    PMH: Past Medical History:  Diagnosis Date  . Acute pyelonephritis   . Alcohol abuse    Alcohol, remission since 2013  . Anemia   . Asymptomatic varicose veins, unspecified laterality   . Depression   .  Essential tremor   . Hyperlipidemia   . Hypertension   . Liver mass   . Vitamin D deficiency     Surgical History: Past Surgical History:  Procedure Laterality Date  . SPLENECTOMY      Home Medications:  Allergies as of 02/01/2020   No Known Allergies     Medication List       Accurate as of January 31, 2020  7:03 PM. If you have any questions, ask your nurse or doctor.        aspirin EC 81 MG tablet Take 1 tablet (81 mg total) by mouth daily.   buPROPion 300 MG 24 hr tablet Commonly known as: WELLBUTRIN XL TK 1 T PO D   CALCIUM 500/D PO Take by mouth.   lisinopril-hydrochlorothiazide 20-12.5 MG tablet Commonly known as: ZESTORETIC Take 1 tablet by mouth daily.   oxybutynin 10 MG 24 hr tablet Commonly known as: DITROPAN-XL Take 1 tablet (10 mg total) by mouth daily.   venlafaxine XR 75 MG 24 hr capsule Commonly known as: EFFEXOR-XR TAKE 1 CAPSULE(75 MG) BY MOUTH DAILY WITH BREAKFAST       Allergies: No Known Allergies  Family History: Family History  Problem Relation Age of Onset  . Hyperlipidemia Mother   . Hypertension Mother   . Alcohol abuse Father   . Depression Sister   . Anxiety disorder Sister   . Parkinson's disease Cousin     Social History:  reports that  she has never smoked. She has never used smokeless tobacco. She reports current alcohol use. She reports that she does not use drugs.  ROS: For pertinent review of systems please refer to history of present illness  Physical Exam: There were no vitals taken for this visit.  Constitutional:  Well nourished. Alert and oriented, No acute distress. HEENT: [place] AT, moist mucus membranes.  Trachea midline, no masses. Cardiovascular: No clubbing, cyanosis, or edema. Respiratory: Normal respiratory effort, no increased work of breathing. GI: Abdomen is soft, non tender, non distended, no abdominal masses. Liver and spleen not palpable.  No hernias appreciated.  Stool sample for occult  testing is not indicated.   GU: No CVA tenderness.  No bladder fullness or masses.  *** external genitalia, *** pubic hair distribution, no lesions.  Normal urethral meatus, no lesions, no prolapse, no discharge.   No urethral masses, tenderness and/or tenderness. No bladder fullness, tenderness or masses. *** vagina mucosa, *** estrogen effect, no discharge, no lesions, *** pelvic support, *** cystocele and *** rectocele noted.  No cervical motion tenderness.  Uterus is freely mobile and non-fixed.  No adnexal/parametria masses or tenderness noted.  Anus and perineum are without rashes or lesions.   ***  Skin: No rashes, bruises or suspicious lesions. Lymph: No cervical or inguinal adenopathy. Neurologic: Grossly intact, no focal deficits, moving all 4 extremities. Psychiatric: Normal mood and affect.   Laboratory Data: Lab Results  Component Value Date   WBC 4.7 03/27/2019   HGB 14.9 03/27/2019   HCT 44.8 03/27/2019   MCV 88 03/27/2019   PLT 340 03/27/2019    Lab Results  Component Value Date   CREATININE 0.78 03/27/2019    Lab Results  Component Value Date   TSH 1.610 03/27/2019       Component Value Date/Time   CHOL 197 03/27/2019 1117   HDL 76 03/27/2019 1117   LDLCALC 113 (H) 03/27/2019 1117    Lab Results  Component Value Date   AST 23 03/27/2019   Lab Results  Component Value Date   ALT 18 03/27/2019    Urinalysis *** I have reviewed the labs.   Pertinent Imaging: ***    Assessment & Plan:    1. Urethral caruncle She will continue applying the vaginal estrogen cream on Monday, Wednesday and Friday It is cost prohibitive for her, so we will try to provide samples for her as it can take several months for the physiologic changes to take place within the vagina She will follow-up in 6 months for exam  2. Microscopic hematuria Likely due to urethral caruncle as microscopic hematuria has not been identified after treating the caruncle with the vaginal  estrogen cream UA is negative today No reports of gross hematuria She will return in 6 months for repeat UA and report any gross hematuria in the interim  3. Post menopausal bleeding Endometrial biopsy negative in 11/2018  4. OAB Not at goal with oxybuytnin XL 10 mg daily  We discussed trying samples of another overactive bladder agent, Myrbetriq, but she inquired if there was higher dose of oxybutynin XL available I stated that there was and since she has just filled her oxybutynin XL 10 mg prescription, she will take 2 of the oxybutynin XL 10 mg daily to see if this improves her urinary symptoms If not, she will follow-up sooner than the scheduled 6 months    No follow-ups on file.  These notes generated with voice recognition software. I apologize for typographical  errors.  Zara Council, PA-C  [facility] [address]  [place], [place] [address] [phone]

## 2020-02-01 ENCOUNTER — Ambulatory Visit: Payer: Medicare Other | Admitting: Urology

## 2020-02-01 ENCOUNTER — Other Ambulatory Visit: Payer: Self-pay

## 2020-02-01 DIAGNOSIS — N3281 Overactive bladder: Secondary | ICD-10-CM

## 2020-02-01 DIAGNOSIS — R3129 Other microscopic hematuria: Secondary | ICD-10-CM

## 2020-02-01 DIAGNOSIS — N362 Urethral caruncle: Secondary | ICD-10-CM

## 2020-02-07 NOTE — Progress Notes (Signed)
02/08/2020 11:39 AM   Eileen Stewart [birth year] [id number]  Referring provider: Particia Nearing, PA-C [address] [place],  [place] [address]  Chief Complaint  Patient presents with  . Follow-up    follow-up    HPI: [name] is a 69 year old female with an urethral caruncle, microscopic hematuria and post menopausal bleeding who presents today for follow up.  Urethral caruncle She stopped the vaginal estrogen cream a long time ago.  Microscopic hematuria (high risk) Non smoker.  Cystoscopy 01/02/2019 with Dr. Apolinar Junes was NED.  Hematuria likely due urethral caruncle.  UA at PCP was positive for 3-10 RBC's on 03/27/2019 with negative urine culture.  She denies any gross hematuria.  Subsequent UAs have not demonstrated micro heme.  Today's UA positive for 6-10 RBC's.  She has also noticed red streaks in her pads.    Post menopausal bleeding Underwent endometrial biopsy with Dr. Tiburcio Pea 11/19/2018.  Findings for benign tissue.    OAB Patient continues the oxybutynin XL 10 mg daily.  The patient is  experiencing urgency x 0-3, frequency x 4-7, not restricting fluids to avoid visits to the restroom, not engaging in toilet mapping, incontinence x 0-3 and nocturia x 0-3.   Her BP is 156/77.   Her PVR is 0 mL   She has episodes of incontinence without awareness.  She is wearing incontinence pads daily.      PMH: Past Medical History:  Diagnosis Date  . Acute pyelonephritis   . Alcohol abuse    Alcohol, remission since 2013  . Anemia   . Asymptomatic varicose veins, unspecified laterality   . Depression   . Essential tremor   . Hyperlipidemia   . Hypertension   . Liver mass   . Vitamin D deficiency     Surgical History: Past Surgical History:  Procedure Laterality Date  . SPLENECTOMY      Home Medications:  Allergies as of 02/08/2020   No Known Allergies     Medication List       Accurate as of February 08, 2020 11:39 AM. If you have any questions,  ask your nurse or doctor.        aspirin EC 81 MG tablet Take 1 tablet (81 mg total) by mouth daily.   buPROPion 300 MG 24 hr tablet Commonly known as: WELLBUTRIN XL TK 1 T PO D   CALCIUM 500/D PO Take by mouth.   lisinopril-hydrochlorothiazide 20-12.5 MG tablet Commonly known as: ZESTORETIC Take 1 tablet by mouth daily.   oxybutynin 10 MG 24 hr tablet Commonly known as: DITROPAN-XL Take 1 tablet (10 mg total) by mouth daily.   venlafaxine XR 75 MG 24 hr capsule Commonly known as: EFFEXOR-XR TAKE 1 CAPSULE(75 MG) BY MOUTH DAILY WITH BREAKFAST       Allergies: No Known Allergies  Family History: Family History  Problem Relation Age of Onset  . Hyperlipidemia Mother   . Hypertension Mother   . Alcohol abuse Father   . Depression Sister   . Anxiety disorder Sister   . Parkinson's disease Cousin     Social History:  reports that she has never smoked. She has never used smokeless tobacco. She reports current alcohol use. She reports that she does not use drugs.  ROS: For pertinent review of systems please refer to history of present illness  Physical Exam: BP (!) 156/77   Pulse 93   Ht 5\' 1"  (1.549 m)   Wt 114 lb (51.7  kg)   BMI 21.54 kg/m   Constitutional:  Well nourished. Alert and oriented, No acute distress. HEENT: [place] AT, mask in place.  Trachea midline Cardiovascular: No clubbing, cyanosis, or edema. Respiratory: Normal respiratory effort, no increased work of breathing. GU: No CVA tenderness.  No bladder fullness or masses.  Atrophic external genitalia, normal pubic hair distribution, no lesions.  Beefy red urethral caruncle noted.  No urethral masses, tenderness and/or tenderness. No bladder fullness, tenderness or masses. Pale vagina mucosa, poor estrogen effect, no discharge, no lesions, fair pelvic support, grade I cystocele and norectocele noted.  No cervical motion tenderness.  Uterus is freely mobile and non-fixed.  No adnexal/parametria masses or  tenderness noted.  Anus and perineum are without rashes or lesions.    Neurologic: Grossly intact, no focal deficits, moving all 4 extremities. Psychiatric: Normal mood and affect.   Laboratory Data: Lab Results  Component Value Date   WBC 4.7 03/27/2019   HGB 14.9 03/27/2019   HCT 44.8 03/27/2019   MCV 88 03/27/2019   PLT 340 03/27/2019    Lab Results  Component Value Date   CREATININE 0.78 03/27/2019    Lab Results  Component Value Date   TSH 1.610 03/27/2019       Component Value Date/Time   CHOL 197 03/27/2019 1117   HDL 76 03/27/2019 1117   LDLCALC 113 (H) 03/27/2019 1117    Lab Results  Component Value Date   AST 23 03/27/2019   Lab Results  Component Value Date   ALT 18 03/27/2019    Urinalysis Component     Latest Ref Rng & Units 02/08/2020  Color, Urine     YELLOW YELLOW  Appearance     CLEAR CLEAR  Specific Gravity, Urine     1.005 - 1.030 1.020  pH     5.0 - 8.0 7.0  Glucose, UA     NEGATIVE mg/dL NEGATIVE  Hgb urine dipstick     NEGATIVE TRACE (A)  Bilirubin Urine     NEGATIVE NEGATIVE  Ketones, ur     NEGATIVE mg/dL NEGATIVE  Protein     NEGATIVE mg/dL NEGATIVE  Nitrite     NEGATIVE NEGATIVE  Leukocytes,Ua     NEGATIVE NEGATIVE  Squamous Epithelial / LPF     0 - 5 0-5  WBC, UA     0 - 5 WBC/hpf 0-5  RBC / HPF     0 - 5 RBC/hpf 6-10  Bacteria, UA     NONE SEEN FEW (A)  Amorphous Crystal      PRESENT   I have reviewed the labs.   Pertinent Imaging: Results for EVANN, KOELZER (MRN [id number]) as of 02/08/2020 11:33  Ref. Range 02/08/2020 11:09  Scan Result Unknown 0 ml    Assessment & Plan:    1. Urethral caruncle She will restart applying the vaginal estrogen cream on Monday, Wednesday and Friday  2. Microscopic hematuria Likely due to urethral caruncle, but we did have a discussion regarding other causes of micro heme, such as kidney cancer.  As she is not wanting to apply the cream and the caruncle is not  bothersome, we will obtain a CT urogram to complete the hematuria work up.  If hematuria work up is negative, we can be more confident that the hematuria is the result of the urethral caruncle   3. Post menopausal bleeding Endometrial biopsy negative in 11/2018  4. OAB Still not at goal with the oxybutynin,but she is satisfied  Return in about 3 weeks (around 02/29/2020) for CT urogram report .  These notes generated with voice recognition software. I apologize for typographical errors.  Michiel Cowboy, PA-C  [facility] [address]  [address] [place], [place] [address] [phone]  I spent 25 minutes on the day of the encounter to include pre-visit record review, face-to-face time with the patient, and post-visit ordering of tests.

## 2020-02-08 ENCOUNTER — Other Ambulatory Visit: Payer: Self-pay

## 2020-02-08 ENCOUNTER — Other Ambulatory Visit
Admission: RE | Admit: 2020-02-08 | Discharge: 2020-02-08 | Disposition: A | Discharge status: Home / Self Care | Payer: Medicare Other | Attending: Urology | Admitting: Urology

## 2020-02-08 ENCOUNTER — Encounter: Payer: Self-pay | Admitting: Urology

## 2020-02-08 ENCOUNTER — Ambulatory Visit: Payer: Medicare Other | Admitting: Urology

## 2020-02-08 VITALS — BP 156/77 | HR 93 | Ht 61.0 in | Wt 114.0 lb

## 2020-02-08 DIAGNOSIS — R3129 Other microscopic hematuria: Secondary | ICD-10-CM | POA: Diagnosis present

## 2020-02-08 DIAGNOSIS — R319 Hematuria, unspecified: Secondary | ICD-10-CM | POA: Diagnosis not present

## 2020-02-08 DIAGNOSIS — N362 Urethral caruncle: Secondary | ICD-10-CM

## 2020-02-08 DIAGNOSIS — R310 Gross hematuria: Secondary | ICD-10-CM

## 2020-02-08 DIAGNOSIS — N3281 Overactive bladder: Secondary | ICD-10-CM

## 2020-02-08 LAB — URINALYSIS, COMPLETE (UACMP) WITH MICROSCOPIC
Bilirubin Urine: NEGATIVE
Glucose, UA: NEGATIVE mg/dL
Ketones, ur: NEGATIVE mg/dL
Leukocytes,Ua: NEGATIVE
Nitrite: NEGATIVE
Protein, ur: NEGATIVE mg/dL
Specific Gravity, Urine: 1.02 (ref 1.005–1.030)
pH: 7 (ref 5.0–8.0)

## 2020-02-08 LAB — BLADDER SCAN AMB NON-IMAGING: Scan Result: 0

## 2020-02-08 NOTE — Patient Instructions (Signed)
Please restart the vaginal estrogen cream by applying it every night for two weeks and then on Monday, Wednesday and Friday nights.  The CT department will call and schedule you for the CT scan to be completed prior to your return appointment

## 2020-02-08 NOTE — Addendum Note (Signed)
Addended by: Michiel Cowboy A on: 02/08/2020 11:46 AM   Modules accepted: Orders

## 2020-02-15 ENCOUNTER — Other Ambulatory Visit: Payer: Self-pay | Admitting: Family Medicine

## 2020-02-15 NOTE — Telephone Encounter (Signed)
Requested medications are due for refill today yes  Requested medications are on the active medication list yes  Last refill 8/20  Last visit Seen in June for HA, Physical Jan 2021  Future visit scheduled Oct 2022  Notes to clinic After Jan appt where Welbutrin was addressed asked to come back in 6 months. Was seen in June but just for HA, now next appt not until Oct 2022, please assess.

## 2020-02-16 NOTE — Telephone Encounter (Signed)
Previous RL patient. See message from [facility] below.

## 2020-02-16 NOTE — Telephone Encounter (Signed)
Lvm put on recall list to call once provider schedule it out.

## 2020-02-22 ENCOUNTER — Other Ambulatory Visit: Payer: Self-pay | Admitting: Nurse Practitioner

## 2020-02-22 ENCOUNTER — Other Ambulatory Visit: Payer: Self-pay | Admitting: Family Medicine

## 2020-02-22 MED ORDER — LISINOPRIL-HYDROCHLOROTHIAZIDE 20-12.5 MG PO TABS: 1.0000 | ORAL_TABLET | Freq: Every day | ORAL | 0 refills | Status: DC

## 2020-02-22 NOTE — Telephone Encounter (Signed)
Patient requesting lisinopril-hydrochlorothiazide (ZESTORETIC) 20-12.5 MG tablet, informed please allow 48 to 72 hour turn around time.   [id number] D[place]atha Martin, [place] - [address] AT [facility] [address] Phone:  [phone]  Fax:  [phone]

## 2020-02-26 ENCOUNTER — Other Ambulatory Visit: Payer: Self-pay

## 2020-02-26 ENCOUNTER — Ambulatory Visit
Admission: RE | Admit: 2020-02-26 | Discharge: 2020-02-26 | Disposition: A | Discharge status: Home / Self Care | Payer: Medicare Other | Source: Ambulatory Visit | Attending: Urology | Admitting: Urology

## 2020-02-26 DIAGNOSIS — R310 Gross hematuria: Secondary | ICD-10-CM

## 2020-02-26 LAB — POCT I-STAT CREATININE: Creatinine, Ser: 0.8 mg/dL (ref 0.44–1.00)

## 2020-02-26 MED ORDER — IOHEXOL 300 MG/ML  SOLN
100.0000 mL | Freq: Once | INTRAMUSCULAR | Status: AC | PRN
  Administered 2020-02-26: 100 mL via INTRAVENOUS

## 2020-02-28 NOTE — Progress Notes (Signed)
02/29/2020 3:08 PM   [name] 1950/12/29 [id number]  Referring provider: Particia Nearing, PA-C [address] [place],  [place] [address]  Chief Complaint  Patient presents with  . Follow-up    Discuss CT report    HPI: Eileen Stewart is a 69 year old female with an urethral caruncle, high risk hematuria and post menopausal bleeding who presents today to discuss CT urogram report.   Urethral caruncle She applied the cream about every night these last two weeks.    Microscopic hematuria (high risk) Non smoker.  Cystoscopy 01/02/2019 with Dr. Apolinar Junes was NED.  Hematuria likely due urethral caruncle.  CT Urogram 02/2020- NED.    Post menopausal bleeding Underwent endometrial biopsy with Dr. Tiburcio Pea 11/19/2018.  Findings for benign tissue.    PMH: Past Medical History:  Diagnosis Date  . Acute pyelonephritis   . Alcohol abuse    Alcohol, remission since 2013  . Anemia   . Asymptomatic varicose veins, unspecified laterality   . Depression   . Essential tremor   . Hyperlipidemia   . Hypertension   . Liver mass   . Vitamin D deficiency     Surgical History: Past Surgical History:  Procedure Laterality Date  . SPLENECTOMY      Home Medications:  Allergies as of 02/29/2020   No Known Allergies     Medication List       Accurate as of February 29, 2020  3:08 PM. If you have any questions, ask your nurse or doctor.        STOP taking these medications   CALCIUM 500/D PO Stopped by: Michiel Cowboy, PA-C     TAKE these medications   aspirin EC 81 MG tablet Take 1 tablet (81 mg total) by mouth daily.   buPROPion 300 MG 24 hr tablet Commonly known as: WELLBUTRIN XL TAKE 1 TABLET BY MOUTH EVERY DAY   lisinopril-hydrochlorothiazide 20-12.5 MG tablet Commonly known as: ZESTORETIC Take 1 tablet by mouth daily.   oxybutynin 10 MG 24 hr tablet Commonly known as: DITROPAN-XL Take 1 tablet (10 mg total) by mouth daily.   venlafaxine XR 75 MG 24 hr  capsule Commonly known as: EFFEXOR-XR TAKE 1 CAPSULE(75 MG) BY MOUTH DAILY WITH BREAKFAST       Allergies: No Known Allergies  Family History: Family History  Problem Relation Age of Onset  . Hyperlipidemia Mother   . Hypertension Mother   . Alcohol abuse Father   . Depression Sister   . Anxiety disorder Sister   . Parkinson's disease Cousin     Social History:  reports that she has never smoked. She has never used smokeless tobacco. She reports current alcohol use. She reports that she does not use drugs.  ROS: For pertinent review of systems please refer to history of present illness  Physical Exam: BP (!) 142/79   Pulse (!) 56   Ht 5\' 1"  (1.549 m)   Wt 115 lb (52.2 kg)   BMI 21.73 kg/m   Constitutional:  Well nourished. Alert and oriented, No acute distress. HEENT: [place] AT, mask in place.  Trachea midline, no masses. Cardiovascular: No clubbing, cyanosis, or edema. Respiratory: Normal respiratory effort, no increased work of breathing. Neurologic: Grossly intact, no focal deficits, moving all 4 extremities. Psychiatric: Normal mood and affect.   Laboratory Data: Lab Results  Component Value Date   WBC 4.7 03/27/2019   HGB 14.9 03/27/2019   HCT 44.8 03/27/2019   MCV 88 03/27/2019  PLT 340 03/27/2019    Lab Results  Component Value Date   CREATININE 0.80 02/26/2020    Lab Results  Component Value Date   TSH 1.610 03/27/2019       Component Value Date/Time   CHOL 197 03/27/2019 1117   HDL 76 03/27/2019 1117   LDLCALC 113 (H) 03/27/2019 1117    Lab Results  Component Value Date   AST 23 03/27/2019   Lab Results  Component Value Date   ALT 18 03/27/2019    Urinalysis Component     Latest Ref Rng & Units 02/08/2020  Color, Urine     YELLOW YELLOW  Appearance     CLEAR CLEAR  Specific Gravity, Urine     1.005 - 1.030 1.020  pH     5.0 - 8.0 7.0  Glucose, UA     NEGATIVE mg/dL NEGATIVE  Hgb urine dipstick     NEGATIVE TRACE (A)   Bilirubin Urine     NEGATIVE NEGATIVE  Ketones, ur     NEGATIVE mg/dL NEGATIVE  Protein     NEGATIVE mg/dL NEGATIVE  Nitrite     NEGATIVE NEGATIVE  Leukocytes,Ua     NEGATIVE NEGATIVE  Squamous Epithelial / LPF     0 - 5 0-5  WBC, UA     0 - 5 WBC/hpf 0-5  RBC / HPF     0 - 5 RBC/hpf 6-10  Bacteria, UA     NONE SEEN FEW (A)  Amorphous Crystal      PRESENT   I have reviewed the labs.   Pertinent Imaging: CLINICAL DATA:  Gross and microscopic hematuria.  EXAM: CT ABDOMEN AND PELVIS WITHOUT AND WITH CONTRAST  TECHNIQUE: Multidetector CT imaging of the abdomen and pelvis was performed following the standard protocol before and following the bolus administration of intravenous contrast.  CONTRAST:  OMNIPAQUE IOHEXOL 300 MG/ML  SOLN  COMPARISON:  None.  FINDINGS: Lower chest: Normal heart size. Lung bases are clear. No pleural effusion. Small amount of enhancing nodularity along the left hemidiaphragm (image 57; series 10 and medial right hemithorax (image 58; series 10). Findings may potentially represent pleural based splenosis.  Hepatobiliary: Liver is normal in size and contour. Multiple subcentimeter too small to characterize low-attenuation lesions are demonstrated within the left and right hepatic lobes. Additionally within the right hepatic lobe there is a subcapsular 1.5 x 1.3 cm peripherally enhancing nodules compatible with hemangioma.  Pancreas: Unremarkable  Spleen: Suspect multiple splenules within the left upper quadrant.  Adrenals/Urinary Tract: Normal adrenal glands. Noncontrast images demonstrate symmetric renal size. No nephroureterolithiasis. No hydronephrosis. Post-contrast enhanced images demonstrate symmetric renal enhancement. No suspicious enhancing renal masses. Delayed images demonstrate excreted contrast material in the bilateral renal collecting systems and ureters. No intraluminal filling defects are identified.  Urinary bladder is unremarkable.  Stomach/Bowel: No abnormal bowel wall thickening or evidence for bowel obstruction. No free fluid or free intraperitoneal air. Postsurgical changes involving the stomach.  Vascular/Lymphatic: Normal caliber abdominal aorta. Peripheral calcified atherosclerotic plaque. No retroperitoneal lymphadenopathy.  Reproductive: Unremarkable.  Other: None.  Musculoskeletal: Lower thoracic and lumbar spine degenerative changes. No aggressive or acute appearing osseous lesions.  IMPRESSION: 1. No nephroureterolithiasis. No hydronephrosis. No suspicious enhancing renal masses. No intraluminal filling defects identified within the opacified renal collecting systems and ureters.   Electronically Signed   By: Annia Belt M.D.   On: 02/28/2020 15:13 I have independently reviewed the films.  I did not appreciate any renal masses, hydronephrosis  or stones.  See HPI.   Assessment & Plan:    1. Urethral caruncle She will continue applying the vaginal estrogen cream on Monday, Wednesday and Friday  2. Gross hematuria Hematuria work up 02/2020 - negative She will follow up in three months  3. Post menopausal bleeding Endometrial biopsy negative in 11/2018  Return in about 3 months (around 05/29/2020) for exam .  These notes generated with voice recognition software. I apologize for typographical errors.  Michiel Cowboy, PA-C  [facility] [address]  [address] [place], [place] [address] [phone]

## 2020-02-29 ENCOUNTER — Encounter: Payer: Self-pay | Admitting: Urology

## 2020-02-29 ENCOUNTER — Other Ambulatory Visit: Payer: Self-pay

## 2020-02-29 ENCOUNTER — Ambulatory Visit: Payer: Medicare Other | Admitting: Urology

## 2020-02-29 ENCOUNTER — Encounter: Payer: Self-pay | Admitting: Nurse Practitioner

## 2020-02-29 ENCOUNTER — Ambulatory Visit (INDEPENDENT_AMBULATORY_CARE_PROVIDER_SITE_OTHER): Payer: Medicare Other | Admitting: Nurse Practitioner

## 2020-02-29 VITALS — BP 142/79 | HR 56 | Ht 61.0 in | Wt 115.0 lb

## 2020-02-29 VITALS — BP 136/70 | HR 65 | Temp 97.8°F | Ht 62.28 in | Wt 114.2 lb

## 2020-02-29 DIAGNOSIS — I10 Essential (primary) hypertension: Secondary | ICD-10-CM

## 2020-02-29 DIAGNOSIS — F339 Major depressive disorder, recurrent, unspecified: Secondary | ICD-10-CM

## 2020-02-29 DIAGNOSIS — R310 Gross hematuria: Secondary | ICD-10-CM

## 2020-02-29 DIAGNOSIS — R269 Unspecified abnormalities of gait and mobility: Secondary | ICD-10-CM | POA: Insufficient documentation

## 2020-02-29 DIAGNOSIS — E782 Mixed hyperlipidemia: Secondary | ICD-10-CM | POA: Diagnosis not present

## 2020-02-29 DIAGNOSIS — N3946 Mixed incontinence: Secondary | ICD-10-CM | POA: Diagnosis not present

## 2020-02-29 MED ORDER — OXYBUTYNIN CHLORIDE ER 10 MG PO TB24: 10.0000 mg | ORAL_TABLET | Freq: Every day | ORAL | 4 refills | Status: DC

## 2020-02-29 MED ORDER — BUPROPION HCL ER (XL) 300 MG PO TB24: ORAL_TABLET | ORAL | 4 refills | Status: DC

## 2020-02-29 MED ORDER — VENLAFAXINE HCL ER 75 MG PO CP24: ORAL_CAPSULE | ORAL | 4 refills | Status: DC

## 2020-02-29 MED ORDER — LISINOPRIL-HYDROCHLOROTHIAZIDE 20-12.5 MG PO TABS: 1.0000 | ORAL_TABLET | Freq: Every day | ORAL | 4 refills | Status: DC

## 2020-02-29 NOTE — Patient Instructions (Signed)

## 2020-02-29 NOTE — Assessment & Plan Note (Signed)
Chronic, ongoing with BP initially elevated today, but repeat closer to goal.  Continue current medication regimen and adjust as needed.  Refills sent in.  She refuses labs today.  Recommend she monitor BP at least a few mornings a week at home and document.  DASH diet at home. Return in 6 months.

## 2020-02-29 NOTE — Assessment & Plan Note (Signed)
Chronic, ongoing.  Continue current medication regimen and consider reduction or change to Myrbetriq in future due to age >58 and anticholinergic effects of Oxybutynin.  Return in 6 months.

## 2020-02-29 NOTE — Assessment & Plan Note (Signed)
Chronic, stable.  Denies SI/HI.  Continue current medication regimen and adjust as needed.  Refills sent in.  Return in 6 months. 

## 2020-02-29 NOTE — Progress Notes (Signed)
BP 136/70    Pulse 65    Temp 97.8 F (36.6 C) (Oral)    Ht 5' 2.28" (1.582 m)    Wt 114 lb 3.2 oz (51.8 kg)    SpO2 99%    BMI 20.70 kg/m    Subjective:    Patient ID: Eileen Stewart, female    DOB: 23-Oct-1950, 69 y.o.   MRN: [id number]  HPI: Eileen Stewart is a 69 y.o. female  Chief Complaint  Patient presents with   Medication Refill   Balance issues    Has had for the past few months, has fallen twice   HYPERTENSION/HLD Continues on Lisinopril-HCTZ + ASA, no current statin.  Takes Oxybutynin daily for mixed incontinence issue, has taken for years. Hypertension status: stable  Satisfied with current treatment? yes Duration of hypertension: chronic BP monitoring frequency:  not checking BP range:  BP medication side effects:  no Medication compliance: good compliance Aspirin: yes Recurrent headaches: no Visual changes: no Palpitations: no Dyspnea: no Chest pain: no Lower extremity edema: no Dizzy/lightheaded: no  The 10-year ASCVD risk score Denman George DC Jr., et al., 2013) is: 9.3%   Values used to calculate the score:     Age: 3 years     Sex: Female     Is Non-Hispanic African American: No     Diabetic: No     Tobacco smoker: No     Systolic Blood Pressure: 136 mmHg     Is BP treated: Yes     HDL Cholesterol: 76 mg/dL     Total Cholesterol: 197 mg/dL  DEPRESSION Continues on Wellbutrin and Effexor, at times comes off of this if mood improved. Mood status: stable Satisfied with current treatment?: yes Symptom severity: mild  Duration of current treatment : chronic Side effects: no Medication compliance: good compliance Psychotherapy/counseling: none Depressed mood: no Anxious mood: no Anhedonia: no Significant weight loss or gain: no Insomnia: none Fatigue: no Feelings of worthlessness or guilt: no Impaired concentration/indecisiveness: yes Suicidal ideations: no Hopelessness: no Crying spells: no Depression screen [facility] 2/9 02/29/2020 01/01/2020  03/27/2019 12/29/2018 08/01/2018  Decreased Interest 0 0 0 0 3  Down, Depressed, Hopeless 0 0 0 0 3  PHQ - 2 Score 0 0 0 0 6  Altered sleeping 0 0 0 - 3  Tired, decreased energy 0 0 1 - 3  Change in appetite 0 0 0 - 1  Feeling bad or failure about yourself  0 0 0 - 2  Trouble concentrating 0 0 0 - 0  Moving slowly or fidgety/restless 0 0 0 - 0  Suicidal thoughts 0 0 0 - 0  PHQ-9 Score 0 0 1 - 15  Difficult doing work/chores - Not difficult at all - - Extremely dIfficult  Some recent data might be hidden   GAD 7 : Generalized Anxiety Score 02/29/2020 03/27/2019 08/01/2018 05/21/2018  Nervous, Anxious, on Edge 0 2 3 0  Control/stop worrying 0 0 3 0  Worry too much - different things 0 0 3 1  Trouble relaxing 0 0 1 0  Restless 0 0 2 0  Easily annoyed or irritable 0 2 3 1   Afraid - awful might happen 0 0 0 0  Total GAD 7 Score 0 4 15 2   Anxiety Difficulty - Somewhat difficult Extremely difficult -    BALANCE ISSUES: Has had issues with this for the past month.  Even with simple moves, is struggling with footing.  Has had two falls over past few  weeks, had bruising and one with a knot on head.  Denies dizziness, neuropathy pain, weakness, vertigo.  Reports feeling off kilter.  Last eye exam is overdue, about 2 years ago.  No difficulty with hearing.  No history of tick borne disease.  Reports decrease fluid intake recently with moving to different houses.  She does not wish to get labs today, would like to try increasing hydration as has not been drinking her usual amount water.  Fall Risk  02/29/2020 02/29/2020 01/01/2020 03/27/2019 12/29/2018  Falls in the past year? 1 1 0 0 0  Number falls in past yr: 1 1 - 0 0  Injury with Fall? 0 1 - 0 0  Risk for fall due to : Impaired balance/gait Impaired balance/gait Medication side effect - -  Follow up Falls prevention discussed Falls evaluation completed Falls evaluation completed;Education provided;Falls prevention discussed - -   Relevant past  medical, surgical, family and social history reviewed and updated as indicated. Interim medical history since our last visit reviewed. Allergies and medications reviewed and updated.  Review of Systems  Constitutional: Negative for activity change, appetite change, diaphoresis, fatigue and fever.  Respiratory: Negative for cough, chest tightness and shortness of breath.   Cardiovascular: Negative for chest pain, palpitations and leg swelling.  Gastrointestinal: Negative.   Endocrine: Negative for cold intolerance, heat intolerance, polydipsia, polyphagia and polyuria.  Neurological: Negative for dizziness, syncope, facial asymmetry, weakness, light-headedness, numbness and headaches.  Psychiatric/Behavioral: Negative.     Per HPI unless specifically indicated above     Objective:    BP 136/70    Pulse 65    Temp 97.8 F (36.6 C) (Oral)    Ht 5' 2.28" (1.582 m)    Wt 114 lb 3.2 oz (51.8 kg)    SpO2 99%    BMI 20.70 kg/m   Wt Readings from Last 3 Encounters:  02/29/20 114 lb 3.2 oz (51.8 kg)  02/08/20 114 lb (51.7 kg)  01/01/20 115 lb (52.2 kg)    Physical Exam Vitals and nursing note reviewed.  Constitutional:      General: She is awake. She is not in acute distress.    Appearance: She is well-developed and well-groomed. She is not ill-appearing.  HENT:     Head: Normocephalic.     Right Ear: Hearing normal.     Left Ear: Hearing normal.  Eyes:     General: Lids are normal.        Right eye: No discharge.        Left eye: No discharge.     Conjunctiva/sclera: Conjunctivae normal.     Pupils: Pupils are equal, round, and reactive to light.  Neck:     Thyroid: No thyromegaly.     Vascular: No carotid bruit.  Cardiovascular:     Rate and Rhythm: Normal rate and regular rhythm.     Heart sounds: Normal heart sounds. No murmur heard. No gallop.   Pulmonary:     Effort: Pulmonary effort is normal. No accessory muscle usage or respiratory distress.     Breath sounds: Normal  breath sounds.  Abdominal:     General: Bowel sounds are normal.     Palpations: Abdomen is soft. There is no hepatomegaly or splenomegaly.  Musculoskeletal:     Cervical back: Normal range of motion and neck supple.     Right lower leg: No edema.     Left lower leg: No edema.  Skin:    General: Skin is warm  and dry.  Neurological:     Mental Status: She is alert and oriented to person, place, and time.     Cranial Nerves: Cranial nerves are intact.     Sensory: Sensation is intact.     Motor: Motor function is intact.     Coordination: Coordination is intact. Romberg sign negative.     Gait: Gait is intact.     Deep Tendon Reflexes: Reflexes are normal and symmetric.     Reflex Scores:      Brachioradialis reflexes are 2+ on the right side and 2+ on the left side.      Patellar reflexes are 2+ on the right side and 2+ on the left side. Psychiatric:        Attention and Perception: Attention normal.        Mood and Affect: Mood normal.        Speech: Speech normal.        Behavior: Behavior normal. Behavior is cooperative.        Thought Content: Thought content normal.     Results for orders placed or performed during the hospital encounter of 02/26/20  I-STAT creatinine  Result Value Ref Range   Creatinine, Ser 0.80 0.44 - 1.00 mg/dL      Assessment & Plan:   Problem List Items Addressed This Visit      Cardiovascular and Mediastinum   Essential hypertension    Chronic, ongoing with BP initially elevated today, but repeat closer to goal.  Continue current medication regimen and adjust as needed.  Refills sent in.  She refuses labs today.  Recommend she monitor BP at least a few mornings a week at home and document.  DASH diet at home. Return in 6 months.       Relevant Medications   lisinopril-hydrochlorothiazide (ZESTORETIC) 20-12.5 MG tablet     Other   Hyperlipidemia    Chronic, ongoing.  No medications.  Refuses labs today.  Consider labs in future and statin  initiation, ASCVD 9.3%.      Relevant Medications   lisinopril-hydrochlorothiazide (ZESTORETIC) 20-12.5 MG tablet   Major depression, recurrent, chronic (HCC) - Primary    Chronic, stable.  Denies SI/HI.  Continue current medication regimen and adjust as needed.  Refills sent in.  Return in 6 months.      Relevant Medications   venlafaxine XR (EFFEXOR-XR) 75 MG 24 hr capsule   buPROPion (WELLBUTRIN XL) 300 MG 24 hr tablet   Mixed incontinence    Chronic, ongoing.  Continue current medication regimen and consider reduction or change to Myrbetriq in future due to age >50 and anticholinergic effects of Oxybutynin.  Return in 6 months.      Gait abnormality    Reports x 2 recent falls.  She refuses labs, imaging, or referrals today.  She would like to focus on increasing her hydration back to normal.  No red flags and normal neuro exam today.  Will have return in 8 weeks for follow-up and advised she return sooner if worsening.          Follow up plan: Return in about 8 weeks (around 04/25/2020) for Balance check.

## 2020-02-29 NOTE — Assessment & Plan Note (Addendum)
Chronic, ongoing.  No medications.  Refuses labs today.  Consider labs in future and statin initiation, ASCVD 9.3%.

## 2020-02-29 NOTE — Assessment & Plan Note (Signed)
Reports x 2 recent falls.  She refuses labs, imaging, or referrals today.  She would like to focus on increasing her hydration back to normal.  No red flags and normal neuro exam today.  Will have return in 8 weeks for follow-up and advised she return sooner if worsening.

## 2020-04-19 ENCOUNTER — Other Ambulatory Visit: Payer: Self-pay | Admitting: Family Medicine

## 2020-04-19 NOTE — Telephone Encounter (Signed)
Requested Prescriptions  Pending Prescriptions Disp Refills  . ASPIRIN LOW DOSE 81 MG EC tablet [Pharmacy Med Name: ASPIRIN 81MG  EC LOW DOSE  TABLETS] 90 tablet 2    Sig: TAKE 1 TABLET(81 MG) BY MOUTH DAILY     Analgesics:  NSAIDS - aspirin Passed - 04/19/2020  5:20 PM      Passed - Patient is not pregnant      Passed - Valid encounter within last 12 months    Recent Outpatient Visits          1 month ago Major depression, recurrent, chronic (HCC)   [facility] Cannady, Jolene T, NP   8 months ago Nonintractable episodic headache, unspecified headache type   [facility], NP   1 year ago Mixed hyperlipidemia   [facility] [place]   1 year ago Left foot pain   [facility] [place]   1 year ago Hematuria, unspecified type   [facility] [place], NP      Future Appointments            In 1 month McGowan, Dorie Rank, PA-C [facility] [place]   In 8 months  [place], [facility]

## 2020-05-03 ENCOUNTER — Telehealth: Payer: Self-pay

## 2020-05-03 NOTE — Telephone Encounter (Signed)
Called pt to schedule no answer left vm  Copied from CRM [id number]. Topic: Appointment Scheduling - Scheduling Inquiry for Clinic >> May 03, 2020  2:28 PM Leafy Ro wrote: Reason for CRM:  Pt is calling and would sch an appt with karen for painful spot on her nose that has been on her nose for less than a wk

## 2020-05-03 NOTE — Telephone Encounter (Signed)
PT called back to schedule an appt with Larae Grooms. Please CB.

## 2020-05-03 NOTE — Telephone Encounter (Signed)
Scheduled

## 2020-05-06 ENCOUNTER — Telehealth: Payer: Self-pay | Admitting: Nurse Practitioner

## 2020-05-06 NOTE — Telephone Encounter (Signed)
Patient requesting venlafaxine XR (EFFEXOR-XR) 75 MG 24 hr capsule    [id number] [place], [place] - [address] AT [facility] [address] Phone:  [phone]  Fax:  [phone]

## 2020-05-06 NOTE — Progress Notes (Signed)
BP 122/73   Pulse 77   Temp 98.6 F (37 C)   Wt 116 lb (52.6 kg)   SpO2 98%   BMI 21.92 kg/m    Subjective:    Patient ID: Eileen Stewart, female    DOB: [birth year], 70 y.o.   MRN: [id number]  HPI: Eileen Stewart is a 70 y.o. female  Chief Complaint  Patient presents with  . skin lesion     On nose   Patient seen today with complaints of a sore on her nose.  Patient states that it was there last week but has since resolved.  Patient denies any drainage, redness, swelling and pain in office today.    Patient states she is due for refills.  Explained that refills were sent in December.  She will check with the pharmacy.   DEPRESSION and ANXIETY Patient states she would like to increase her Effexor due to increased anxiety.  Patient feels like she is worrying more than normal. She is having difficulty concentrating on one task.  Patient denies SI/HI in office today.    Patient states she still has urinary frequency and leakage.  But it is improved with the medication.    Relevant past medical, surgical, family and social history reviewed and updated as indicated. Interim medical history since our last visit reviewed. Allergies and medications reviewed and updated.  Review of Systems  Genitourinary: Positive for urgency.       Urinary leakage  Skin:       Sore on nose  Psychiatric/Behavioral: Positive for decreased concentration. Negative for suicidal ideas. The patient is nervous/anxious.     Per HPI unless specifically indicated above     Objective:    BP 122/73   Pulse 77   Temp 98.6 F (37 C)   Wt 116 lb (52.6 kg)   SpO2 98%   BMI 21.92 kg/m   Wt Readings from Last 3 Encounters:  05/09/20 116 lb (52.6 kg)  02/29/20 115 lb (52.2 kg)  02/29/20 114 lb 3.2 oz (51.8 kg)    Physical Exam Vitals and nursing note reviewed.  Constitutional:      General: She is not in acute distress.    Appearance: Normal appearance. She is normal weight. She is not  ill-appearing, toxic-appearing or diaphoretic.  HENT:     Head: Normocephalic.     Right Ear: External ear normal.     Left Ear: External ear normal.     Nose: Nose normal.     Mouth/Throat:     Mouth: Mucous membranes are moist.     Pharynx: Oropharynx is clear.  Eyes:     General:        Right eye: No discharge.        Left eye: No discharge.     Extraocular Movements: Extraocular movements intact.     Conjunctiva/sclera: Conjunctivae normal.     Pupils: Pupils are equal, round, and reactive to light.  Cardiovascular:     Rate and Rhythm: Normal rate and regular rhythm.     Heart sounds: No murmur heard.   Pulmonary:     Effort: Pulmonary effort is normal. No respiratory distress.     Breath sounds: Normal breath sounds. No wheezing or rales.  Musculoskeletal:     Cervical back: Normal range of motion and neck supple.  Skin:    General: Skin is warm and dry.     Capillary Refill: Capillary refill takes less than 2 seconds.  Neurological:  General: No focal deficit present.     Mental Status: She is alert and oriented to person, place, and time. Mental status is at baseline.  Psychiatric:        Mood and Affect: Mood normal.        Behavior: Behavior normal.        Thought Content: Thought content normal.        Judgment: Judgment normal.     Results for orders placed or performed during the hospital encounter of 02/26/20  I-STAT creatinine  Result Value Ref Range   Creatinine, Ser 0.80 0.44 - 1.00 mg/dL      Assessment & Plan:   Problem List Items Addressed This Visit      Other   Major depression, recurrent, chronic (HCC) - Primary    Chronic.  Ongoing.  Patient would like to increase Effexor due to increased anxiety.  Return to clinic in 4 weeks for reevaluation.      Relevant Medications   venlafaxine XR (EFFEXOR XR) 150 MG 24 hr capsule   Mixed incontinence    Ongoing.  Changed medication from Oxybutinin to Myrbetric.  Side effects and benefits of  medication discussed with patient today.  Return in 4 weeks for reevaluation.      Relevant Medications   mirabegron ER (MYRBETRIQ) 25 MG TB24 tablet       Follow up plan: Return in about 1 month (around 06/06/2020) for Physical and Fasting labs and urine check .

## 2020-05-09 ENCOUNTER — Ambulatory Visit: Payer: Medicare (Managed Care) | Admitting: Nurse Practitioner

## 2020-05-09 ENCOUNTER — Other Ambulatory Visit: Payer: Self-pay

## 2020-05-09 ENCOUNTER — Encounter: Payer: Self-pay | Admitting: Nurse Practitioner

## 2020-05-09 VITALS — BP 122/73 | HR 77 | Temp 98.6°F | Wt 116.0 lb

## 2020-05-09 DIAGNOSIS — F339 Major depressive disorder, recurrent, unspecified: Secondary | ICD-10-CM

## 2020-05-09 DIAGNOSIS — N3946 Mixed incontinence: Secondary | ICD-10-CM | POA: Diagnosis not present

## 2020-05-09 MED ORDER — MIRABEGRON ER 25 MG PO TB24: 25.0000 mg | ORAL_TABLET | Freq: Every day | ORAL | 0 refills | Status: DC

## 2020-05-09 MED ORDER — VENLAFAXINE HCL ER 150 MG PO CP24: 150.0000 mg | ORAL_CAPSULE | Freq: Every day | ORAL | 1 refills | Status: DC

## 2020-05-09 NOTE — Assessment & Plan Note (Addendum)
Chronic.  Ongoing.  Patient would like to increase Effexor due to increased anxiety.  Return to clinic in 4 weeks for reevaluation.

## 2020-05-09 NOTE — Assessment & Plan Note (Signed)
Ongoing.  Changed medication from Oxybutinin to Myrbetric.  Side effects and benefits of medication discussed with patient today.  Return in 4 weeks for reevaluation.

## 2020-05-17 ENCOUNTER — Telehealth: Payer: Self-pay

## 2020-05-17 MED ORDER — VIBEGRON 75 MG PO TABS: 75.0000 mg | ORAL_TABLET | Freq: Every day | ORAL | 0 refills | Status: DC

## 2020-05-17 NOTE — Telephone Encounter (Signed)
I have sent an alternative medication in the same class as Myrbetric to the pharmacy for patient.

## 2020-05-17 NOTE — Telephone Encounter (Signed)
Please advise 

## 2020-05-17 NOTE — Telephone Encounter (Signed)
Called and left a message letting patient know.

## 2020-05-17 NOTE — Telephone Encounter (Signed)
Copied from CRM [id number]. Topic: General - Inquiry >> May 17, 2020 10:20 AM Daphine Deutscher D wrote: Reason for CRM: pt called saying the medication for her bladder that she was recently prescribed by Clydie Braun is too expensive for her.  She ants to know if there is something else she can take,  CB#  [phone] >> May 17, 2020 10:24 AM Daphine Deutscher D wrote: She also ask if this is the medication that was prescribed because the last one cause dry mouth.

## 2020-05-30 ENCOUNTER — Ambulatory Visit: Payer: Medicare Other | Admitting: Urology

## 2020-06-06 ENCOUNTER — Encounter: Payer: Self-pay | Admitting: Nurse Practitioner

## 2020-06-06 MED ORDER — MIRABEGRON ER 25 MG PO TB24: 25.0000 mg | ORAL_TABLET | Freq: Every day | ORAL | 0 refills | Status: DC

## 2020-06-06 NOTE — Telephone Encounter (Signed)
Called and left a detailed message for patient.  

## 2020-06-06 NOTE — Telephone Encounter (Addendum)
Pt called and stated that the cost for Vibegron 75 MG TABS Was too expensive as well for 30 tabs/Pt is still taking the old medication since last two prescribed has been too high in cost / please advise

## 2020-06-06 NOTE — Addendum Note (Signed)
Addended by: Larae Grooms on: 06/06/2020 10:33 AM   Modules accepted: Orders

## 2020-06-10 ENCOUNTER — Telehealth: Payer: Self-pay | Admitting: Nurse Practitioner

## 2020-06-10 NOTE — Telephone Encounter (Signed)
Pt is calling to let karen know there is no alternative to mirabegron and pt does not know what to do. Please advise

## 2020-06-10 NOTE — Telephone Encounter (Signed)
I can refer patient to urology for evaluation and treatment or we can restart the Oxybutinin.  I know that provided her little relief so I think Urology would be a better option.

## 2020-06-10 NOTE — Telephone Encounter (Signed)
Called patient to discuss options, no answer, left a voicemail for patient to return my call.

## 2020-06-13 MED ORDER — OXYBUTYNIN CHLORIDE ER 10 MG PO TB24: 10.0000 mg | ORAL_TABLET | Freq: Every day | ORAL | 1 refills | Status: DC

## 2020-06-13 NOTE — Telephone Encounter (Signed)
Medication sent to the pharmacy.

## 2020-06-13 NOTE — Telephone Encounter (Signed)
Patient would like to hold off on the referral, she would like to try the medication, please send a new prescription

## 2020-06-13 NOTE — Telephone Encounter (Signed)
Patient aware, verbalized understanding.

## 2020-06-29 ENCOUNTER — Ambulatory Visit (INDEPENDENT_AMBULATORY_CARE_PROVIDER_SITE_OTHER): Payer: Medicare Other | Admitting: Nurse Practitioner

## 2020-06-29 ENCOUNTER — Other Ambulatory Visit: Payer: Self-pay

## 2020-06-29 ENCOUNTER — Encounter: Payer: Self-pay | Admitting: Nurse Practitioner

## 2020-06-29 VITALS — BP 122/78 | HR 73 | Temp 97.0°F | Ht 61.38 in | Wt 117.4 lb

## 2020-06-29 DIAGNOSIS — Z0000 Encounter for general adult medical examination without abnormal findings: Secondary | ICD-10-CM

## 2020-06-29 DIAGNOSIS — E782 Mixed hyperlipidemia: Secondary | ICD-10-CM

## 2020-06-29 DIAGNOSIS — F339 Major depressive disorder, recurrent, unspecified: Secondary | ICD-10-CM

## 2020-06-29 DIAGNOSIS — Z23 Encounter for immunization: Secondary | ICD-10-CM

## 2020-06-29 DIAGNOSIS — N3946 Mixed incontinence: Secondary | ICD-10-CM | POA: Diagnosis not present

## 2020-06-29 DIAGNOSIS — I10 Essential (primary) hypertension: Secondary | ICD-10-CM | POA: Diagnosis not present

## 2020-06-29 LAB — URINALYSIS, ROUTINE W REFLEX MICROSCOPIC
Bilirubin, UA: NEGATIVE
Glucose, UA: NEGATIVE
Ketones, UA: NEGATIVE
Nitrite, UA: NEGATIVE
Protein,UA: NEGATIVE
RBC, UA: NEGATIVE
Specific Gravity, UA: 1.02 (ref 1.005–1.030)
Urobilinogen, Ur: 0.2 mg/dL (ref 0.2–1.0)
pH, UA: 7 (ref 5.0–7.5)

## 2020-06-29 LAB — MICROSCOPIC EXAMINATION: Bacteria, UA: NONE SEEN

## 2020-06-29 MED ORDER — ROSUVASTATIN CALCIUM 5 MG PO TABS: 5.0000 mg | ORAL_TABLET | Freq: Every day | ORAL | 1 refills | Status: DC

## 2020-06-29 NOTE — Assessment & Plan Note (Signed)
Chronic, ongoing.  Tried to change medication to Myrbetriq due to age and side effects of anticholinergic but it was not covered by insurance.  Will continue with Oxybutynin.   Return in 6 months.

## 2020-06-29 NOTE — Progress Notes (Signed)
BP 122/78   Pulse 73   Temp (!) 97 F (36.1 C)   Ht 5' 1.38" (1.559 m)   Wt 117 lb 6 oz (53.2 kg)   SpO2 99%   BMI 21.91 kg/m    Subjective:    Patient ID: Eileen Stewart, female    DOB: 1951/02/28, 70 y.o.   MRN: [id number]  HPI: [name] is a 70 y.o. female presenting on 06/29/2020 for comprehensive medical examination. Current medical complaints include:none  She currently lives with:  Menopausal Symptoms: no  HYPERTENSION / HYPERLIPIDEMIA Satisfied with current treatment? yes Duration of hypertension: years BP monitoring frequency: not checking BP range:  BP medication side effects: no Past BP meds: lisinopril-HCTZ Duration of hyperlipidemia: years Cholesterol medication side effects: no Cholesterol supplements: none Past cholesterol medications: atorvastain (lipitor) Medication compliance: excellent compliance Aspirin: yes Recent stressors: no Recurrent headaches: no Visual changes: no Palpitations: no Dyspnea: no Chest pain: no Lower extremity edema: no Dizzy/lightheaded: no  The 10-year ASCVD risk score Denman George DC Jr., et al., 2013) is: 7.5%   Values used to calculate the score:     Age: 35 years     Sex: Female     Is Non-Hispanic African American: No     Diabetic: No     Tobacco smoker: No     Systolic Blood Pressure: 122 mmHg     Is BP treated: Yes     HDL Cholesterol: 76 mg/dL     Total Cholesterol: 197 mg/dL  INCONTINENCE Patient states that her incontinence is a little bit better. She want back to the Oxybutinin due to the cost of Myrbetric. She state she still has dry mouth.  Patient reports urgency.    Depression Screen done today and results listed below:  Depression screen [facility] 2/9 06/29/2020 02/29/2020 01/01/2020 03/27/2019 12/29/2018  Decreased Interest 1 0 0 0 0  Down, Depressed, Hopeless 1 0 0 0 0  PHQ - 2 Score 2 0 0 0 0  Altered sleeping 1 0 0 0 -  Tired, decreased energy 0 0 0 1 -  Change in appetite 1 0 0 0 -  Feeling bad or  failure about yourself  0 0 0 0 -  Trouble concentrating 3 0 0 0 -  Moving slowly or fidgety/restless 0 0 0 0 -  Suicidal thoughts 0 0 0 0 -  PHQ-9 Score 7 0 0 1 -  Difficult doing work/chores Not difficult at all - Not difficult at all - -  Some recent data might be hidden    The patient does not have a history of falls. I did complete a risk assessment for falls. A plan of care for falls was documented.   Past Medical History:  Past Medical History:  Diagnosis Date  . Acute pyelonephritis   . Alcohol abuse    Alcohol, remission since 2013  . Anemia   . Asymptomatic varicose veins, unspecified laterality   . Depression   . Essential tremor   . Hyperlipidemia   . Hypertension   . Liver mass   . Vitamin D deficiency     Surgical History:  Past Surgical History:  Procedure Laterality Date  . SPLENECTOMY      Medications:  Current Outpatient Medications on File Prior to Visit  Medication Sig  . ASPIRIN LOW DOSE 81 MG EC tablet TAKE 1 TABLET(81 MG) BY MOUTH DAILY  . buPROPion (WELLBUTRIN XL) 300 MG 24 hr tablet TAKE 1 TABLET BY MOUTH EVERY DAY  .  lisinopril-hydrochlorothiazide (ZESTORETIC) 20-12.5 MG tablet Take 1 tablet by mouth daily.  Marland Kitchen. oxybutynin (DITROPAN-XL) 10 MG 24 hr tablet Take 1 tablet (10 mg total) by mouth at bedtime.  Marland Kitchen. venlafaxine XR (EFFEXOR XR) 150 MG 24 hr capsule Take 1 capsule (150 mg total) by mouth daily with breakfast.   No current facility-administered medications on file prior to visit.    Allergies:  No Known Allergies  Social History:  Social History   Socioeconomic History  . Marital status: Divorced    Spouse name: Not on file  . Number of children: Not on file  . Years of education: Not on file  . Highest education level: Associate degree: academic program  Occupational History  . Occupation: retired  Tobacco Use  . Smoking status: Never Smoker  . Smokeless tobacco: Never Used  Vaping Use  . Vaping Use: Never used  Substance  and Sexual Activity  . Alcohol use: Yes    Comment: 3 beers a month on average   . Drug use: No  . Sexual activity: Not on file  Other Topics Concern  . Not on file  Social History Narrative  . Not on file   Social Determinants of Health   Financial Resource Strain: Low Risk   . Difficulty of Paying Living Expenses: Not hard at all  Food Insecurity: No Food Insecurity  . Worried About Programme researcher, broadcasting/film/videounning Out of Food in the Last Year: Never true  . Ran Out of Food in the Last Year: Never true  Transportation Needs: No Transportation Needs  . Lack of Transportation (Medical): No  . Lack of Transportation (Non-Medical): No  Physical Activity: Inactive  . Days of Exercise per Week: 0 days  . Minutes of Exercise per Session: 0 min  Stress: No Stress Concern Present  . Feeling of Stress : Not at all  Social Connections: Not on file  Intimate Partner Violence: Not on file   Social History   Tobacco Use  Smoking Status Never Smoker  Smokeless Tobacco Never Used   Social History   Substance and Sexual Activity  Alcohol Use Yes   Comment: 3 beers a month on average     Family History:  Family History  Problem Relation Age of Onset  . Hyperlipidemia Mother   . Hypertension Mother   . Alcohol abuse Father   . Depression Sister   . Anxiety disorder Sister   . Parkinson's disease Cousin     Past medical history, surgical history, medications, allergies, family history and social history reviewed with patient today and changes made to appropriate areas of the chart.   Review of Systems  Eyes: Negative for blurred vision and double vision.  Respiratory: Negative for shortness of breath.   Cardiovascular: Negative for chest pain, palpitations and leg swelling.  Genitourinary: Positive for urgency.  Neurological: Negative for dizziness and headaches.   All other ROS negative except what is listed above and in the HPI.      Objective:    BP 122/78   Pulse 73   Temp (!) 97 F  (36.1 C)   Ht 5' 1.38" (1.559 m)   Wt 117 lb 6 oz (53.2 kg)   SpO2 99%   BMI 21.91 kg/m   Wt Readings from Last 3 Encounters:  06/29/20 117 lb 6 oz (53.2 kg)  05/09/20 116 lb (52.6 kg)  02/29/20 115 lb (52.2 kg)    Physical Exam Vitals and nursing note reviewed.  Constitutional:      General:  She is awake. She is not in acute distress.    Appearance: She is well-developed. She is not ill-appearing.  HENT:     Head: Normocephalic and atraumatic.     Right Ear: Hearing, tympanic membrane, ear canal and external ear normal. No drainage.     Left Ear: Hearing, tympanic membrane, ear canal and external ear normal. No drainage.     Nose: Nose normal.     Right Sinus: No maxillary sinus tenderness or frontal sinus tenderness.     Left Sinus: No maxillary sinus tenderness or frontal sinus tenderness.     Mouth/Throat:     Mouth: Mucous membranes are moist.     Pharynx: Oropharynx is clear. Uvula midline. No pharyngeal swelling, oropharyngeal exudate or posterior oropharyngeal erythema.  Eyes:     General: Lids are normal.        Right eye: No discharge.        Left eye: No discharge.     Extraocular Movements: Extraocular movements intact.     Conjunctiva/sclera: Conjunctivae normal.     Pupils: Pupils are equal, round, and reactive to light.     Visual Fields: Right eye visual fields normal and left eye visual fields normal.  Neck:     Thyroid: No thyromegaly.     Vascular: No carotid bruit.     Trachea: Trachea normal.  Cardiovascular:     Rate and Rhythm: Normal rate and regular rhythm.     Heart sounds: Normal heart sounds. No murmur heard. No gallop.   Pulmonary:     Effort: Pulmonary effort is normal. No accessory muscle usage or respiratory distress.     Breath sounds: Normal breath sounds.  Chest:  Breasts:     Right: Normal. No axillary adenopathy or supraclavicular adenopathy.     Left: Normal. No axillary adenopathy or supraclavicular adenopathy.    Abdominal:      General: Bowel sounds are normal.     Palpations: Abdomen is soft. There is no hepatomegaly or splenomegaly.     Tenderness: There is no abdominal tenderness.  Musculoskeletal:        General: Normal range of motion.     Cervical back: Normal range of motion and neck supple.     Right lower leg: No edema.     Left lower leg: No edema.  Lymphadenopathy:     Head:     Right side of head: No submental, submandibular, tonsillar, preauricular or posterior auricular adenopathy.     Left side of head: No submental, submandibular, tonsillar, preauricular or posterior auricular adenopathy.     Cervical: No cervical adenopathy.     Upper Body:     Right upper body: No supraclavicular, axillary or pectoral adenopathy.     Left upper body: No supraclavicular, axillary or pectoral adenopathy.  Skin:    General: Skin is warm and dry.     Capillary Refill: Capillary refill takes less than 2 seconds.     Findings: No rash.  Neurological:     Mental Status: She is alert and oriented to person, place, and time.     Cranial Nerves: Cranial nerves are intact.     Gait: Gait is intact.     Deep Tendon Reflexes: Reflexes are normal and symmetric.     Reflex Scores:      Brachioradialis reflexes are 2+ on the right side and 2+ on the left side.      Patellar reflexes are 2+ on the right side and  2+ on the left side. Psychiatric:        Attention and Perception: Attention normal.        Mood and Affect: Mood normal.        Speech: Speech normal.        Behavior: Behavior normal. Behavior is cooperative.        Thought Content: Thought content normal.        Judgment: Judgment normal.     Results for orders placed or performed during the hospital encounter of 02/26/20  I-STAT creatinine  Result Value Ref Range   Creatinine, Ser 0.80 0.44 - 1.00 mg/dL      Assessment & Plan:   Problem List Items Addressed This Visit      Cardiovascular and Mediastinum   Essential hypertension    Chronic,  ongoing with BP initially elevated today, but repeat closer to goal.  Continue current medication regimen and adjust as needed.  Refills sent in.  Labs ordered today. Recommend she monitor BP at least a few mornings a week at home and document.  DASH diet at home. Return in 6 months.       Relevant Medications   rosuvastatin (CRESTOR) 5 MG tablet   Other Relevant Orders   Comprehensive metabolic panel   TSH   Urinalysis, Routine w reflex microscopic     Other   Hyperlipidemia    Chronic, ongoing.  Patient agrees to start Crestor 5mg  in office today. Labs ordered today.  Reviewed ASCVD 12.6% with patient during visit today and increased risk for heart attack or stroke.       Relevant Medications   rosuvastatin (CRESTOR) 5 MG tablet   Other Relevant Orders   Lipid panel   TSH   Urinalysis, Routine w reflex microscopic   Major depression, recurrent, chronic (HCC)    Chronic.  Improved.  Continue with current medication regimen.  Return to clinic if symptoms worsen.  Return in 6 months for follow up.       Relevant Orders   CBC with Differential/Platelet   TSH   Urinalysis, Routine w reflex microscopic   Mixed incontinence    Chronic, ongoing.  Tried to change medication to Myrbetriq due to age and side effects of anticholinergic but it was not covered by insurance.  Will continue with Oxybutynin.   Return in 6 months.      Relevant Orders   CBC with Differential/Platelet   TSH   Urinalysis, Routine w reflex microscopic    Other Visit Diagnoses    Annual physical exam    -  Primary   Immunization due       Relevant Orders   Pneumococcal conjugate vaccine 13-valent IM (Completed)       Follow up plan: Return in about 6 months (around 12/29/2020) for HTN, HLD, DM2 FU.   LABORATORY TESTING:  - Pap smear: up to date  IMMUNIZATIONS:   - Tdap: Tetanus vaccination status reviewed: last tetanus booster within 10 years. - Influenza: Postponed to flu season - Pneumovax: Up  to date - Prevnar: Ordered today - HPV: Not applicable - Zostavax vaccine: discussed at visit today  SCREENING: -Mammogram: Up to date  - Colonoscopy: Refused  - Bone Density: Refused  -Hearing Test: Not applicable  -Spirometry: Not applicable   PATIENT COUNSELING:   Advised to take 1 mg of folate supplement per day if capable of pregnancy.   Sexuality: Discussed sexually transmitted diseases, partner selection, use of condoms, avoidance of unintended pregnancy  and contraceptive alternatives.   Advised to avoid cigarette smoking.  I discussed with the patient that most people either abstain from alcohol or drink within safe limits (<=14/week and <=4 drinks/occasion for males, <=7/weeks and <= 3 drinks/occasion for females) and that the risk for alcohol disorders and other health effects rises proportionally with the number of drinks per week and how often a drinker exceeds daily limits.  Discussed cessation/primary prevention of drug use and availability of treatment for abuse.   Diet: Encouraged to adjust caloric intake to maintain  or achieve ideal body weight, to reduce intake of dietary saturated fat and total fat, to limit sodium intake by avoiding high sodium foods and not adding table salt, and to maintain adequate dietary potassium and calcium preferably from fresh fruits, vegetables, and low-fat dairy products.    stressed the importance of regular exercise  Injury prevention: Discussed safety belts, safety helmets, smoke detector, smoking near bedding or upholstery.   Dental health: Discussed importance of regular tooth brushing, flossing, and dental visits.    NEXT PREVENTATIVE PHYSICAL DUE IN 1 YEAR. Return in about 6 months (around 12/29/2020) for HTN, HLD, DM2 FU.

## 2020-06-29 NOTE — Assessment & Plan Note (Signed)
Chronic, ongoing with BP initially elevated today, but repeat closer to goal.  Continue current medication regimen and adjust as needed.  Refills sent in.  Labs ordered today. Recommend she monitor BP at least a few mornings a week at home and document.  DASH diet at home. Return in 6 months.

## 2020-06-29 NOTE — Assessment & Plan Note (Signed)
Chronic.  Improved.  Continue with current medication regimen.  Return to clinic if symptoms worsen.  Return in 6 months for follow up.

## 2020-06-29 NOTE — Assessment & Plan Note (Signed)
Chronic, ongoing.  Patient agrees to start Crestor 5mg  in office today. Labs ordered today.  Reviewed ASCVD 12.6% with patient during visit today and increased risk for heart attack or stroke.

## 2020-06-30 LAB — CBC WITH DIFFERENTIAL/PLATELET
Basophils Absolute: 0 10*3/uL (ref 0.0–0.2)
Basos: 0 %
EOS (ABSOLUTE): 0.1 10*3/uL (ref 0.0–0.4)
Eos: 2 %
Hematocrit: 46.6 % (ref 34.0–46.6)
Hemoglobin: 15.1 g/dL (ref 11.1–15.9)
Immature Grans (Abs): 0 10*3/uL (ref 0.0–0.1)
Immature Granulocytes: 0 %
Lymphocytes Absolute: 1.3 10*3/uL (ref 0.7–3.1)
Lymphs: 19 %
MCH: 27.4 pg (ref 26.6–33.0)
MCHC: 32.4 g/dL (ref 31.5–35.7)
MCV: 85 fL (ref 79–97)
Monocytes Absolute: 0.6 10*3/uL (ref 0.1–0.9)
Monocytes: 9 %
Neutrophils Absolute: 4.9 10*3/uL (ref 1.4–7.0)
Neutrophils: 70 %
Platelets: 343 10*3/uL (ref 150–450)
RBC: 5.51 x10E6/uL — ABNORMAL HIGH (ref 3.77–5.28)
RDW: 13.1 % (ref 11.7–15.4)
WBC: 6.9 10*3/uL (ref 3.4–10.8)

## 2020-06-30 LAB — COMPREHENSIVE METABOLIC PANEL
ALT: 18 IU/L (ref 0–32)
AST: 23 IU/L (ref 0–40)
Albumin/Globulin Ratio: 2.2 (ref 1.2–2.2)
Albumin: 4.8 g/dL (ref 3.8–4.8)
Alkaline Phosphatase: 91 IU/L (ref 44–121)
BUN/Creatinine Ratio: 18 (ref 12–28)
BUN: 15 mg/dL (ref 8–27)
Bilirubin Total: 0.5 mg/dL (ref 0.0–1.2)
CO2: 23 mmol/L (ref 20–29)
Calcium: 9.6 mg/dL (ref 8.7–10.3)
Chloride: 93 mmol/L — ABNORMAL LOW (ref 96–106)
Creatinine, Ser: 0.84 mg/dL (ref 0.57–1.00)
Globulin, Total: 2.2 g/dL (ref 1.5–4.5)
Glucose: 78 mg/dL (ref 65–99)
Potassium: 4.3 mmol/L (ref 3.5–5.2)
Sodium: 135 mmol/L (ref 134–144)
Total Protein: 7 g/dL (ref 6.0–8.5)
eGFR: 76 mL/min/{1.73_m2} (ref >59)

## 2020-06-30 LAB — LIPID PANEL
Chol/HDL Ratio: 2.7 ratio (ref 0.0–4.4)
Cholesterol, Total: 227 mg/dL — ABNORMAL HIGH (ref 100–199)
HDL: 84 mg/dL (ref >39)
LDL Chol Calc (NIH): 127 mg/dL — ABNORMAL HIGH (ref 0–99)
Triglycerides: 94 mg/dL (ref 0–149)
VLDL Cholesterol Cal: 16 mg/dL (ref 5–40)

## 2020-06-30 LAB — TSH: TSH: 2.2 u[IU]/mL (ref 0.450–4.500)

## 2020-06-30 NOTE — Progress Notes (Signed)
Please let patient know that her lab work looks good. Her urine did show some abnormal cells that we will check again at her next visit. At this time it is not concerning and since she is not having symptoms of a UTI we will just monitor it. Patient's liver, kidney's, TSH, and electrolytes look good.  Cholesterol is elevated from prior.  However, we started the Crestor at her visit so I expect to see them improve the next time we check her labs. Follow up as discussed.

## 2020-08-17 ENCOUNTER — Telehealth: Payer: Self-pay

## 2020-08-17 MED ORDER — VENLAFAXINE HCL 75 MG PO TABS: 75.0000 mg | ORAL_TABLET | Freq: Every day | ORAL | 0 refills | Status: DC

## 2020-08-17 NOTE — Telephone Encounter (Signed)
Can we find out what patient's questions are?

## 2020-08-17 NOTE — Telephone Encounter (Signed)
I would like to wean off one medication at a time if that is okay with the patient beginning with the Effexor.  I will send in 75mg  tab for her to take everyday for the next week.  After that she should cut the tab in half and take half for one week.  Then half a tab every other day for 1 week. Then she can stop taking the medication.  After she completes this we can work on weaning off the Wellbutrin.

## 2020-08-17 NOTE — Telephone Encounter (Signed)
Patient notified

## 2020-08-17 NOTE — Telephone Encounter (Signed)
Copied from CRM [id number]. Topic: General - Other >> Aug 17, 2020 11:34 AM Glean Salen wrote: Reason for CRM: patient called in wants to discuss getting weened off buPROPion (WELLBUTRIN XL) 300 MG 24 hr tablet and venlafaxine XR (EFFEXOR XR) 150 MG 24 hr capsule  Please call back.

## 2020-08-17 NOTE — Telephone Encounter (Signed)
Patient states she discussed at last visit that she was having severe dry mouth and believes it is coming from the medications, also states she has noticed some weight gain. Patient states she would like to ween off of them as well due to her mood doing better in the summer time because she is able to be outdoors more.

## 2020-09-06 ENCOUNTER — Telehealth: Payer: Self-pay

## 2020-09-06 NOTE — Telephone Encounter (Signed)
Copied from CRM [id number]. Topic: General - Other >> Sep 06, 2020 11:11 AM Pawlus, Maxine Glenn A wrote: Reason for CRM: Pt was calling in stating she is supposed to taper off of buPROPion (WELLBUTRIN XL) 300 MG 24 hr tablet, pt wanted a call back to go over how to correctly get off this medication.

## 2020-09-06 NOTE — Telephone Encounter (Signed)
Has patient successfully weaned off the Effexor?  If so, did she have any side effects while doing so?

## 2020-09-07 MED ORDER — BUPROPION HCL ER (SR) 150 MG PO TB12: 150.0000 mg | ORAL_TABLET | Freq: Two times a day (BID) | ORAL | 0 refills | Status: DC

## 2020-09-07 NOTE — Telephone Encounter (Signed)
Called and spoke with patient. States she would sometimes cry more than usual. States no real physical side effects. States some days she felt like she didn't really want to do anything.

## 2020-09-07 NOTE — Telephone Encounter (Signed)
Patient notified of Karen's instructions.

## 2020-09-07 NOTE — Telephone Encounter (Signed)
Wellbutrin 150mg  sent to the pharmacy today. She should take 1 tab daily for the next week. Then she should take one tab every other day for a week and then 1 tab every 3 days for a week then stop the medication.

## 2020-09-09 ENCOUNTER — Telehealth: Payer: Self-pay | Admitting: Nurse Practitioner

## 2020-09-09 NOTE — Telephone Encounter (Signed)
Yes, don't look at the directions on the bottle, do as she and Clydie Braun discussed:   "Wellbutrin 150mg  sent to the pharmacy today. She should take 1 tab daily for the next week. Then she should take one tab every other day for a week and then 1 tab every 3 days for a week then stop the medication"

## 2020-09-09 NOTE — Telephone Encounter (Signed)
The instructions for pts buPROPion (WELLBUTRIN SR) 150 MG 12 hr tablet States to take 2 a day but pt stated she discussed different directions with her PCP / she stated she was suppose to ween off /pt wants to makes sure that she is suppose to take it starting one a day the 1st week and the second week its one every other day and week 3 to take every three days / please advise / if no answer leave a message

## 2020-09-09 NOTE — Telephone Encounter (Signed)
Pt last seen 4/20 next scheduled appt is 10/26. Please advise

## 2020-09-09 NOTE — Telephone Encounter (Signed)
Patient is aware of correct way to take medication. Confirmed with pharmacy that rx was received and will be filled.

## 2020-09-23 ENCOUNTER — Ambulatory Visit: Payer: Self-pay

## 2020-09-23 NOTE — Telephone Encounter (Signed)
Patient called and says she's been on Ditropan for a good while now and she's noticed a little bladder leakage. She says she is just wondering if the medication is still working. She asks should she just monitor it and call back if it's worse. I advised to monitor and call back, advised I will send this to Clydie Braun for review and recommendation. Patient verbalized understanding.  Reason for Disposition  [1] Caller has NON-URGENT medicine question about med that PCP prescribed AND [2] triager unable to answer question  Answer Assessment - Initial Assessment Questions 1. NAME of MEDICATION: "What medicine are you calling about?"     Ditropan XL 2. QUESTION: "What is your question?" (e.g., double dose of medicine, side effect)     Is it still working 3. PRESCRIBING HCP: "Who prescribed it?" Reason: if prescribed by specialist, call should be referred to that group.     N/A 4. SYMPTOMS: "Do you have any symptoms?"     None 5. SEVERITY: If symptoms are present, ask "Are they mild, moderate or severe?"     N/A 6. PREGNANCY:  "Is there any chance that you are pregnant?" "When was your last menstrual period?"     N/A  Protocols used: Medication Question Call-A-AH

## 2020-09-26 NOTE — Telephone Encounter (Signed)
Left message for patient to give our office a call back her earliest convenience to discuss recommendations per Larae Grooms, NP

## 2020-09-26 NOTE — Telephone Encounter (Signed)
FYI

## 2020-09-26 NOTE — Telephone Encounter (Signed)
Recommend she monitor and call back if it persists.

## 2020-09-27 NOTE — Telephone Encounter (Signed)
Patient notified

## 2020-11-08 ENCOUNTER — Telehealth: Payer: Self-pay | Admitting: Nurse Practitioner

## 2020-11-08 DIAGNOSIS — N3946 Mixed incontinence: Secondary | ICD-10-CM

## 2020-11-08 MED ORDER — OXYBUTYNIN CHLORIDE ER 10 MG PO TB24: 10.0000 mg | ORAL_TABLET | Freq: Every day | ORAL | 1 refills | Status: DC

## 2020-11-08 NOTE — Telephone Encounter (Signed)
Copied from CRM [id number]. Topic: General - Other >> Nov 08, 2020 11:17 AM Gwenlyn Fudge wrote: Reason for CRM: Pt called to speak with PCP. She states that her bladder issue is getting worse. She states that there is more leakage and that she is having to go right then and there. She states that she is completely out of the medication to help with this, oxybutynin. She is requesting to have this refilled as well. Pt states that she has contacted pharmacy and they told her that she is no longer taking this. Please advise.      [id number] Doreatha Martin, [employer] [employer] - [address] AT [facility] [address] [place] [address] Phone: [phone] Fax: [phone] Hours: Not open 24 hours

## 2020-11-08 NOTE — Telephone Encounter (Signed)
Routing to provider to advise.  

## 2020-11-08 NOTE — Telephone Encounter (Signed)
I sent the medication refill to the pharmacy. I also placed a referral for patient to see Urology due to the worsening symptoms.

## 2021-01-02 ENCOUNTER — Ambulatory Visit (INDEPENDENT_AMBULATORY_CARE_PROVIDER_SITE_OTHER): Payer: Medicare Other

## 2021-01-02 VITALS — Ht 61.0 in | Wt 116.0 lb

## 2021-01-02 DIAGNOSIS — Z0000 Encounter for general adult medical examination without abnormal findings: Secondary | ICD-10-CM

## 2021-01-02 NOTE — Patient Instructions (Signed)
[name] , Thank you for taking time to come for your Medicare Wellness Visit. I appreciate your ongoing commitment to your health goals. Please review the following plan we discussed and let me know if I can assist you in the future.   Screening recommendations/referrals: Colonoscopy: decline Mammogram: decline Bone Density: decline Recommended yearly ophthalmology/optometry visit for glaucoma screening and checkup Recommended yearly dental visit for hygiene and checkup  Vaccinations: Influenza vaccine: due Pneumococcal vaccine: completed 06/29/2020 Tdap vaccine: completed 03/08/2019 Shingles vaccine: decline   Covid-19: 07/16/2020, 06/02/2019, 05/08/2019  Advanced directives: Please bring a copy of your POA (Power of Attorney) and/or Living Will to your next appointment.   Conditions/risks identified: none  Next appointment: Follow up in one year for your annual wellness visit    Preventive Care 65 Years and Older, Female Preventive care refers to lifestyle choices and visits with your health care provider that can promote health and wellness. What does preventive care include? A yearly physical exam. This is also called an annual well check. Dental exams once or twice a year. Routine eye exams. Ask your health care provider how often you should have your eyes checked. Personal lifestyle choices, including: Daily care of your teeth and gums. Regular physical activity. Eating a healthy diet. Avoiding tobacco and drug use. Limiting alcohol use. Practicing safe sex. Taking low-dose aspirin every day. Taking vitamin and mineral supplements as recommended by your health care provider. What happens during an annual well check? The services and screenings done by your health care provider during your annual well check will depend on your age, overall health, lifestyle risk factors, and family history of disease. Counseling  Your health care provider may ask you questions about  your: Alcohol use. Tobacco use. Drug use. Emotional well-being. Home and relationship well-being. Sexual activity. Eating habits. History of falls. Memory and ability to understand (cognition). Work and work Astronomer. Reproductive health. Screening  You may have the following tests or measurements: Height, weight, and BMI. Blood pressure. Lipid and cholesterol levels. These may be checked every 5 years, or more frequently if you are over 42 years old. Skin check. Lung cancer screening. You may have this screening every year starting at age 76 if you have a 30-pack-year history of smoking and currently smoke or have quit within the past 15 years. Fecal occult blood test (FOBT) of the stool. You may have this test every year starting at age 46. Flexible sigmoidoscopy or colonoscopy. You may have a sigmoidoscopy every 5 years or a colonoscopy every 10 years starting at age 64. Hepatitis C blood test. Hepatitis B blood test. Sexually transmitted disease (STD) testing. Diabetes screening. This is done by checking your blood sugar (glucose) after you have not eaten for a while (fasting). You may have this done every 1-3 years. Bone density scan. This is done to screen for osteoporosis. You may have this done starting at age 58. Mammogram. This may be done every 1-2 years. Talk to your health care provider about how often you should have regular mammograms. Talk with your health care provider about your test results, treatment options, and if necessary, the need for more tests. Vaccines  Your health care provider may recommend certain vaccines, such as: Influenza vaccine. This is recommended every year. Tetanus, diphtheria, and acellular pertussis (Tdap, Td) vaccine. You may need a Td booster every 10 years. Zoster vaccine. You may need this after age 78. Pneumococcal 13-valent conjugate (PCV13) vaccine. One dose is recommended after age 22. Pneumococcal  polysaccharide (PPSV23) vaccine.  One dose is recommended after age 81. Talk to your health care provider about which screenings and vaccines you need and how often you need them. This information is not intended to replace advice given to you by your health care provider. Make sure you discuss any questions you have with your health care provider. Document Released: 03/25/2015 Document Revised: 11/16/2015 Document Reviewed: 12/28/2014 Elsevier Interactive Patient Education  2017 [place] Prevention in the Home Falls can cause injuries. They can happen to people of all ages. There are many things you can do to make your home safe and to help prevent falls. What can I do on the outside of my home? Regularly fix the edges of walkways and driveways and fix any cracks. Remove anything that might make you trip as you walk through a door, such as a raised step or threshold. Trim any bushes or trees on the path to your home. Use bright outdoor lighting. Clear any walking paths of anything that might make someone trip, such as rocks or tools. Regularly check to see if handrails are loose or broken. Make sure that both sides of any steps have handrails. Any raised decks and porches should have guardrails on the edges. Have any leaves, snow, or ice cleared regularly. Use sand or salt on walking paths during winter. Clean up any spills in your garage right away. This includes oil or grease spills. What can I do in the bathroom? Use night lights. Install grab bars by the toilet and in the tub and shower. Do not use towel bars as grab bars. Use non-skid mats or decals in the tub or shower. If you need to sit down in the shower, use a plastic, non-slip stool. Keep the floor dry. Clean up any water that spills on the floor as soon as it happens. Remove soap buildup in the tub or shower regularly. Attach bath mats securely with double-sided non-slip rug tape. Do not have throw rugs and other things on the floor that can make  you trip. What can I do in the bedroom? Use night lights. Make sure that you have a light by your bed that is easy to reach. Do not use any sheets or blankets that are too big for your bed. They should not hang down onto the floor. Have a firm chair that has side arms. You can use this for support while you get dressed. Do not have throw rugs and other things on the floor that can make you trip. What can I do in the kitchen? Clean up any spills right away. Avoid walking on wet floors. Keep items that you use a lot in easy-to-reach places. If you need to reach something above you, use a strong step stool that has a grab bar. Keep electrical cords out of the way. Do not use floor polish or wax that makes floors slippery. If you must use wax, use non-skid floor wax. Do not have throw rugs and other things on the floor that can make you trip. What can I do with my stairs? Do not leave any items on the stairs. Make sure that there are handrails on both sides of the stairs and use them. Fix handrails that are broken or loose. Make sure that handrails are as long as the stairways. Check any carpeting to make sure that it is firmly attached to the stairs. Fix any carpet that is loose or worn. Avoid having throw rugs at the top  or bottom of the stairs. If you do have throw rugs, attach them to the floor with carpet tape. Make sure that you have a light switch at the top of the stairs and the bottom of the stairs. If you do not have them, ask someone to add them for you. What else can I do to help prevent falls? Wear shoes that: Do not have high heels. Have rubber bottoms. Are comfortable and fit you well. Are closed at the toe. Do not wear sandals. If you use a stepladder: Make sure that it is fully opened. Do not climb a closed stepladder. Make sure that both sides of the stepladder are locked into place. Ask someone to hold it for you, if possible. Clearly mark and make sure that you can  see: Any grab bars or handrails. First and last steps. Where the edge of each step is. Use tools that help you move around (mobility aids) if they are needed. These include: Canes. Walkers. Scooters. Crutches. Turn on the lights when you go into a dark area. Replace any light bulbs as soon as they burn out. Set up your furniture so you have a clear path. Avoid moving your furniture around. If any of your floors are uneven, fix them. If there are any pets around you, be aware of where they are. Review your medicines with your doctor. Some medicines can make you feel dizzy. This can increase your chance of falling. Ask your doctor what other things that you can do to help prevent falls. This information is not intended to replace advice given to you by your health care provider. Make sure you discuss any questions you have with your health care provider. Document Released: 12/23/2008 Document Revised: 08/04/2015 Document Reviewed: 04/02/2014 Elsevier Interactive Patient Education  2017 [employer].

## 2021-01-02 NOTE — Progress Notes (Signed)
I connected with Eileen Stewart today by telephone and verified that I am speaking with the correct person using two identifiers. Location patient: home Location provider: work Persons participating in the virtual visit: Tanishia, [place] LPN.   I discussed the limitations, risks, security and privacy concerns of performing an evaluation and management service by telephone and the availability of in person appointments. I also discussed with the patient that there may be a patient responsible charge related to this service. The patient expressed understanding and verbally consented to this telephonic visit.    Interactive audio and video telecommunications were attempted between this provider and patient, however failed, due to patient having technical difficulties OR patient did not have access to video capability.  We continued and completed visit with audio only.     Vital signs may be patient reported or missing.  Subjective:   Eileen Stewart is a 70 y.o. female who presents for Medicare Annual (Subsequent) preventive examination.  Review of Systems     Cardiac Risk Factors include: advanced age (>2men, >36 women);dyslipidemia;hypertension;sedentary lifestyle     Objective:    Today's Vitals   01/02/21 1527 01/02/21 1528  Weight: 116 lb (52.6 kg)   Height: 5\' 1"  (1.549 m)   PainSc:  1    Body mass index is 21.92 kg/m.  Advanced Directives 01/02/2021 01/01/2020 03/08/2019 12/29/2018 07/30/2017  Does Patient Have a Medical Advance Directive? Yes Yes Yes Yes Yes  Type of 08/01/2017 of [place];Living will Healthcare Power of [place];Living will Healthcare Power of [place];Living will Living will;Healthcare Power of [place] Power of [place];Living will  Copy of Healthcare Power of Attorney in Chart? No - copy requested No - copy requested - No - copy requested -    Current Medications (verified) Outpatient Encounter  Medications as of 01/02/2021  Medication Sig   ASPIRIN LOW DOSE 81 MG EC tablet TAKE 1 TABLET(81 MG) BY MOUTH DAILY   buPROPion (WELLBUTRIN SR) 150 MG 12 hr tablet Take 1 tablet (150 mg total) by mouth 2 (two) times daily.   lisinopril-hydrochlorothiazide (ZESTORETIC) 20-12.5 MG tablet Take 1 tablet by mouth daily.   oxybutynin (DITROPAN-XL) 10 MG 24 hr tablet Take 1 tablet (10 mg total) by mouth at bedtime.   rosuvastatin (CRESTOR) 5 MG tablet Take 1 tablet (5 mg total) by mouth daily.   venlafaxine (EFFEXOR) 75 MG tablet Take 1 tablet (75 mg total) by mouth daily. (Patient not taking: Reported on 01/02/2021)   No facility-administered encounter medications on file as of 01/02/2021.    Allergies (verified) Patient has no known allergies.   History: Past Medical History:  Diagnosis Date   Acute pyelonephritis    Alcohol abuse    Alcohol, remission since 2013   Anemia    Asymptomatic varicose veins, unspecified laterality    Depression    Essential tremor    Hyperlipidemia    Hypertension    Liver mass    Vitamin D deficiency    Past Surgical History:  Procedure Laterality Date   SPLENECTOMY     Family History  Problem Relation Age of Onset   Hyperlipidemia Mother    Hypertension Mother    Alcohol abuse Father    Depression Sister    Anxiety disorder Sister    Parkinson's disease Cousin    Social History   Socioeconomic History   Marital status: Divorced    Spouse name: Not on file   Number of children: Not on file   Years  of education: Not on file   Highest education level: Associate degree: academic program  Occupational History   Occupation: retired  Tobacco Use   Smoking status: Never   Smokeless tobacco: Never  Vaping Use   Vaping Use: Never used  Substance and Sexual Activity   Alcohol use: Yes    Comment: 3 beers a month on average    Drug use: No   Sexual activity: Not on file  Other Topics Concern   Not on file  Social History Narrative   Not  on file   Social Determinants of Health   Financial Resource Strain: Low Risk    Difficulty of Paying Living Expenses: Not hard at all  Food Insecurity: No Food Insecurity   Worried About Programme researcher, broadcasting/film/video in the Last Year: Never true   Ran Out of Food in the Last Year: Never true  Transportation Needs: No Transportation Needs   Lack of Transportation (Medical): No   Lack of Transportation (Non-Medical): No  Physical Activity: Inactive   Days of Exercise per Week: 0 days   Minutes of Exercise per Session: 0 min  Stress: No Stress Concern Present   Feeling of Stress : Not at all  Social Connections: Not on file    Tobacco Counseling Counseling given: Not Answered   Clinical Intake:  Pre-visit preparation completed: Yes  Pain : 0-10 Pain Score: 1  Pain Type: Acute pain Pain Location: Hand Pain Orientation: Left Pain Descriptors / Indicators: Aching Pain Onset: More than a month ago Pain Frequency: Intermittent     Nutritional Status: BMI of 19-24  Normal Nutritional Risks: None Diabetes: No  How often do you need to have someone help you when you read instructions, pamphlets, or other written materials from your doctor or pharmacy?: 1 - Never What is the last grade level you completed in school?: associates degree  Diabetic? no  Interpreter Needed?: No  Information entered by :: NAllen LPN   Activities of Daily Living In your present state of health, do you have any difficulty performing the following activities: 01/02/2021  Hearing? N  Vision? N  Difficulty concentrating or making decisions? Y  Walking or climbing stairs? N  Dressing or bathing? N  Doing errands, shopping? N  Preparing Food and eating ? N  Using the Toilet? N  In the past six months, have you accidently leaked urine? Y  Do you have problems with loss of bowel control? N  Managing your Medications? N  Managing your Finances? N  Housekeeping or managing your Housekeeping? N  Some  recent data might be hidden    Patient Care Team: Larae Grooms, NP as PCP - General  Indicate any recent Medical Services you may have received from other than [facility] providers in the past year (date may be approximate).     Assessment:   This is a routine wellness examination for Eileen Stewart.  Hearing/Vision screen Vision Screening - Comments:: Regular eye exams, [facility]  Dietary issues and exercise activities discussed: Current Exercise Habits: The patient does not participate in regular exercise at present   Goals Addressed             This Visit's Progress    Patient Stated       01/02/2021, wants to start walking       Depression Screen PHQ 2/9 Scores 01/02/2021 06/29/2020 02/29/2020 01/01/2020 03/27/2019 12/29/2018 08/01/2018  PHQ - 2 Score 5 2 0 0 0 0 6  PHQ- 9 Score  11 7 0 0 1 - 15    Fall Risk Fall Risk  01/02/2021 02/29/2020 02/29/2020 01/01/2020 03/27/2019  Falls in the past year? 0 1 1 0 0  Number falls in past yr: - 1 1 - 0  Injury with Fall? - 0 1 - 0  Risk for fall due to : Medication side effect Impaired balance/gait Impaired balance/gait Medication side effect -  Follow up Falls evaluation completed;Education provided;Falls prevention discussed Falls prevention discussed Falls evaluation completed Falls evaluation completed;Education provided;Falls prevention discussed -    FALL RISK PREVENTION PERTAINING TO THE HOME:  Any stairs in or around the home? Yes  If so, are there any without handrails? No  Home free of loose throw rugs in walkways, pet beds, electrical cords, etc? Yes  Adequate lighting in your home to reduce risk of falls? Yes   ASSISTIVE DEVICES UTILIZED TO PREVENT FALLS:  Life alert? No  Use of a cane, walker or w/c? No  Grab bars in the bathroom? Yes  Shower chair or bench in shower? Yes  Elevated toilet seat or a handicapped toilet? No   TIMED UP AND GO:  Was the test performed? No .      Cognitive Function:      6CIT Screen 01/02/2021 01/01/2020 12/23/2017  What Year? 0 points 0 points 4 points  What month? 0 points 0 points 3 points  What time? 0 points 0 points 3 points  Count back from 20 0 points 0 points 4 points  Months in reverse 0 points 0 points 4 points  Repeat phrase 0 points 0 points 8 points  Total Score 0 0 26    Immunizations Immunization History  Administered Date(s) Administered   Influenza, High Dose Seasonal PF 11/20/2017   Influenza,inj,Quad PF,6+ Mos 11/14/2016   Influenza-Unspecified 11/14/2016   Meningococcal Conjugate 10/06/2015   Moderna SARS-COV2 Booster Vaccination 07/16/2020   PFIZER(Purple Top)SARS-COV-2 Vaccination 05/08/2019, 06/02/2019   PPD Test 12/24/2012   Pneumococcal Conjugate-13 11/14/2016, 06/29/2020   Pneumococcal Polysaccharide-23 10/08/2014   Td 06/28/2014   Tdap 03/08/2019    TDAP status: Up to date  Flu Vaccine status: Due, Education has been provided regarding the importance of this vaccine. Advised may receive this vaccine at local pharmacy or Health Dept. Aware to provide a copy of the vaccination record if obtained from local pharmacy or Health Dept. Verbalized acceptance and understanding.  Pneumococcal vaccine status: Up to date  Covid-19 vaccine status: Completed vaccines  Qualifies for Shingles Vaccine? Yes   Zostavax completed No   Shingrix Completed?: No.    Education has been provided regarding the importance of this vaccine. Patient has been advised to call insurance company to determine out of pocket expense if they have not yet received this vaccine. Advised may also receive vaccine at local pharmacy or Health Dept. Verbalized acceptance and understanding.  Screening Tests Health Maintenance  Topic Date Due   COVID-19 Vaccine (3 - Booster for Pfizer series) 09/10/2020   INFLUENZA VACCINE  10/10/2020   MAMMOGRAM  02/28/2021 (Originally 01/03/2018)   Zoster Vaccines- Shingrix (1 of 2) 04/04/2021 (Originally 10/11/2002)    DEXA SCAN  05/09/2021 (Originally 10/10/2017)   COLONOSCOPY (Pts 45-22yrs Insurance coverage will need to be confirmed)  05/09/2021 (Originally 10/10/1997)   Pneumonia Vaccine 3+ Years old (3) 06/29/2021   TETANUS/TDAP  03/07/2029   Hepatitis C Screening  Completed   HPV VACCINES  Aged Out    Health Maintenance  Health Maintenance Due  Topic Date Due  COVID-19 Vaccine (3 - Booster for Pfizer series) 09/10/2020   INFLUENZA VACCINE  10/10/2020    Colorectal cancer screening: decline   Mammogram status: decline  Bone Density status: decline  Lung Cancer Screening: (Low Dose CT Chest recommended if Age 96-80 years, 30 pack-year currently smoking OR have quit w/in 15years.) does not qualify.   Lung Cancer Screening Referral: no  Additional Screening:  Hepatitis C Screening: does qualify; Completed 10/05/2014  Vision Screening: Recommended annual ophthalmology exams for early detection of glaucoma and other disorders of the eye. Is the patient up to date with their annual eye exam?  Yes  Who is the provider or what is the name of the office in which the patient attends annual eye exams? Goes in [place] If pt is not established with a provider, would they like to be referred to a provider to establish care? No .   Dental Screening: Recommended annual dental exams for proper oral hygiene  Community Resource Referral / Chronic Care Management: CRR required this visit?  No   CCM required this visit?  No      Plan:     I have personally reviewed and noted the following in the patient's chart:   Medical and social history Use of alcohol, tobacco or illicit drugs  Current medications and supplements including opioid prescriptions.  Functional ability and status Nutritional status Physical activity Advanced directives List of other physicians Hospitalizations, surgeries, and ER visits in previous 12 months Vitals Screenings to include cognitive, depression, and  falls Referrals and appointments  In addition, I have reviewed and discussed with patient certain preventive protocols, quality metrics, and best practice recommendations. A written personalized care plan for preventive services as well as general preventive health recommendations were provided to patient.     Barb Merino, LPN   [id number]   Nurse Notes:

## 2021-01-03 ENCOUNTER — Other Ambulatory Visit: Payer: Self-pay

## 2021-01-03 ENCOUNTER — Telehealth: Payer: Self-pay | Admitting: Nurse Practitioner

## 2021-01-03 ENCOUNTER — Encounter: Payer: Self-pay | Admitting: Nurse Practitioner

## 2021-01-03 ENCOUNTER — Ambulatory Visit (INDEPENDENT_AMBULATORY_CARE_PROVIDER_SITE_OTHER): Payer: Medicare Other | Admitting: Nurse Practitioner

## 2021-01-03 VITALS — BP 136/76 | HR 62 | Temp 98.7°F | Resp 18 | Ht 61.0 in | Wt 116.0 lb

## 2021-01-03 DIAGNOSIS — I10 Essential (primary) hypertension: Secondary | ICD-10-CM | POA: Diagnosis not present

## 2021-01-03 DIAGNOSIS — F339 Major depressive disorder, recurrent, unspecified: Secondary | ICD-10-CM | POA: Diagnosis not present

## 2021-01-03 DIAGNOSIS — E782 Mixed hyperlipidemia: Secondary | ICD-10-CM

## 2021-01-03 MED ORDER — LISINOPRIL-HYDROCHLOROTHIAZIDE 20-12.5 MG PO TABS: 1.0000 | ORAL_TABLET | Freq: Every day | ORAL | 1 refills | Status: DC

## 2021-01-03 MED ORDER — ROSUVASTATIN CALCIUM 10 MG PO TABS
10.0000 mg | ORAL_TABLET | Freq: Every day | ORAL | 1 refills | Status: DC
Start: 2021-01-03 — End: 2021-05-24

## 2021-01-03 MED ORDER — OXYBUTYNIN CHLORIDE ER 10 MG PO TB24: 10.0000 mg | ORAL_TABLET | Freq: Every day | ORAL | 1 refills | Status: DC

## 2021-01-03 NOTE — Assessment & Plan Note (Signed)
Chronic. Will increase Crestor from 5mg  to 10mg  to increase benefits and reduce risk of heart attack and stroke.  Labs ordered today. Will make recommendations based on lab results.

## 2021-01-03 NOTE — Telephone Encounter (Signed)
Copied from CRM [id number]. Topic: Appointment Scheduling - Scheduling Inquiry for Clinic >> Dec 29, 2020  1:37 PM Randol Kern wrote: Reason for CRM: Pt would prefer to have a phone call appt for her AWV   Best contact: [phone] >> Jan 02, 2021  7:55 AM Channing Mutters A wrote: This has been noted. >> Dec 30, 2020  4:11 PM Pawlus, Maxine Glenn A wrote: Pt called back regarding her earlier question, pt wants to have the appt on 10/24 over the phone, please advise.

## 2021-01-03 NOTE — Progress Notes (Signed)
BP 136/76   Pulse 62   Temp 98.7 F (37.1 C) (Oral)   Resp 18   Ht _0  (1.549 m)   Wt 116 lb (52.6 kg)   SpO2 99%   BMI 21.92 kg/m    Subjective:    Patient ID: Eileen Stewart, female    DOB: 1950-06-18, 70 y.o.   MRN: [id number]  HPI: Eileen Stewart is a 70 y.o. female  Chief Complaint  Patient presents with   Follow-up   HYPERTENSION / HYPERLIPIDEMIA Satisfied with current treatment? no Duration of hypertension: years BP monitoring frequency: not checking BP range:  BP medication side effects: no Past BP meds: lisinopril-HCTZ Duration of hyperlipidemia: years Cholesterol medication side effects: no Cholesterol supplements: none Past cholesterol medications: rosuvastatin (crestor) Medication compliance: excellent compliance Aspirin: yes Recent stressors: no Recurrent headaches: no Visual changes: no Palpitations: no Dyspnea: no Chest pain: no Lower extremity edema: no Dizzy/lightheaded: no  DEPRESSION Patient states she is not taking the Wellbutrin or Effexor.  Patient states she doesn't want to take the medication so she is working on it without medication.   Relevant past medical, surgical, family and social history reviewed and updated as indicated. Interim medical history since our last visit reviewed. Allergies and medications reviewed and updated.  Review of Systems  Eyes:  Negative for visual disturbance.  Respiratory:  Negative for cough, chest tightness and shortness of breath.   Cardiovascular:  Negative for chest pain, palpitations and leg swelling.  Neurological:  Negative for dizziness and headaches.   Per HPI unless specifically indicated above     Objective:    BP 136/76   Pulse 62   Temp 98.7 F (37.1 C) (Oral)   Resp 18   Ht _1  (1.549 m)   Wt 116 lb (52.6 kg)   SpO2 99%   BMI 21.92 kg/m   Wt Readings from Last 3 Encounters:  01/03/21 116 lb (52.6 kg)  01/02/21 116 lb (52.6 kg)  06/29/20 117 lb 6 oz (53.2 kg)     Physical Exam Vitals and nursing note reviewed.  Constitutional:      General: She is not in acute distress.    Appearance: Normal appearance. She is normal weight. She is not ill-appearing, toxic-appearing or diaphoretic.  HENT:     Head: Normocephalic.     Right Ear: External ear normal.     Left Ear: External ear normal.     Nose: Nose normal.     Mouth/Throat:     Mouth: Mucous membranes are moist.     Pharynx: Oropharynx is clear.  Eyes:     General:        Right eye: No discharge.        Left eye: No discharge.     Extraocular Movements: Extraocular movements intact.     Conjunctiva/sclera: Conjunctivae normal.     Pupils: Pupils are equal, round, and reactive to light.  Cardiovascular:     Rate and Rhythm: Normal rate and regular rhythm.     Heart sounds: No murmur heard. Pulmonary:     Effort: Pulmonary effort is normal. No respiratory distress.     Breath sounds: Normal breath sounds. No wheezing or rales.  Musculoskeletal:     Cervical back: Normal range of motion and neck supple.  Skin:    General: Skin is warm and dry.     Capillary Refill: Capillary refill takes less than 2 seconds.  Neurological:     General: No focal deficit  present.     Mental Status: She is alert and oriented to person, place, and time. Mental status is at baseline.  Psychiatric:        Mood and Affect: Mood normal.        Behavior: Behavior normal.        Thought Content: Thought content normal.        Judgment: Judgment normal.    Results for orders placed or performed in visit on 06/29/20  Microscopic Examination   Urine  Result Value Ref Range   WBC, UA 0-5 0 - 5 /hpf   RBC 0-2 0 - 2 /hpf   Epithelial Cells (non renal) 0-10 0 - 10 /hpf   Renal Epithel, UA 0-10 (A) None seen /hpf   Casts Present (A) None seen /lpf   Cast Type Hyaline casts N/A   Mucus, UA Present (A) Not Estab.   Bacteria, UA None seen None seen/Few  CBC with Differential/Platelet  Result Value Ref Range    WBC 6.9 3.4 - 10.8 x10E3/uL   RBC 5.51 (H) 3.77 - 5.28 x10E6/uL   Hemoglobin 15.1 11.1 - 15.9 g/dL   Hematocrit 46.6 34.0 - 46.6 %   MCV 85 79 - 97 fL   MCH 27.4 26.6 - 33.0 pg   MCHC 32.4 31.5 - 35.7 g/dL   RDW 13.1 11.7 - 15.4 %   Platelets 343 150 - 450 x10E3/uL   Neutrophils 70 Not Estab. %   Lymphs 19 Not Estab. %   Monocytes 9 Not Estab. %   Eos 2 Not Estab. %   Basos 0 Not Estab. %   Neutrophils Absolute 4.9 1.4 - 7.0 x10E3/uL   Lymphocytes Absolute 1.3 0.7 - 3.1 x10E3/uL   Monocytes Absolute 0.6 0.1 - 0.9 x10E3/uL   EOS (ABSOLUTE) 0.1 0.0 - 0.4 x10E3/uL   Basophils Absolute 0.0 0.0 - 0.2 x10E3/uL   Immature Granulocytes 0 Not Estab. %   Immature Grans (Abs) 0.0 0.0 - 0.1 x10E3/uL  Comprehensive metabolic panel  Result Value Ref Range   Glucose 78 65 - 99 mg/dL   BUN 15 8 - 27 mg/dL   Creatinine, Ser 0.84 0.57 - 1.00 mg/dL   eGFR 76 >59 mL/min/1.73   BUN/Creatinine Ratio 18 12 - 28   Sodium 135 134 - 144 mmol/L   Potassium 4.3 3.5 - 5.2 mmol/L   Chloride 93 (L) 96 - 106 mmol/L   CO2 23 20 - 29 mmol/L   Calcium 9.6 8.7 - 10.3 mg/dL   Total Protein 7.0 6.0 - 8.5 g/dL   Albumin 4.8 3.8 - 4.8 g/dL   Globulin, Total 2.2 1.5 - 4.5 g/dL   Albumin/Globulin Ratio 2.2 1.2 - 2.2   Bilirubin Total 0.5 0.0 - 1.2 mg/dL   Alkaline Phosphatase 91 44 - 121 IU/L   AST 23 0 - 40 IU/L   ALT 18 0 - 32 IU/L  Lipid panel  Result Value Ref Range   Cholesterol, Total 227 (H) 100 - 199 mg/dL   Triglycerides 94 0 - 149 mg/dL   HDL 84 >39 mg/dL   VLDL Cholesterol Cal 16 5 - 40 mg/dL   LDL Chol Calc (NIH) 127 (H) 0 - 99 mg/dL   Chol/HDL Ratio 2.7 0.0 - 4.4 ratio  TSH  Result Value Ref Range   TSH 2.200 0.450 - 4.500 uIU/mL  Urinalysis, Routine w reflex microscopic  Result Value Ref Range   Specific Gravity, UA 1.020 1.005 - 1.030   pH,  UA 7.0 5.0 - 7.5   Color, UA Yellow Yellow   Appearance Ur Clear Clear   Leukocytes,UA Trace (A) Negative   Protein,UA Negative Negative/Trace    Glucose, UA Negative Negative   Ketones, UA Negative Negative   RBC, UA Negative Negative   Bilirubin, UA Negative Negative   Urobilinogen, Ur 0.2 0.2 - 1.0 mg/dL   Nitrite, UA Negative Negative   Microscopic Examination See below:       Assessment & Plan:   Problem List Items Addressed This Visit       Cardiovascular and Mediastinum   Essential hypertension - Primary    Chronic.  Controlled.  Continue with current medication regimen of Lisinpril 50m and HCTZ of 12.52m  Labs ordered today.  Return to clinic in 6 months for reevaluation.  Call sooner if concerns arise.        Relevant Medications   lisinopril-hydrochlorothiazide (ZESTORETIC) 20-12.5 MG tablet   rosuvastatin (CRESTOR) 10 MG tablet   Other Relevant Orders   Comp Met (CMET)   Lipid Profile     Other   Hyperlipidemia    Chronic. Will increase Crestor from 5m53mo 5m57m increase benefits and reduce risk of heart attack and stroke.  Labs ordered today. Will make recommendations based on lab results.       Relevant Medications   lisinopril-hydrochlorothiazide (ZESTORETIC) 20-12.5 MG tablet   rosuvastatin (CRESTOR) 10 MG tablet   Other Relevant Orders   Comp Met (CMET)   Lipid Profile   Major depression, recurrent, chronic (HCC)    Chronic. Ongoing.  Patient declines medication at this time.  Will call back if she feels like she needs medication.         Follow up plan: Return in about 6 months (around 07/04/2021) for Physical and Fasting labs.

## 2021-01-03 NOTE — Assessment & Plan Note (Signed)
Chronic. Ongoing.  Patient declines medication at this time.  Will call back if she feels like she needs medication.

## 2021-01-03 NOTE — Assessment & Plan Note (Signed)
Chronic.  Controlled.  Continue with current medication regimen of Lisinpril 20mg  and HCTZ of 12.5mg .  Labs ordered today.  Return to clinic in 6 months for reevaluation.  Call sooner if concerns arise.

## 2021-01-04 ENCOUNTER — Ambulatory Visit: Payer: Medicare Other | Admitting: Nurse Practitioner

## 2021-01-04 LAB — COMPREHENSIVE METABOLIC PANEL
ALT: 15 IU/L (ref 0–32)
AST: 23 IU/L (ref 0–40)
Albumin/Globulin Ratio: 2 (ref 1.2–2.2)
Albumin: 4.8 g/dL (ref 3.8–4.8)
Alkaline Phosphatase: 76 IU/L (ref 44–121)
BUN/Creatinine Ratio: 10 — ABNORMAL LOW (ref 12–28)
BUN: 8 mg/dL (ref 8–27)
Bilirubin Total: 0.4 mg/dL (ref 0.0–1.2)
CO2: 26 mmol/L (ref 20–29)
Calcium: 10 mg/dL (ref 8.7–10.3)
Chloride: 97 mmol/L (ref 96–106)
Creatinine, Ser: 0.77 mg/dL (ref 0.57–1.00)
Globulin, Total: 2.4 g/dL (ref 1.5–4.5)
Glucose: 92 mg/dL (ref 70–99)
Potassium: 4.4 mmol/L (ref 3.5–5.2)
Sodium: 136 mmol/L (ref 134–144)
Total Protein: 7.2 g/dL (ref 6.0–8.5)
eGFR: 84 mL/min/{1.73_m2} (ref >59)

## 2021-01-04 LAB — LIPID PANEL
Chol/HDL Ratio: 2.7 ratio (ref 0.0–4.4)
Cholesterol, Total: 185 mg/dL (ref 100–199)
HDL: 68 mg/dL (ref >39)
LDL Chol Calc (NIH): 106 mg/dL — ABNORMAL HIGH (ref 0–99)
Triglycerides: 55 mg/dL (ref 0–149)
VLDL Cholesterol Cal: 11 mg/dL (ref 5–40)

## 2021-01-04 NOTE — Progress Notes (Signed)
Please let patient know that her lab work looks good.  Cholesterol has improved from prior but still slightly elevated.  I think increasing the Crestor to 10mg  will improve this.  Please let me now if she has any questions.

## 2021-02-13 ENCOUNTER — Other Ambulatory Visit: Payer: Self-pay | Admitting: Nurse Practitioner

## 2021-02-14 NOTE — Telephone Encounter (Signed)
Requested medication (s) are due for refill today: NO  Requested medication (s) are on the active medication list: NO, dose inconsistent with current med list  Last refill:  11/16/20  Future visit scheduled: 07/04/21  Notes to clinic:   Dose inconsistent with current med list, please assess.    Requested Prescriptions  Pending Prescriptions Disp Refills   rosuvastatin (CRESTOR) 5 MG tablet [Pharmacy Med Name: ROSUVASTATIN 5MG  TABLETS] 90 tablet 1    Sig: TAKE 1 TABLET(5 MG) BY MOUTH DAILY     Cardiovascular:  Antilipid - Statins Failed - 02/13/2021  4:18 PM      Failed - LDL in normal range and within 360 days    LDL Chol Calc (NIH)  Date Value Ref Range Status  01/03/2021 106 (H) 0 - 99 mg/dL Final          Passed - Total Cholesterol in normal range and within 360 days    Cholesterol, Total  Date Value Ref Range Status  01/03/2021 185 100 - 199 mg/dL Final          Passed - HDL in normal range and within 360 days    HDL  Date Value Ref Range Status  01/03/2021 68 >39 mg/dL Final          Passed - Triglycerides in normal range and within 360 days    Triglycerides  Date Value Ref Range Status  01/03/2021 55 0 - 149 mg/dL Final          Passed - Patient is not pregnant      Passed - Valid encounter within last 12 months    Recent Outpatient Visits           1 month ago Essential hypertension   [facility] 01/05/2021, NP   7 months ago Annual physical exam   [facility], NP   9 months ago Major depression, recurrent, chronic (HCC)   [facility], NP   11 months ago Major depression, recurrent, chronic (HCC)   [facility] [place], [place] T, NP   1 year ago Nonintractable episodic headache, unspecified headache type   [facility], NP       Future Appointments             In 4 months Gabriel Cirri, NP [facility]    In 10 months  [facility]

## 2021-02-14 NOTE — Telephone Encounter (Signed)
Requested Prescriptions  Pending Prescriptions Disp Refills  . ASPIRIN LOW DOSE 81 MG EC tablet [Pharmacy Med Name: ASPIRIN 81MG  EC LOW DOSE TABLETS] 90 tablet 1    Sig: TAKE 1 TABLET(81 MG) BY MOUTH DAILY     Analgesics:  NSAIDS - aspirin Passed - 02/13/2021  4:18 PM      Passed - Patient is not pregnant      Passed - Valid encounter within last 12 months    Recent Outpatient Visits          1 month ago Essential hypertension   [facility] 14/07/2020, NP   7 months ago Annual physical exam   [facility], NP   9 months ago Major depression, recurrent, chronic (HCC)   [facility], NP   11 months ago Major depression, recurrent, chronic (HCC)   [facility] [place], [place] T, NP   1 year ago Nonintractable episodic headache, unspecified headache type   [facility], NP      Future Appointments            In 4 months Gabriel Cirri, NP [facility]   In 10 months  [facility]

## 2021-04-26 ENCOUNTER — Other Ambulatory Visit: Payer: Self-pay

## 2021-04-26 ENCOUNTER — Encounter: Payer: Self-pay | Admitting: Nurse Practitioner

## 2021-04-26 ENCOUNTER — Ambulatory Visit (INDEPENDENT_AMBULATORY_CARE_PROVIDER_SITE_OTHER): Payer: Medicare Other | Admitting: Nurse Practitioner

## 2021-04-26 VITALS — BP 148/77 | HR 59 | Temp 97.9°F | Wt 115.4 lb

## 2021-04-26 DIAGNOSIS — F339 Major depressive disorder, recurrent, unspecified: Secondary | ICD-10-CM

## 2021-04-26 DIAGNOSIS — M5430 Sciatica, unspecified side: Secondary | ICD-10-CM | POA: Diagnosis not present

## 2021-04-26 MED ORDER — VENLAFAXINE HCL ER 37.5 MG PO CP24: 37.5000 mg | ORAL_CAPSULE | Freq: Every day | ORAL | 0 refills | Status: DC

## 2021-04-26 MED ORDER — PREDNISONE 10 MG PO TABS: 10.0000 mg | ORAL_TABLET | Freq: Every day | ORAL | 0 refills | Status: DC

## 2021-04-26 NOTE — Progress Notes (Signed)
BP (!) 148/77    Pulse (!) 59    Temp 97.9 F (36.6 C) (Oral)    Wt 115 lb 6.4 oz (52.3 kg)    SpO2 98%    BMI 21.80 kg/m    Subjective:    Patient ID: Eileen Stewart, female    DOB: August 30, 1950, 71 y.o.   MRN: [id number]  HPI: Yasira Engelson is a 71 y.o. female  Chief Complaint  Patient presents with   Depression    Pt states she would like to discuss starting back on medication   Leg Pain    Pt states she has been issues with occasional pains that start in her buttocks and run down into her legs   DEPRESSION Patient states she would like to go back on her medication.  She states when she comes off the medication she feels worse.  She had a death in the family in 2022/11/24 and feels like it has worsened since then.  She has tried to find a Social worker but it hasn't panned out yet.  She feels like the Effexor was the more beneficial for her when she was taking.   [facility] Visit from 04/26/2021 in [place]  PHQ-9 Total Score 24      GAD 7 : Generalized Anxiety Score 04/26/2021 02/29/2020 03/27/2019 08/01/2018  Nervous, Anxious, on Edge 3 0 2 3  Control/stop worrying 3 0 0 3  Worry too much - different things 3 0 0 3  Trouble relaxing 1 0 0 1  Restless 3 0 0 2  Easily annoyed or irritable 3 0 2 3  Afraid - awful might happen 0 0 0 0  Total GAD 7 Score 16 0 4 15  Anxiety Difficulty Extremely difficult - Somewhat difficult Extremely difficult   LEG PAIN Duration: months Mechanism of injury: no trauma Location: Right leg Onset: sudden Severity: moderate Quality: aching Frequency: intermittent Radiation: buttocks and R leg above the knee Aggravating factors: prolonged sitting Alleviating factors: nothing Status: worse Treatments attempted: aleve  Relief with NSAIDs?: mild Nighttime pain:  no Paresthesias / decreased sensation:  yes Bowel / bladder incontinence:  no Fevers:  no Dysuria / urinary frequency:  no   Relevant past medical, surgical,  family and social history reviewed and updated as indicated. Interim medical history since our last visit reviewed. Allergies and medications reviewed and updated.  Review of Systems  Musculoskeletal:        Leg pain  Psychiatric/Behavioral:  Positive for dysphoric mood. Negative for suicidal ideas. The patient is nervous/anxious.    Per HPI unless specifically indicated above     Objective:    BP (!) 148/77    Pulse (!) 59    Temp 97.9 F (36.6 C) (Oral)    Wt 115 lb 6.4 oz (52.3 kg)    SpO2 98%    BMI 21.80 kg/m   Wt Readings from Last 3 Encounters:  04/26/21 115 lb 6.4 oz (52.3 kg)  01/03/21 116 lb (52.6 kg)  01/02/21 116 lb (52.6 kg)    Physical Exam Vitals and nursing note reviewed.  Constitutional:      General: She is not in acute distress.    Appearance: Normal appearance. She is normal weight. She is not ill-appearing, toxic-appearing or diaphoretic.  HENT:     Head: Normocephalic.     Right Ear: External ear normal.     Left Ear: External ear normal.     Nose: Nose normal.  Mouth/Throat:     Mouth: Mucous membranes are moist.     Pharynx: Oropharynx is clear.  Eyes:     General:        Right eye: No discharge.        Left eye: No discharge.     Extraocular Movements: Extraocular movements intact.     Conjunctiva/sclera: Conjunctivae normal.     Pupils: Pupils are equal, round, and reactive to light.  Cardiovascular:     Rate and Rhythm: Normal rate and regular rhythm.     Heart sounds: No murmur heard. Pulmonary:     Effort: Pulmonary effort is normal. No respiratory distress.     Breath sounds: Normal breath sounds. No wheezing or rales.  Musculoskeletal:        General: No swelling, tenderness, deformity or signs of injury. Normal range of motion.     Cervical back: Normal range of motion and neck supple.     Right lower leg: No edema.     Left lower leg: No edema.  Skin:    General: Skin is warm and dry.     Capillary Refill: Capillary refill  takes less than 2 seconds.  Neurological:     General: No focal deficit present.     Mental Status: She is alert and oriented to person, place, and time. Mental status is at baseline.  Psychiatric:        Mood and Affect: Mood normal.        Behavior: Behavior normal.        Thought Content: Thought content normal.        Judgment: Judgment normal.    Results for orders placed or performed in visit on 01/03/21  Comp Met (CMET)  Result Value Ref Range   Glucose 92 70 - 99 mg/dL   BUN 8 8 - 27 mg/dL   Creatinine, Ser 0.77 0.57 - 1.00 mg/dL   eGFR 84 >59 mL/min/1.73   BUN/Creatinine Ratio 10 (L) 12 - 28   Sodium 136 134 - 144 mmol/L   Potassium 4.4 3.5 - 5.2 mmol/L   Chloride 97 96 - 106 mmol/L   CO2 26 20 - 29 mmol/L   Calcium 10.0 8.7 - 10.3 mg/dL   Total Protein 7.2 6.0 - 8.5 g/dL   Albumin 4.8 3.8 - 4.8 g/dL   Globulin, Total 2.4 1.5 - 4.5 g/dL   Albumin/Globulin Ratio 2.0 1.2 - 2.2   Bilirubin Total 0.4 0.0 - 1.2 mg/dL   Alkaline Phosphatase 76 44 - 121 IU/L   AST 23 0 - 40 IU/L   ALT 15 0 - 32 IU/L  Lipid Profile  Result Value Ref Range   Cholesterol, Total 185 100 - 199 mg/dL   Triglycerides 55 0 - 149 mg/dL   HDL 68 >39 mg/dL   VLDL Cholesterol Cal 11 5 - 40 mg/dL   LDL Chol Calc (NIH) 106 (H) 0 - 99 mg/dL   Chol/HDL Ratio 2.7 0.0 - 4.4 ratio      Assessment & Plan:   Problem List Items Addressed This Visit       Other   Major depression, recurrent, chronic (HCC) - Primary    Chronic. Not well controlled.  Patient would like to go back on Effexor.  Will start at 37.12m and titrate up as needed.  Patient is working to get into counseling.  Discussed side effects and benefits of starting medication again.  Follow up in 1 month for reevaluation.  Relevant Medications   venlafaxine XR (EFFEXOR XR) 37.5 MG 24 hr capsule   Other Visit Diagnoses     Sciatic leg pain       Ongoing x 2 months. Will give prednisone and information on stretches. Discussed suing  heating pad and aleve TID for the next 3 days. FU if symptoms worsen.   Relevant Medications   venlafaxine XR (EFFEXOR XR) 37.5 MG 24 hr capsule        Follow up plan: Return in about 1 month (around 05/24/2021) for Depression/Anxiety FU.

## 2021-04-26 NOTE — Assessment & Plan Note (Signed)
Chronic. Not well controlled.  Patient would like to go back on Effexor.  Will start at 37.5mg  and titrate up as needed.  Patient is working to get into counseling.  Discussed side effects and benefits of starting medication again.  Follow up in 1 month for reevaluation.

## 2021-04-26 NOTE — Patient Instructions (Signed)
Complete course of prednisone Take Aleve 2 tabs three times daily for the next 3 days Perform stretches twice daily Use heating pad

## 2021-05-24 ENCOUNTER — Telehealth (INDEPENDENT_AMBULATORY_CARE_PROVIDER_SITE_OTHER): Payer: Medicare Other | Admitting: Nurse Practitioner

## 2021-05-24 ENCOUNTER — Encounter: Payer: Self-pay | Admitting: Nurse Practitioner

## 2021-05-24 ENCOUNTER — Ambulatory Visit: Payer: Medicare Other | Admitting: Nurse Practitioner

## 2021-05-24 DIAGNOSIS — F339 Major depressive disorder, recurrent, unspecified: Secondary | ICD-10-CM

## 2021-05-24 MED ORDER — VENLAFAXINE HCL ER 75 MG PO CP24: 75.0000 mg | ORAL_CAPSULE | Freq: Every day | ORAL | 0 refills | Status: DC

## 2021-05-24 MED ORDER — ROSUVASTATIN CALCIUM 10 MG PO TABS: 10.0000 mg | ORAL_TABLET | Freq: Every day | ORAL | 1 refills | Status: DC

## 2021-05-24 MED ORDER — LISINOPRIL-HYDROCHLOROTHIAZIDE 20-12.5 MG PO TABS: 1.0000 | ORAL_TABLET | Freq: Every day | ORAL | 1 refills | Status: DC

## 2021-05-24 MED ORDER — OXYBUTYNIN CHLORIDE ER 10 MG PO TB24: 10.0000 mg | ORAL_TABLET | Freq: Every day | ORAL | 1 refills | Status: DC

## 2021-05-24 NOTE — Progress Notes (Signed)
? ?There were no vitals taken for this visit.  ? ?Subjective:  ? ? Patient ID: Eileen Stewart, female    DOB: [birth year], 71 y.o.   MRN: [id number] ? ?HPI: ?Eileen Stewart is a 71 y.o. female ? ?Chief Complaint  ?Patient presents with  ? Depression  ?  Patient states she is doing well on the effexor   ? ?DEPRESSION ?Patient states she is doing well back on the Effexor.  She is currently on Effexor 37.93m and would like to increase the dose.  Patient states she is having some good days and the next day is a bad day.  States her symptoms are still rough and still lacks motivation to get things done.   ? ? ?Flowsheet Row Video Visit from 05/24/2021 in [place] ?PHQ-9 Total Score 10  ? ?  ? ?GAD 7 : Generalized Anxiety Score 05/24/2021 04/26/2021 02/29/2020 03/27/2019  ?Nervous, Anxious, on Edge 2 3 0 2  ?Control/stop worrying 2 3 0 0  ?Worry too much - different things 2 3 0 0  ?Trouble relaxing 0 1 0 0  ?Restless 2 3 0 0  ?Easily annoyed or irritable 2 3 0 2  ?Afraid - awful might happen 0 0 0 0  ?Total GAD 7 Score 10 16 0 4  ?Anxiety Difficulty - Extremely difficult - Somewhat difficult  ? ? ? ?Relevant past medical, surgical, family and social history reviewed and updated as indicated. Interim medical history since our last visit reviewed. ?Allergies and medications reviewed and updated. ? ?Review of Systems  ?Musculoskeletal:   ?     Leg pain  ?Psychiatric/Behavioral:  Positive for dysphoric mood. Negative for suicidal ideas. The patient is nervous/anxious.   ? ?Per HPI unless specifically indicated above ? ?   ?Objective:  ?  ?There were no vitals taken for this visit.  ?Wt Readings from Last 3 Encounters:  ?04/26/21 115 lb 6.4 oz (52.3 kg)  ?01/03/21 116 lb (52.6 kg)  ?01/02/21 116 lb (52.6 kg)  ?  ?Physical Exam ?Vitals and nursing note reviewed.  ?Pulmonary:  ?   Effort: Pulmonary effort is normal. No respiratory distress.  ?Neurological:  ?   Mental Status: She is alert.  ?Psychiatric:     ?    Mood and Affect: Mood normal.     ?   Behavior: Behavior normal.     ?   Thought Content: Thought content normal.     ?   Judgment: Judgment normal.  ? ? ?Results for orders placed or performed in visit on 01/03/21  ?Comp Met (CMET)  ?Result Value Ref Range  ? Glucose 92 70 - 99 mg/dL  ? Stewart 8 8 - 27 mg/dL  ? Creatinine, Ser 0.77 0.57 - 1.00 mg/dL  ? eGFR 84 >59 mL/min/1.73  ? Stewart/Creatinine Ratio 10 (L) 12 - 28  ? Sodium 136 134 - 144 mmol/L  ? Potassium 4.4 3.5 - 5.2 mmol/L  ? Chloride 97 96 - 106 mmol/L  ? CO2 26 20 - 29 mmol/L  ? Calcium 10.0 8.7 - 10.3 mg/dL  ? Total Protein 7.2 6.0 - 8.5 g/dL  ? Albumin 4.8 3.8 - 4.8 g/dL  ? Globulin, Total 2.4 1.5 - 4.5 g/dL  ? Albumin/Globulin Ratio 2.0 1.2 - 2.2  ? Bilirubin Total 0.4 0.0 - 1.2 mg/dL  ? Alkaline Phosphatase 76 44 - 121 IU/L  ? AST 23 0 - 40 IU/L  ? ALT 15 0 - 32 IU/L  ?Lipid  Profile  ?Result Value Ref Range  ? Cholesterol, Total 185 100 - 199 mg/dL  ? Triglycerides 55 0 - 149 mg/dL  ? HDL 68 >39 mg/dL  ? VLDL Cholesterol Cal 11 5 - 40 mg/dL  ? LDL Chol Calc (NIH) 106 (H) 0 - 99 mg/dL  ? Chol/HDL Ratio 2.7 0.0 - 4.4 ratio  ? ?   ?Assessment & Plan:  ? ?Problem List Items Addressed This Visit   ? ?  ? Other  ? Major depression, recurrent, chronic ([place]) - Primary  ?  Chronic. Improved but still not great.  Will increase Effexor to 35m daily.  Will follow up in 6 weeks.  Will increase medication if needed at that time.  Patient understands and agrees with the plan of care.  ?  ?  ? Relevant Medications  ? venlafaxine XR (EFFEXOR XR) 75 MG 24 hr capsule  ?  ? ?Follow up plan: ?Return in about 1 month (around 06/24/2021) for Depression/Anxiety FU. ? ? ?This visit was completed via MyChart due to the restrictions of the COVID-19 pandemic. All issues as above were discussed and addressed. Physical exam was done as above through visual confirmation on MyChart. If it was felt that the patient should be evaluated in the office, they were directed there. The patient  verbally consented to this visit. ?Location of the patient: Home ?Location of the provider: Office ?Those involved with this call:  ?Provider: KJon Billings NP ?CMA: CLouanna Raw CMA ?Front Desk/Registration: ILynnell Catalan?This encounter was conducted via Telephone.  I spent 21 dedicated to the care of this patient on the date of this encounter to include previsit review of symptoms and plan of care, face to face time with the patient, and post visit ordering of testing.  ? ? ? ? ?

## 2021-05-24 NOTE — Assessment & Plan Note (Signed)
Chronic. Improved but still not great.  Will increase Effexor to 75mg  daily.  Will follow up in 6 weeks.  Will increase medication if needed at that time.  Patient understands and agrees with the plan of care.  ?

## 2021-07-03 NOTE — Progress Notes (Signed)
? ?BP (!) 162/78   Pulse (!) 55   Temp 97.7 ?F (36.5 ?C) (Oral)   Ht 5\' 1"  (1.549 m)   Wt 115 lb (52.2 kg)   SpO2 97%   BMI 21.73 kg/m?   ? ?Subjective:  ? ? Patient ID: Eileen PartridgeMelissa Stewart, female    DOB: 05/25/1950, 71 y.o.   MRN: [id number] ? ?HPI: ?Eileen PartridgeMelissa Stewart is a 71 y.o. female presenting on 07/04/2021 for comprehensive medical examination. Current medical complaints include:none ? ?She currently lives with: ?Menopausal Symptoms: no ? ?HYPERTENSION / HYPERLIPIDEMIA ?Satisfied with current treatment? yes ?Duration of hypertension: years ?BP monitoring frequency: not checking ?BP range:  ?BP medication side effects: no ?Past BP meds: lisinopril-HCTZ ?Duration of hyperlipidemia: years ?Cholesterol medication side effects: no ?Cholesterol supplements: none ?Past cholesterol medications: rosuvastatin (crestor) ?Medication compliance: excellent compliance ?Aspirin: no ?Recent stressors: no ?Recurrent headaches: no ?Visual changes: no ?Palpitations: no ?Dyspnea: no ?Chest pain: no ?Lower extremity edema: no ?Dizzy/lightheaded: no ? ? ?DEPRESSION ?Patient states she feels like is doing better but not 100%.  She would like to increase the dose of her Effexor again.  She denies SI.  ? ?Depression Screen done today and results listed below:  ? ?  07/04/2021  ? 10:29 AM 05/24/2021  ? 10:53 AM 04/26/2021  ? 10:30 AM 01/02/2021  ?  3:36 PM 06/29/2020  ?  1:46 PM  ?Depression screen PHQ 2/9  ?Decreased Interest 1 2 3 2 1   ?Down, Depressed, Hopeless 0 2 3 3 1   ?PHQ - 2 Score 1 4 6 5 2   ?Altered sleeping 1 2 3 3 1   ?Tired, decreased energy 2 2 3  0 0  ?Change in appetite 1 0 3 0 1  ?Feeling bad or failure about yourself  0 0 3 0 0  ?Trouble concentrating 1 2 3 3 3   ?Moving slowly or fidgety/restless 0 0 3 0 0  ?Suicidal thoughts 0 0 0 0 0  ?PHQ-9 Score 6 10 24 11 7   ?Difficult doing work/chores Not difficult at all  Extremely dIfficult Somewhat difficult Not difficult at all  ? ? ?The patient does not have a history of falls. I  did complete a risk assessment for falls. A plan of care for falls was documented. ? ? ?Past Medical History:  ?Past Medical History:  ?Diagnosis Date  ? Acute pyelonephritis   ? Alcohol abuse   ? Alcohol, remission since 2013  ? Anemia   ? Asymptomatic varicose veins, unspecified laterality   ? Depression   ? Essential tremor   ? Hyperlipidemia   ? Hypertension   ? Liver mass   ? Vitamin D deficiency   ? ? ?Surgical History:  ?Past Surgical History:  ?Procedure Laterality Date  ? SPLENECTOMY    ? ? ?Medications:  ?Current Outpatient Medications on File Prior to Visit  ?Medication Sig  ? ASPIRIN LOW DOSE 81 MG EC tablet TAKE 1 TABLET(81 MG) BY MOUTH DAILY  ? oxybutynin (DITROPAN-XL) 10 MG 24 hr tablet Take 1 tablet (10 mg total) by mouth at bedtime.  ? rosuvastatin (CRESTOR) 10 MG tablet Take 1 tablet (10 mg total) by mouth daily.  ? ?No current facility-administered medications on file prior to visit.  ? ? ?Allergies:  ?No Known Allergies ? ?Social History:  ?Social History  ? ?Socioeconomic History  ? Marital status: Divorced  ?  Spouse name: Not on file  ? Number of children: Not on file  ? Years of education: Not on file  ?  Highest education level: Associate degree: academic program  ?Occupational History  ? Occupation: retired  ?Tobacco Use  ? Smoking status: Never  ? Smokeless tobacco: Never  ?Vaping Use  ? Vaping Use: Never used  ?Substance and Sexual Activity  ? Alcohol use: Yes  ?  Comment: 3 beers a month on average   ? Drug use: No  ? Sexual activity: Not Currently  ?Other Topics Concern  ? Not on file  ?Social History Narrative  ? Not on file  ? ?Social Determinants of Health  ? ?Financial Resource Strain: Low Risk   ? Difficulty of Paying Living Expenses: Not hard at all  ?Food Insecurity: No Food Insecurity  ? Worried About Programme researcher, broadcasting/film/video in the Last Year: Never true  ? Ran Out of Food in the Last Year: Never true  ?Transportation Needs: No Transportation Needs  ? Lack of Transportation (Medical):  No  ? Lack of Transportation (Non-Medical): No  ?Physical Activity: Inactive  ? Days of Exercise per Week: 0 days  ? Minutes of Exercise per Session: 0 min  ?Stress: No Stress Concern Present  ? Feeling of Stress : Not at all  ?Social Connections: Not on file  ?Intimate Partner Violence: Not on file  ? ?Social History  ? ?Tobacco Use  ?Smoking Status Never  ?Smokeless Tobacco Never  ? ?Social History  ? ?Substance and Sexual Activity  ?Alcohol Use Yes  ? Comment: 3 beers a month on average   ? ? ?Family History:  ?Family History  ?Problem Relation Age of Onset  ? Hyperlipidemia Mother   ? Hypertension Mother   ? Alcohol abuse Father   ? Depression Sister   ? Anxiety disorder Sister   ? Parkinson's disease Cousin   ? ? ?Past medical history, surgical history, medications, allergies, family history and social history reviewed with patient today and changes made to appropriate areas of the chart.  ? ?Review of Systems  ?Eyes:  Negative for blurred vision and double vision.  ?Respiratory:  Negative for shortness of breath.   ?Cardiovascular:  Negative for chest pain, palpitations and leg swelling.  ?Musculoskeletal:  Positive for myalgias.  ?Neurological:  Negative for dizziness and headaches.  ?Psychiatric/Behavioral:  Positive for depression. Negative for suicidal ideas. The patient is nervous/anxious.   ?All other ROS negative except what is listed above and in the HPI.  ? ?   ?Objective:  ?  ?BP (!) 162/78   Pulse (!) 55   Temp 97.7 ?F (36.5 ?C) (Oral)   Ht 5\' 1"  (1.549 m)   Wt 115 lb (52.2 kg)   SpO2 97%   BMI 21.73 kg/m?   ?Wt Readings from Last 3 Encounters:  ?07/04/21 115 lb (52.2 kg)  ?04/26/21 115 lb 6.4 oz (52.3 kg)  ?01/03/21 116 lb (52.6 kg)  ?  ?Physical Exam ?Vitals and nursing note reviewed.  ?Constitutional:   ?   General: She is awake. She is not in acute distress. ?   Appearance: Normal appearance. She is well-developed and normal weight. She is not ill-appearing.  ?HENT:  ?   Head:  Normocephalic and atraumatic.  ?   Right Ear: Hearing, tympanic membrane, ear canal and external ear normal. No drainage.  ?   Left Ear: Hearing, tympanic membrane, ear canal and external ear normal. No drainage.  ?   Nose: Nose normal.  ?   Right Sinus: No maxillary sinus tenderness or frontal sinus tenderness.  ?   Left Sinus: No maxillary  sinus tenderness or frontal sinus tenderness.  ?   Mouth/Throat:  ?   Mouth: Mucous membranes are moist.  ?   Pharynx: Oropharynx is clear. Uvula midline. No pharyngeal swelling, oropharyngeal exudate or posterior oropharyngeal erythema.  ?Eyes:  ?   General: Lids are normal.     ?   Right eye: No discharge.     ?   Left eye: No discharge.  ?   Extraocular Movements: Extraocular movements intact.  ?   Conjunctiva/sclera: Conjunctivae normal.  ?   Pupils: Pupils are equal, round, and reactive to light.  ?   Visual Fields: Right eye visual fields normal and left eye visual fields normal.  ?Neck:  ?   Thyroid: No thyromegaly.  ?   Vascular: No carotid bruit.  ?   Trachea: Trachea normal.  ?Cardiovascular:  ?   Rate and Rhythm: Normal rate and regular rhythm.  ?   Heart sounds: Normal heart sounds. No murmur heard. ?  No gallop.  ?Pulmonary:  ?   Effort: Pulmonary effort is normal. No accessory muscle usage or respiratory distress.  ?   Breath sounds: Normal breath sounds.  ?Chest:  ?Breasts: ?   Right: Normal.  ?   Left: Normal.  ?Abdominal:  ?   General: Bowel sounds are normal.  ?   Palpations: Abdomen is soft. There is no hepatomegaly or splenomegaly.  ?   Tenderness: There is no abdominal tenderness.  ?Musculoskeletal:     ?   General: Normal range of motion.  ?   Cervical back: Normal range of motion and neck supple.  ?   Right lower leg: No edema.  ?   Left lower leg: No edema.  ?Lymphadenopathy:  ?   Head:  ?   Right side of head: No submental, submandibular, tonsillar, preauricular or posterior auricular adenopathy.  ?   Left side of head: No submental, submandibular,  tonsillar, preauricular or posterior auricular adenopathy.  ?   Cervical: No cervical adenopathy.  ?   Upper Body:  ?   Right upper body: No supraclavicular, axillary or pectoral adenopathy.  ?   Left upper body: No supraclavicu

## 2021-07-04 ENCOUNTER — Encounter: Payer: Self-pay | Admitting: Nurse Practitioner

## 2021-07-04 ENCOUNTER — Ambulatory Visit (INDEPENDENT_AMBULATORY_CARE_PROVIDER_SITE_OTHER): Payer: Medicare Other | Admitting: Nurse Practitioner

## 2021-07-04 VITALS — BP 162/78 | HR 55 | Temp 97.7°F | Ht 61.0 in | Wt 115.0 lb

## 2021-07-04 DIAGNOSIS — Z0000 Encounter for general adult medical examination without abnormal findings: Secondary | ICD-10-CM | POA: Diagnosis not present

## 2021-07-04 DIAGNOSIS — M79601 Pain in right arm: Secondary | ICD-10-CM | POA: Diagnosis not present

## 2021-07-04 DIAGNOSIS — F339 Major depressive disorder, recurrent, unspecified: Secondary | ICD-10-CM | POA: Diagnosis not present

## 2021-07-04 DIAGNOSIS — I10 Essential (primary) hypertension: Secondary | ICD-10-CM

## 2021-07-04 DIAGNOSIS — E782 Mixed hyperlipidemia: Secondary | ICD-10-CM

## 2021-07-04 DIAGNOSIS — M79602 Pain in left arm: Secondary | ICD-10-CM | POA: Diagnosis not present

## 2021-07-04 DIAGNOSIS — R8290 Unspecified abnormal findings in urine: Secondary | ICD-10-CM | POA: Diagnosis not present

## 2021-07-04 LAB — URINALYSIS, ROUTINE W REFLEX MICROSCOPIC
Bilirubin, UA: NEGATIVE
Glucose, UA: NEGATIVE
Ketones, UA: NEGATIVE
Nitrite, UA: NEGATIVE
Protein,UA: NEGATIVE
Specific Gravity, UA: 1.02 (ref 1.005–1.030)
Urobilinogen, Ur: 0.2 mg/dL (ref 0.2–1.0)
pH, UA: 6 (ref 5.0–7.5)

## 2021-07-04 LAB — MICROSCOPIC EXAMINATION

## 2021-07-04 MED ORDER — VENLAFAXINE HCL ER 150 MG PO CP24: 150.0000 mg | ORAL_CAPSULE | Freq: Every day | ORAL | 1 refills | Status: DC

## 2021-07-04 MED ORDER — HYDROCHLOROTHIAZIDE 12.5 MG PO CAPS: 12.5000 mg | ORAL_CAPSULE | Freq: Every day | ORAL | 1 refills | Status: DC

## 2021-07-04 MED ORDER — LISINOPRIL 40 MG PO TABS: 40.0000 mg | ORAL_TABLET | Freq: Every day | ORAL | 1 refills | Status: DC

## 2021-07-04 NOTE — Assessment & Plan Note (Signed)
Chronic. Will increase Crestor from 5mg to 10mg to increase benefits and reduce risk of heart attack and stroke.  Labs ordered today. Will make recommendations based on lab results.  

## 2021-07-04 NOTE — Assessment & Plan Note (Signed)
Chronic. Not well controlled.  Will increase Lisinopril from 20mg  to 40mg .  Continue with HCTZ 12.5mg .  Follow up in 1 month for reevaluation. ?

## 2021-07-04 NOTE — Assessment & Plan Note (Signed)
Chronic. Not well controlled. Will increase Effexor to 150mg  daily.  Follow up in 1 month for reevaluation.  ?

## 2021-07-04 NOTE — Addendum Note (Signed)
Addended by: Haskell Rihn on: 07/04/2021 01:39 PM ? ? Modules accepted: Orders ? ?

## 2021-07-05 LAB — CBC WITH DIFFERENTIAL/PLATELET
Basophils Absolute: 0 10*3/uL (ref 0.0–0.2)
Basos: 1 %
EOS (ABSOLUTE): 0.3 10*3/uL (ref 0.0–0.4)
Eos: 5 %
Hematocrit: 45.1 % (ref 34.0–46.6)
Hemoglobin: 15.1 g/dL (ref 11.1–15.9)
Immature Grans (Abs): 0 10*3/uL (ref 0.0–0.1)
Immature Granulocytes: 0 %
Lymphocytes Absolute: 1.3 10*3/uL (ref 0.7–3.1)
Lymphs: 23 %
MCH: 29.8 pg (ref 26.6–33.0)
MCHC: 33.5 g/dL (ref 31.5–35.7)
MCV: 89 fL (ref 79–97)
Monocytes Absolute: 0.5 10*3/uL (ref 0.1–0.9)
Monocytes: 9 %
Neutrophils Absolute: 3.6 10*3/uL (ref 1.4–7.0)
Neutrophils: 62 %
Platelets: 335 10*3/uL (ref 150–450)
RBC: 5.07 x10E6/uL (ref 3.77–5.28)
RDW: 12.5 % (ref 11.7–15.4)
WBC: 5.6 10*3/uL (ref 3.4–10.8)

## 2021-07-05 LAB — LIPID PANEL
Chol/HDL Ratio: 1.9 ratio (ref 0.0–4.4)
Cholesterol, Total: 161 mg/dL (ref 100–199)
HDL: 83 mg/dL (ref >39)
LDL Chol Calc (NIH): 62 mg/dL (ref 0–99)
Triglycerides: 84 mg/dL (ref 0–149)
VLDL Cholesterol Cal: 16 mg/dL (ref 5–40)

## 2021-07-05 LAB — COMPREHENSIVE METABOLIC PANEL
ALT: 17 IU/L (ref 0–32)
AST: 27 IU/L (ref 0–40)
Albumin/Globulin Ratio: 2.2 (ref 1.2–2.2)
Albumin: 4.8 g/dL (ref 3.8–4.8)
Alkaline Phosphatase: 69 IU/L (ref 44–121)
BUN/Creatinine Ratio: 13 (ref 12–28)
BUN: 9 mg/dL (ref 8–27)
Bilirubin Total: 0.6 mg/dL (ref 0.0–1.2)
CO2: 23 mmol/L (ref 20–29)
Calcium: 9.8 mg/dL (ref 8.7–10.3)
Chloride: 97 mmol/L (ref 96–106)
Creatinine, Ser: 0.72 mg/dL (ref 0.57–1.00)
Globulin, Total: 2.2 g/dL (ref 1.5–4.5)
Glucose: 78 mg/dL (ref 70–99)
Potassium: 4.2 mmol/L (ref 3.5–5.2)
Sodium: 137 mmol/L (ref 134–144)
Total Protein: 7 g/dL (ref 6.0–8.5)
eGFR: 91 mL/min/{1.73_m2} (ref >59)

## 2021-07-05 LAB — VITAMIN D 25 HYDROXY (VIT D DEFICIENCY, FRACTURES): Vit D, 25-Hydroxy: 37.3 ng/mL (ref 30.0–100.0)

## 2021-07-05 LAB — TSH: TSH: 2.75 u[IU]/mL (ref 0.450–4.500)

## 2021-07-05 LAB — C-REACTIVE PROTEIN: CRP: <1 mg/L (ref 0–10)

## 2021-07-05 NOTE — Progress Notes (Signed)
Please let patient know that her lab work looks good.  Her inflammation markers were normal.  She should continue with her current medication regimen.  No concerns at this time.  Her urine was slightly abnormal so I have sent it for further testing to see if she needs antibiotic treatment.  We will follow up with her when that returns.  See her at our next visit.

## 2021-07-08 LAB — URINE CULTURE

## 2021-07-10 MED ORDER — SULFAMETHOXAZOLE-TRIMETHOPRIM 800-160 MG PO TABS: 1.0000 | ORAL_TABLET | Freq: Two times a day (BID) | ORAL | 0 refills | Status: DC

## 2021-07-10 NOTE — Addendum Note (Signed)
Addended by: Jon Billings on: 07/10/2021 08:27 AM ? ? Modules accepted: Orders ? ?

## 2021-07-10 NOTE — Progress Notes (Signed)
Please let patient know that her urine grew E Coli.  She will need antibiotic treatment for this.  I have send in bactrim twice daily for 7 days to treat this.  Please let me know if she has any questions.

## 2021-07-25 ENCOUNTER — Telehealth: Payer: Self-pay | Admitting: Nurse Practitioner

## 2021-07-25 NOTE — Telephone Encounter (Signed)
Copied from CRM [id number]. Topic: General - Other ?>> Jul 25, 2021  2:17 PM Pawlus, Maxine Glenn A wrote: ?Reason for CRM: Pt requested a call to go over her medications, pt stated Clydie Braun was supposed to change a few medications, please advise. ?

## 2021-07-26 ENCOUNTER — Other Ambulatory Visit: Payer: Self-pay | Admitting: Nurse Practitioner

## 2021-07-26 NOTE — Telephone Encounter (Signed)
Spoke with patient regarding medication, she just needed clarification on her most recent dose. Patient states she has had more incidents with her over active bladder, she is wondering if you need to increase her medication for that? She is also asking if she needs to keep her upcoming OV 08/03/21 or if she can do a virtual, I noticed she does not have mychart-unsure how that affects virtual visits but I told patient I will speak to you regardless and will f/up with her as soon as possible.  ?

## 2021-07-26 NOTE — Telephone Encounter (Signed)
Patient needs to be seen in office.  We can discuss her over active bladder at that time. ?

## 2021-07-26 NOTE — Telephone Encounter (Signed)
Spoke with patient to keep scheduled appointment with karen, pt voiced udnerstanding, no further questions at this time.  ?

## 2021-07-27 NOTE — Telephone Encounter (Signed)
The rx did not transmit due to being labeled "No Print".   Resent to [employer] as "Normal"  Requested Prescriptions  Pending Prescriptions Disp Refills  . venlafaxine XR (EFFEXOR-XR) 150 MG 24 hr capsule [Pharmacy Med Name: VENLAFAXINE ER 150MG  CAPSULES] 90 capsule 1    Sig: TAKE 1 CAPSULE(150 MG) BY MOUTH DAILY WITH BREAKFAST     Psychiatry: Antidepressants - SNRI - desvenlafaxine & venlafaxine Failed - 07/26/2021  1:59 PM      Failed - Last BP in normal range    BP Readings from Last 1 Encounters:  07/04/21 (!) 162/78         Failed - Lipid Panel in normal range within the last 12 months    Cholesterol, Total  Date Value Ref Range Status  07/04/2021 161 100 - 199 mg/dL Final   LDL Chol Calc (NIH)  Date Value Ref Range Status  07/04/2021 62 0 - 99 mg/dL Final   HDL  Date Value Ref Range Status  07/04/2021 83 >39 mg/dL Final   Triglycerides  Date Value Ref Range Status  07/04/2021 84 0 - 149 mg/dL Final         Passed - Cr in normal range and within 360 days    Creatinine, Ser  Date Value Ref Range Status  07/04/2021 0.72 0.57 - 1.00 mg/dL Final         Passed - Completed PHQ-2 or PHQ-9 in the last 360 days      Passed - Valid encounter within last 6 months    Recent Outpatient Visits          3 weeks ago Annual physical exam   [facility], NP   2 months ago Major depression, recurrent, chronic (HCC)   [facility], NP   3 months ago Major depression, recurrent, chronic (HCC)   [facility] Larae Grooms, NP   6 months ago Essential hypertension   [facility], NP   1 year ago Annual physical exam   [facility], NP      Future Appointments            In 1 week Larae Grooms, NP [facility]   In 5 months  [facility]

## 2021-08-02 NOTE — Progress Notes (Unsigned)
There were no vitals taken for this visit.   Subjective:    Patient ID: Eileen Stewart, female    DOB: [birth year], 71 y.o.   MRN: [id number]  HPI: Eileen Stewart is a 71 y.o. female  No chief complaint on file.  DEPRESSION Patient states she feels like is doing better but not 100%.  She would like to increase the dose of her Effexor again.  She denies SI.   Relevant past medical, surgical, family and social history reviewed and updated as indicated. Interim medical history since our last visit reviewed. Allergies and medications reviewed and updated.  Review of Systems  Per HPI unless specifically indicated above     Objective:    There were no vitals taken for this visit.  Wt Readings from Last 3 Encounters:  07/04/21 115 lb (52.2 kg)  04/26/21 115 lb 6.4 oz (52.3 kg)  01/03/21 116 lb (52.6 kg)    Physical Exam  Results for orders placed or performed in visit on 07/04/21  Microscopic Examination   Urine  Result Value Ref Range   WBC, UA 6-10 (A) 0 - 5 /hpf   RBC 0-2 0 - 2 /hpf   Epithelial Cells (non renal) 0-10 0 - 10 /hpf   Renal Epithel, UA 0-10 (A) None seen /hpf   Mucus, UA Present (A) Not Estab.   Bacteria, UA Moderate (A) None seen/Few  Urine Culture   Specimen: Urine   UR  Result Value Ref Range   Urine Culture, Routine Final report (A)    Organism ID, Bacteria Escherichia coli (A)    Antimicrobial Susceptibility Comment   CBC with Differential/Platelet  Result Value Ref Range   WBC 5.6 3.4 - 10.8 x10E3/uL   RBC 5.07 3.77 - 5.28 x10E6/uL   Hemoglobin 15.1 11.1 - 15.9 g/dL   Hematocrit 45.1 34.0 - 46.6 %   MCV 89 79 - 97 fL   MCH 29.8 26.6 - 33.0 pg   MCHC 33.5 31.5 - 35.7 g/dL   RDW 12.5 11.7 - 15.4 %   Platelets 335 150 - 450 x10E3/uL   Neutrophils 62 Not Estab. %   Lymphs 23 Not Estab. %   Monocytes 9 Not Estab. %   Eos 5 Not Estab. %   Basos 1 Not Estab. %   Neutrophils Absolute 3.6 1.4 - 7.0 x10E3/uL   Lymphocytes Absolute 1.3 0.7 -  3.1 x10E3/uL   Monocytes Absolute 0.5 0.1 - 0.9 x10E3/uL   EOS (ABSOLUTE) 0.3 0.0 - 0.4 x10E3/uL   Basophils Absolute 0.0 0.0 - 0.2 x10E3/uL   Immature Granulocytes 0 Not Estab. %   Immature Grans (Abs) 0.0 0.0 - 0.1 x10E3/uL  Comprehensive metabolic panel  Result Value Ref Range   Glucose 78 70 - 99 mg/dL   BUN 9 8 - 27 mg/dL   Creatinine, Ser 0.72 0.57 - 1.00 mg/dL   eGFR 91 >59 mL/min/1.73   BUN/Creatinine Ratio 13 12 - 28   Sodium 137 134 - 144 mmol/L   Potassium 4.2 3.5 - 5.2 mmol/L   Chloride 97 96 - 106 mmol/L   CO2 23 20 - 29 mmol/L   Calcium 9.8 8.7 - 10.3 mg/dL   Total Protein 7.0 6.0 - 8.5 g/dL   Albumin 4.8 3.8 - 4.8 g/dL   Globulin, Total 2.2 1.5 - 4.5 g/dL   Albumin/Globulin Ratio 2.2 1.2 - 2.2   Bilirubin Total 0.6 0.0 - 1.2 mg/dL   Alkaline Phosphatase 69 44 - 121 IU/L  AST 27 0 - 40 IU/L   ALT 17 0 - 32 IU/L  Lipid panel  Result Value Ref Range   Cholesterol, Total 161 100 - 199 mg/dL   Triglycerides 84 0 - 149 mg/dL   HDL 83 >39 mg/dL   VLDL Cholesterol Cal 16 5 - 40 mg/dL   LDL Chol Calc (NIH) 62 0 - 99 mg/dL   Chol/HDL Ratio 1.9 0.0 - 4.4 ratio  TSH  Result Value Ref Range   TSH 2.750 0.450 - 4.500 uIU/mL  Urinalysis, Routine w reflex microscopic  Result Value Ref Range   Specific Gravity, UA 1.020 1.005 - 1.030   pH, UA 6.0 5.0 - 7.5   Color, UA Yellow Yellow   Appearance Ur Clear Clear   Leukocytes,UA 2+ (A) Negative   Protein,UA Negative Negative/Trace   Glucose, UA Negative Negative   Ketones, UA Negative Negative   RBC, UA Trace (A) Negative   Bilirubin, UA Negative Negative   Urobilinogen, Ur 0.2 0.2 - 1.0 mg/dL   Nitrite, UA Negative Negative   Microscopic Examination See below:   C-reactive protein  Result Value Ref Range   CRP <1 0 - 10 mg/L  Vitamin D (25 hydroxy)  Result Value Ref Range   Vit D, 25-Hydroxy 37.3 30.0 - 100.0 ng/mL      Assessment & Plan:   Problem List Items Addressed This Visit   None    Follow up  plan: No follow-ups on file.      

## 2021-08-03 ENCOUNTER — Encounter: Payer: Self-pay | Admitting: Nurse Practitioner

## 2021-08-03 ENCOUNTER — Ambulatory Visit (INDEPENDENT_AMBULATORY_CARE_PROVIDER_SITE_OTHER): Payer: Medicare Other | Admitting: Nurse Practitioner

## 2021-08-03 VITALS — BP 130/82 | HR 63 | Temp 98.3°F | Wt 115.2 lb

## 2021-08-03 DIAGNOSIS — I10 Essential (primary) hypertension: Secondary | ICD-10-CM

## 2021-08-03 DIAGNOSIS — R8290 Unspecified abnormal findings in urine: Secondary | ICD-10-CM

## 2021-08-03 DIAGNOSIS — N39498 Other specified urinary incontinence: Secondary | ICD-10-CM | POA: Diagnosis not present

## 2021-08-03 DIAGNOSIS — G250 Essential tremor: Secondary | ICD-10-CM

## 2021-08-03 LAB — URINALYSIS, ROUTINE W REFLEX MICROSCOPIC
Bilirubin, UA: NEGATIVE
Glucose, UA: NEGATIVE
Ketones, UA: NEGATIVE
Nitrite, UA: NEGATIVE
Protein,UA: NEGATIVE
Specific Gravity, UA: 1.015 (ref 1.005–1.030)
Urobilinogen, Ur: 0.2 mg/dL (ref 0.2–1.0)
pH, UA: 7 (ref 5.0–7.5)

## 2021-08-03 LAB — MICROSCOPIC EXAMINATION: Bacteria, UA: NONE SEEN

## 2021-08-03 NOTE — Addendum Note (Signed)
Addended by: Larae Grooms on: 08/03/2021 12:01 PM   Modules accepted: Orders

## 2021-08-03 NOTE — Assessment & Plan Note (Addendum)
Chronic.  Controlled.  Continue with current medication regimen of Lisinopril 40mg  and HCTZ 12.5mg .  Return to clinic in 6 months for reevaluation.  Call sooner if concerns arise.

## 2021-08-03 NOTE — Assessment & Plan Note (Signed)
Chronic. Has worsened.  Makes it difficult for her to write.  Would like to see Neurology.

## 2021-08-06 LAB — URINE CULTURE

## 2021-08-08 NOTE — Progress Notes (Signed)
Please let patient know that her urine did not grow any bacteria.  No evidence of a UTI.

## 2021-08-11 ENCOUNTER — Telehealth: Payer: Self-pay | Admitting: Nurse Practitioner

## 2021-08-11 ENCOUNTER — Other Ambulatory Visit: Payer: Self-pay | Admitting: Nurse Practitioner

## 2021-08-11 MED ORDER — OXYBUTYNIN CHLORIDE ER 10 MG PO TB24
10.0000 mg | ORAL_TABLET | Freq: Two times a day (BID) | ORAL | 0 refills | Status: DC
Start: 2021-08-11 — End: 2021-08-11

## 2021-08-11 NOTE — Telephone Encounter (Signed)
Returned patient phone call, patient states she discussed with PCP about having more leaks, and urgency to go to the bathroom. Patient states she feels like her current dosage of Oxybutin is not as effective anymore. Patient states urine was tested for UTI, but was clear, so wanted to ask PCP should medication dosage be increased. Please advise.

## 2021-08-11 NOTE — Telephone Encounter (Signed)
Returned patient call, NA LVM making patient aware medication has been sent in.

## 2021-08-11 NOTE — Telephone Encounter (Signed)
Requested 90 day supply per insurance. Requested Prescriptions  Pending Prescriptions Disp Refills  . oxybutynin (DITROPAN-XL) 10 MG 24 hr tablet [Pharmacy Med Name: OXYBUTYNIN ER 10MG  TABLETS] 180 tablet 0    Sig: TAKE 1 TABLET(10 MG) BY MOUTH IN THE MORNING AND AT BEDTIME     Urology:  Bladder Agents Passed - 08/11/2021  2:15 PM      Passed - Valid encounter within last 12 months    Recent Outpatient Visits          1 week ago Benign essential tremor   [place], NP   1 month ago Annual physical exam   [place], NP   2 months ago Major depression, recurrent, chronic ([place])   [facility] Jon Billings, NP   3 months ago Major depression, recurrent, chronic ([place])   [place], Karen, NP   7 months ago Essential hypertension   [facility] Jon Billings, NP      Future Appointments            In 2 months Jon Billings, NP [facility], [place]   In 5 months  [employer], [place]

## 2021-08-11 NOTE — Telephone Encounter (Signed)
Copied from CRM [id number]. Topic: General - Other >> Aug 11, 2021 10:45 AM Pawlus, Maxine Glenn A wrote: Reason for CRM: Pt called in requesting to speak with Clydie Braun regarding her medication oxybutynin (DITROPAN-XL) 10 MG 24 hr tablet, pt was advised to call in to have the dose increased, please advise.

## 2021-08-11 NOTE — Telephone Encounter (Signed)
Dose increased to 10mg  twice daily.  I'd like her to follow up in 1 month for reevaluation.

## 2021-11-03 ENCOUNTER — Ambulatory Visit (INDEPENDENT_AMBULATORY_CARE_PROVIDER_SITE_OTHER): Payer: Medicare Other | Admitting: Nurse Practitioner

## 2021-11-03 ENCOUNTER — Encounter: Payer: Self-pay | Admitting: Nurse Practitioner

## 2021-11-03 DIAGNOSIS — F339 Major depressive disorder, recurrent, unspecified: Secondary | ICD-10-CM

## 2021-11-03 DIAGNOSIS — I10 Essential (primary) hypertension: Secondary | ICD-10-CM

## 2021-11-03 DIAGNOSIS — E782 Mixed hyperlipidemia: Secondary | ICD-10-CM

## 2021-11-03 NOTE — Progress Notes (Unsigned)
There were no vitals taken for this visit.   Subjective:    Patient ID: Eileen Stewart, female    DOB: 05-Mar-1951, 71 y.o.   MRN: [id number]  HPI: Eileen Stewart is a 71 y.o. female  Chief Complaint  Patient presents with   Hypertension   Hyperlipidemia   Diabetes    3 month follow up    HYPERTENSION / HYPERLIPIDEMIA Satisfied with current treatment? no Duration of hypertension: years BP monitoring frequency: not checking BP range:  BP medication side effects: no Past BP meds: lisinopril-HCTZ Duration of hyperlipidemia: years Cholesterol medication side effects: no Cholesterol supplements: none Past cholesterol medications: rosuvastatin (crestor) Medication compliance: excellent compliance Aspirin: yes Recent stressors: no Recurrent headaches: no Visual changes: no Palpitations: no Dyspnea: no Chest pain: no Lower extremity edema: no Dizzy/lightheaded: no  DEPRESSION Patient states she is not taking the Wellbutrin or Effexor.  Patient states she doesn't want to take the medication so she is working on it without medication.   Relevant past medical, surgical, family and social history reviewed and updated as indicated. Interim medical history since our last visit reviewed. Allergies and medications reviewed and updated.  Review of Systems  Eyes:  Negative for visual disturbance.  Respiratory:  Negative for cough, chest tightness and shortness of breath.   Cardiovascular:  Negative for chest pain, palpitations and leg swelling.  Neurological:  Negative for dizziness and headaches.    Per HPI unless specifically indicated above     Objective:    There were no vitals taken for this visit.  Wt Readings from Last 3 Encounters:  08/03/21 115 lb 3.2 oz (52.3 kg)  07/04/21 115 lb (52.2 kg)  04/26/21 115 lb 6.4 oz (52.3 kg)    Physical Exam Vitals and nursing note reviewed.  Constitutional:      General: She is not in acute distress.    Appearance: Normal  appearance. She is normal weight. She is not ill-appearing, toxic-appearing or diaphoretic.  HENT:     Head: Normocephalic.     Right Ear: External ear normal.     Left Ear: External ear normal.     Nose: Nose normal.     Mouth/Throat:     Mouth: Mucous membranes are moist.     Pharynx: Oropharynx is clear.  Eyes:     General:        Right eye: No discharge.        Left eye: No discharge.     Extraocular Movements: Extraocular movements intact.     Conjunctiva/sclera: Conjunctivae normal.     Pupils: Pupils are equal, round, and reactive to light.  Cardiovascular:     Rate and Rhythm: Normal rate and regular rhythm.     Heart sounds: No murmur heard. Pulmonary:     Effort: Pulmonary effort is normal. No respiratory distress.     Breath sounds: Normal breath sounds. No wheezing or rales.  Musculoskeletal:     Cervical back: Normal range of motion and neck supple.  Skin:    General: Skin is warm and dry.     Capillary Refill: Capillary refill takes less than 2 seconds.  Neurological:     General: No focal deficit present.     Mental Status: She is alert and oriented to person, place, and time. Mental status is at baseline.  Psychiatric:        Mood and Affect: Mood normal.        Behavior: Behavior normal.  Thought Content: Thought content normal.        Judgment: Judgment normal.     Results for orders placed or performed in visit on 08/03/21  Urine Culture   Specimen: Urine   UR  Result Value Ref Range   Urine Culture, Routine Final report    Organism ID, Bacteria Comment   Microscopic Examination   Urine  Result Value Ref Range   WBC, UA 0-5 0 - 5 /hpf   RBC, Urine 0-2 0 - 2 /hpf   Epithelial Cells (non renal) 0-10 0 - 10 /hpf   Bacteria, UA None seen None seen/Few  Urinalysis, Routine w reflex microscopic  Result Value Ref Range   Specific Gravity, UA 1.015 1.005 - 1.030   pH, UA 7.0 5.0 - 7.5   Color, UA Yellow Yellow   Appearance Ur Clear Clear    Leukocytes,UA Trace (A) Negative   Protein,UA Negative Negative/Trace   Glucose, UA Negative Negative   Ketones, UA Negative Negative   RBC, UA Trace (A) Negative   Bilirubin, UA Negative Negative   Urobilinogen, Ur 0.2 0.2 - 1.0 mg/dL   Nitrite, UA Negative Negative   Microscopic Examination See below:       Assessment & Plan:   Problem List Items Addressed This Visit   None    Follow up plan: No follow-ups on file.

## 2021-11-06 NOTE — Progress Notes (Signed)
Patient cancelled appt.

## 2021-11-23 ENCOUNTER — Other Ambulatory Visit: Payer: Self-pay | Admitting: Nurse Practitioner

## 2021-11-24 DIAGNOSIS — G250 Essential tremor: Secondary | ICD-10-CM | POA: Diagnosis not present

## 2021-11-24 DIAGNOSIS — F32A Depression, unspecified: Secondary | ICD-10-CM | POA: Diagnosis not present

## 2021-11-24 DIAGNOSIS — I10 Essential (primary) hypertension: Secondary | ICD-10-CM | POA: Diagnosis not present

## 2021-11-24 NOTE — Telephone Encounter (Signed)
Requested Prescriptions  Pending Prescriptions Disp Refills  . rosuvastatin (CRESTOR) 10 MG tablet [Pharmacy Med Name: ROSUVASTATIN 10MG  TABLETS] 90 tablet 1    Sig: TAKE 1 TABLET(10 MG) BY MOUTH DAILY     Cardiovascular:  Antilipid - Statins 2 Failed - 11/23/2021  2:17 PM      Failed - Lipid Panel in normal range within the last 12 months    Cholesterol, Total  Date Value Ref Range Status  07/04/2021 161 100 - 199 mg/dL Final   LDL Chol Calc (NIH)  Date Value Ref Range Status  07/04/2021 62 0 - 99 mg/dL Final   HDL  Date Value Ref Range Status  07/04/2021 83 >39 mg/dL Final   Triglycerides  Date Value Ref Range Status  07/04/2021 84 0 - 149 mg/dL Final         Passed - Cr in normal range and within 360 days    Creatinine, Ser  Date Value Ref Range Status  07/04/2021 0.72 0.57 - 1.00 mg/dL Final         Passed - Patient is not pregnant      Passed - Valid encounter within last 12 months    Recent Outpatient Visits          3 weeks ago Mixed hyperlipidemia   [facility], NP   3 months ago Benign essential tremor   [facility], NP   4 months ago Annual physical exam   [facility], NP   6 months ago Major depression, recurrent, chronic (HCC)   [facility], NP   7 months ago Major depression, recurrent, chronic (HCC)   [facility] Larae Grooms, NP      Future Appointments            In 1 month [facility]

## 2021-12-21 ENCOUNTER — Other Ambulatory Visit: Payer: Self-pay | Admitting: Student

## 2021-12-21 DIAGNOSIS — G20A1 Parkinson's disease without dyskinesia, without mention of fluctuations: Secondary | ICD-10-CM

## 2021-12-29 ENCOUNTER — Other Ambulatory Visit: Payer: Self-pay | Admitting: Nurse Practitioner

## 2021-12-29 NOTE — Telephone Encounter (Signed)
Requested Prescriptions  Pending Prescriptions Disp Refills  . lisinopril (ZESTRIL) 40 MG tablet [Pharmacy Med Name: LISINOPRIL 40MG  TABLETS] 90 tablet 1    Sig: TAKE 1 TABLET(40 MG) BY MOUTH DAILY     Cardiovascular:  ACE Inhibitors Passed - 12/29/2021  2:43 PM      Passed - Cr in normal range and within 180 days    Creatinine, Ser  Date Value Ref Range Status  07/04/2021 0.72 0.57 - 1.00 mg/dL Final         Passed - K in normal range and within 180 days    Potassium  Date Value Ref Range Status  07/04/2021 4.2 3.5 - 5.2 mmol/L Final         Passed - Patient is not pregnant      Passed - Last BP in normal range    BP Readings from Last 1 Encounters:  08/03/21 130/82         Passed - Valid encounter within last 6 months    Recent Outpatient Visits          1 month ago Mixed hyperlipidemia   [facility], NP   4 months ago Benign essential tremor   [facility], NP   5 months ago Annual physical exam   [facility], NP   7 months ago Major depression, recurrent, chronic (HCC)   [facility] Larae Grooms, NP   8 months ago Major depression, recurrent, chronic (HCC)   [facility] Larae Grooms, NP      Future Appointments            In 1 week [facility]            . hydrochlorothiazide (MICROZIDE) 12.5 MG capsule [Pharmacy Med Name: HYDROCHLOROTHIAZIDE 12.5MG  CAPSULES] 90 capsule 1    Sig: TAKE 1 CAPSULE(12.5 MG) BY MOUTH DAILY     Cardiovascular: Diuretics - Thiazide Passed - 12/29/2021  2:43 PM      Passed - Cr in normal range and within 180 days    Creatinine, Ser  Date Value Ref Range Status  07/04/2021 0.72 0.57 - 1.00 mg/dL Final         Passed - K in normal range and within 180 days    Potassium  Date Value Ref Range Status  07/04/2021 4.2 3.5 - 5.2 mmol/L Final         Passed - Na in normal range and  within 180 days    Sodium  Date Value Ref Range Status  07/04/2021 137 134 - 144 mmol/L Final         Passed - Last BP in normal range    BP Readings from Last 1 Encounters:  08/03/21 130/82         Passed - Valid encounter within last 6 months    Recent Outpatient Visits          1 month ago Mixed hyperlipidemia   [facility], NP   4 months ago Benign essential tremor   [facility], NP   5 months ago Annual physical exam   [facility], NP   7 months ago Major depression, recurrent, chronic (HCC)   [facility], NP   8 months ago Major depression, recurrent, chronic (HCC)   [facility] Larae Grooms, NP      Future Appointments  In 1 week [facility]

## 2022-01-05 ENCOUNTER — Ambulatory Visit: Payer: Medicare Other

## 2022-01-08 ENCOUNTER — Ambulatory Visit (INDEPENDENT_AMBULATORY_CARE_PROVIDER_SITE_OTHER): Payer: Medicare Other | Admitting: *Deleted

## 2022-01-08 ENCOUNTER — Ambulatory Visit
Admission: RE | Admit: 2022-01-08 | Discharge: 2022-01-08 | Disposition: A | Discharge status: Home / Self Care | Payer: Medicare Other | Source: Ambulatory Visit | Attending: Student | Admitting: Student

## 2022-01-08 DIAGNOSIS — G20A1 Parkinson's disease without dyskinesia, without mention of fluctuations: Secondary | ICD-10-CM

## 2022-01-08 DIAGNOSIS — G319 Degenerative disease of nervous system, unspecified: Secondary | ICD-10-CM | POA: Diagnosis not present

## 2022-01-08 DIAGNOSIS — I6782 Cerebral ischemia: Secondary | ICD-10-CM | POA: Diagnosis not present

## 2022-01-08 DIAGNOSIS — J341 Cyst and mucocele of nose and nasal sinus: Secondary | ICD-10-CM | POA: Diagnosis not present

## 2022-01-08 DIAGNOSIS — Z0000 Encounter for general adult medical examination without abnormal findings: Secondary | ICD-10-CM

## 2022-01-08 NOTE — Progress Notes (Signed)
Subjective:   Eileen Stewart is a 71 y.o. female who presents for Medicare Annual (Subsequent) preventive examination.  I connected with  Eileen Stewart on 01/08/22 by a telephone enabled telemedicine application and verified that I am speaking with the correct person using two identifiers.   I discussed the limitations of evaluation and management by telemedicine. The patient expressed understanding and agreed to proceed.  Patient location: home  Provider location: Tele-health-home    Review of Systems     Cardiac Risk Factors include: advanced age (>23men, >65 women);hypertension     Objective:    Today's Vitals   There is no height or weight on file to calculate BMI.     01/08/2022   12:44 PM 01/02/2021    3:35 PM 01/01/2020    3:19 PM 03/08/2019    8:45 AM 12/29/2018    9:46 AM 07/30/2017   11:45 AM  Advanced Directives  Does Patient Have a Medical Advance Directive? Yes Yes Yes Yes Yes Yes  Type of Paramedic of [place];Living will [place];Living will [place];Living will Living will;Healthcare Power of [place];Living will  Copy of [place] in Chart? No - copy requested No - copy requested No - copy requested  No - copy requested     Current Medications (verified) Outpatient Encounter Medications as of 01/08/2022  Medication Sig   ASPIRIN LOW DOSE 81 MG EC tablet TAKE 1 TABLET(81 MG) BY MOUTH DAILY   hydrochlorothiazide (MICROZIDE) 12.5 MG capsule TAKE 1 CAPSULE(12.5 MG) BY MOUTH DAILY   lisinopril (ZESTRIL) 40 MG tablet TAKE 1 TABLET(40 MG) BY MOUTH DAILY   oxybutynin (DITROPAN-XL) 10 MG 24 hr tablet TAKE 1 TABLET(10 MG) BY MOUTH IN THE MORNING AND AT BEDTIME   rosuvastatin (CRESTOR) 10 MG tablet TAKE 1 TABLET(10 MG) BY MOUTH DAILY   venlafaxine XR (EFFEXOR-XR) 150 MG 24 hr capsule TAKE 1 CAPSULE(150 MG) BY MOUTH DAILY  WITH BREAKFAST   No facility-administered encounter medications on file as of 01/08/2022.    Allergies (verified) Patient has no known allergies.   History: Past Medical History:  Diagnosis Date   Acute pyelonephritis    Alcohol abuse    Alcohol, remission since 2013   Anemia    Asymptomatic varicose veins, unspecified laterality    Depression    Essential tremor    Hyperlipidemia    Hypertension    Liver mass    Vitamin D deficiency    Past Surgical History:  Procedure Laterality Date   SPLENECTOMY     Family History  Problem Relation Age of Onset   Hyperlipidemia Mother    Hypertension Mother    Alcohol abuse Father    Depression Sister    Anxiety disorder Sister    Parkinson's disease Cousin    Social History   Socioeconomic History   Marital status: Divorced    Spouse name: Not on file   Number of children: Not on file   Years of education: Not on file   Highest education level: Associate degree: academic program  Occupational History   Occupation: retired  Tobacco Use   Smoking status: Never   Smokeless tobacco: Never  Vaping Use   Vaping Use: Never used  Substance and Sexual Activity   Alcohol use: Yes    Comment: 3 beers a month on average    Drug use: No   Sexual activity: Not Currently  Other Topics  Concern   Not on file  Social History Narrative   Not on file   Social Determinants of Health   Financial Resource Strain: Low Risk  (01/08/2022)   Overall Financial Resource Strain (CARDIA)    Difficulty of Paying Living Expenses: Not hard at all  Food Insecurity: No Food Insecurity (01/08/2022)   Hunger Vital Sign    Worried About Running Out of Food in the Last Year: Never true    Ran Out of Food in the Last Year: Never true  Transportation Needs: No Transportation Needs (01/08/2022)   PRAPARE - Administrator, Civil Service (Medical): No    Lack of Transportation (Non-Medical): No  Physical Activity: Inactive (01/08/2022)    Exercise Vital Sign    Days of Exercise per Week: 0 days    Minutes of Exercise per Session: 0 min  Stress: No Stress Concern Present (01/08/2022)   [employer] of Occupational Health - Occupational Stress Questionnaire    Feeling of Stress : Not at all  Social Connections: Moderately Integrated (01/08/2022)   Social Connection and Isolation Panel [NHANES]    Frequency of Communication with Friends and Family: Three times a week    Frequency of Social Gatherings with Friends and Family: Three times a week    Attends Religious Services: More than 4 times per year    Active Member of Clubs or Organizations: Yes    Attends Engineer, structural: More than 4 times per year    Marital Status: Divorced    Tobacco Counseling Counseling given: Not Answered   Clinical Intake:  Pre-visit preparation completed: Yes  Pain : No/denies pain     Diabetes: No  How often do you need to have someone help you when you read instructions, pamphlets, or other written materials from your doctor or pharmacy?: 1 - Never  Diabetic? no  Interpreter Needed?: No  Information entered by :: Remi Haggard LPN   Activities of Daily Living    01/08/2022   12:45 PM  In your present state of health, do you have any difficulty performing the following activities:  Hearing? 0  Vision? 0  Difficulty concentrating or making decisions? 0  Walking or climbing stairs? 0  Dressing or bathing? 0  Doing errands, shopping? 0  Preparing Food and eating ? N  Using the Toilet? N  In the past six months, have you accidently leaked urine? Y  Do you have problems with loss of bowel control? N  Managing your Medications? N  Managing your Finances? N  Housekeeping or managing your Housekeeping? N    Patient Care Team: Larae Grooms, NP as PCP - General  Indicate any recent Medical Services you may have received from other than [facility] providers in the past year (date may be approximate).      Assessment:   This is a routine wellness examination for Eileen Stewart.  Hearing/Vision screen Hearing Screening - Comments:: No trouble hearing Vision Screening - Comments:: Due for checkup [facility]  Dietary issues and exercise activities discussed: Current Exercise Habits: The patient does not participate in regular exercise at present   Goals Addressed             This Visit's Progress    Increase physical activity       Patient Stated   On track    01/01/2020, no goals     Patient Stated   Not on track    01/02/2021, wants to start walking  Depression Screen    01/08/2022   12:52 PM 08/03/2021   10:23 AM 07/04/2021   10:29 AM 05/24/2021   10:53 AM 04/26/2021   10:30 AM 01/02/2021    3:36 PM 06/29/2020    1:46 PM  PHQ 2/9 Scores  PHQ - 2 Score 1 0 1 4 6 5 2   PHQ- 9 Score 1 2 6 10 24 11 7     Fall Risk    01/08/2022   12:44 PM 08/03/2021   10:23 AM 07/04/2021   10:29 AM 04/26/2021   10:28 AM 01/02/2021    3:36 PM  Fall Risk   Falls in the past year? 0 0 0 0 0  Number falls in past yr: 0 0 0 0   Injury with Fall? 0 0 0 0   Risk for fall due to :  No Fall Risks No Fall Risks No Fall Risks Medication side effect  Follow up Falls evaluation completed;Education provided;Falls prevention discussed Falls evaluation completed Falls evaluation completed Falls evaluation completed Falls evaluation completed;Education provided;Falls prevention discussed    FALL RISK PREVENTION PERTAINING TO THE HOME:  Any stairs in or around the home? Yes  If so, are there any without handrails? No  Home free of loose throw rugs in walkways, pet beds, electrical cords, etc? Yes  Adequate lighting in your home to reduce risk of falls? Yes   ASSISTIVE DEVICES UTILIZED TO PREVENT FALLS:  Life alert? No  Use of a cane, walker or w/c? No  Grab bars in the bathroom? Yes  Shower chair or bench in shower? Yes  Elevated toilet seat or a handicapped toilet? No   TIMED UP AND  GO:  Was the test performed? No .    Cognitive Function:        01/08/2022   12:49 PM 01/02/2021    3:42 PM 01/01/2020    3:23 PM 12/23/2017   11:05 AM  6CIT Screen  What Year? 0 points 0 points 0 points 4 points  What month? 0 points 0 points 0 points 3 points  What time? 0 points 0 points 0 points 3 points  Count back from 20 0 points 0 points 0 points 4 points  Months in reverse 0 points 0 points 0 points 4 points  Repeat phrase 0 points 0 points 0 points 8 points  Total Score 0 points 0 points 0 points 26 points    Immunizations Immunization History  Administered Date(s) Administered   Influenza, High Dose Seasonal PF 11/20/2017   Influenza,inj,Quad PF,6+ Mos 11/14/2016   Influenza-Unspecified 11/14/2016   Meningococcal Conjugate 10/06/2015   Moderna SARS-COV2 Booster Vaccination 07/16/2020   PFIZER(Purple Top)SARS-COV-2 Vaccination 05/08/2019, 06/02/2019   PPD Test 12/24/2012   Pneumococcal Conjugate-13 11/14/2016, 06/29/2020   Pneumococcal Polysaccharide-23 10/08/2014   Td 06/28/2014   Tdap 03/08/2019    TDAP status: Up to date  Flu Vaccine status: Due, Education has been provided regarding the importance of this vaccine. Advised may receive this vaccine at local pharmacy or Health Dept. Aware to provide a copy of the vaccination record if obtained from local pharmacy or Health Dept. Verbalized acceptance and understanding.  Pneumococcal vaccine status: Up to date  Covid-19 vaccine status: Information provided on how to obtain vaccines.   Qualifies for Shingles Vaccine? Yes   Zostavax completed No   Shingrix Completed?: No.    Education has been provided regarding the importance of this vaccine. Patient has been advised to call insurance company to determine out of pocket  expense if they have not yet received this vaccine. Advised may also receive vaccine at local pharmacy or Health Dept. Verbalized acceptance and understanding.  Screening Tests Health  Maintenance  Topic Date Due   Pneumonia Vaccine 73+ Years old (3 - PPSV23 or PCV20) 06/29/2021   COVID-19 Vaccine (3 - Pfizer series) 01/24/2022 (Originally 09/10/2020)   Zoster Vaccines- Shingrix (1 of 2) 04/10/2022 (Originally 10/11/2002)   MAMMOGRAM  04/26/2022 (Originally 01/03/2018)   INFLUENZA VACCINE  06/10/2022 (Originally 10/10/2021)   DEXA SCAN  01/09/2023 (Originally 10/10/2017)   COLONOSCOPY (Pts 45-92yrs Insurance coverage will need to be confirmed)  01/09/2023 (Originally 10/10/1997)   Medicare Annual Wellness (AWV)  01/09/2023   TETANUS/TDAP  03/07/2029   Hepatitis C Screening  Completed   HPV VACCINES  Aged Out    Health Maintenance  Health Maintenance Due  Topic Date Due   Pneumonia Vaccine 44+ Years old (26 - PPSV23 or PCV20) 06/29/2021    Colonoscopy  declined    Education provided  Mammogram  declined    Education provided  Bone Density declined      Education provided  Lung Cancer Screening: (Low Dose CT Chest recommended if Age 6-80 years, 30 pack-year currently smoking OR have quit w/in 15years.) does not qualify.   Lung Cancer Screening Referral:   Additional Screening:  Hepatitis C Screening: does not qualify; Completed 2016  Vision Screening: Recommended annual ophthalmology exams for early detection of glaucoma and other disorders of the eye. Is the patient up to date with their annual eye exam?  No  Who is the provider or what is the name of the office in which the patient attends annual eye exams? My eye doctor If pt is not established with a provider, would they like to be referred to a provider to establish care? No .   Dental Screening: Recommended annual dental exams for proper oral hygiene  Community Resource Referral / Chronic Care Management: CRR required this visit?  No   CCM required this visit?  No      Plan:     I have personally reviewed and noted the following in the patient's chart:   Medical and social history Use of alcohol,  tobacco or illicit drugs  Current medications and supplements including opioid prescriptions. Patient is not currently taking opioid prescriptions. Functional ability and status Nutritional status Physical activity Advanced directives List of other physicians Hospitalizations, surgeries, and ER visits in previous 12 months Vitals Screenings to include cognitive, depression, and falls Referrals and appointments  In addition, I have reviewed and discussed with patient certain preventive protocols, quality metrics, and best practice recommendations. A written personalized care plan for preventive services as well as general preventive health recommendations were provided to patient.     Leroy Kennedy, LPN   16/03/930   Nurse Notes:

## 2022-01-08 NOTE — Patient Instructions (Signed)
Eileen Stewart , Thank you for taking time to come for your Medicare Wellness Visit. I appreciate your ongoing commitment to your health goals. Please review the following plan we discussed and let me know if I can assist you in the future.   These are the goals we discussed:  Goals      Increase physical activity     Patient Stated     01/01/2020, no goals     Patient Stated     01/02/2021, wants to start walking        This is a list of the screening recommended for you and due dates:  Health Maintenance  Topic Date Due   Pneumonia Vaccine (3 - PPSV23 or PCV20) 06/29/2021   COVID-19 Vaccine (3 - Pfizer series) 01/24/2022*   Zoster (Shingles) Vaccine (1 of 2) 04/10/2022*   Mammogram  04/26/2022*   Flu Shot  06/10/2022*   DEXA scan (bone density measurement)  01/09/2023*   Colon Cancer Screening  01/09/2023*   Medicare Annual Wellness Visit  01/09/2023   Tetanus Vaccine  03/07/2029   Hepatitis C Screening: USPSTF Recommendation to screen - Ages 18-79 yo.  Completed   HPV Vaccine  Aged Out  *Topic was postponed. The date shown is not the original due date.    Advanced directives: Education provided  Conditions/risks identified:      Preventive Care 39 Years and Older, Female Preventive care refers to lifestyle choices and visits with your health care provider that can promote health and wellness. What does preventive care include? A yearly physical exam. This is also called an annual well check. Dental exams once or twice a year. Routine eye exams. Ask your health care provider how often you should have your eyes checked. Personal lifestyle choices, including: Daily care of your teeth and gums. Regular physical activity. Eating a healthy diet. Avoiding tobacco and drug use. Limiting alcohol use. Practicing safe sex. Taking low-dose aspirin every day. Taking vitamin and mineral supplements as recommended by your health care provider. What happens during an annual  well check? The services and screenings done by your health care provider during your annual well check will depend on your age, overall health, lifestyle risk factors, and family history of disease. Counseling  Your health care provider may ask you questions about your: Alcohol use. Tobacco use. Drug use. Emotional well-being. Home and relationship well-being. Sexual activity. Eating habits. History of falls. Memory and ability to understand (cognition). Work and work Statistician. Reproductive health. Screening  You may have the following tests or measurements: Height, weight, and BMI. Blood pressure. Lipid and cholesterol levels. These may be checked every 5 years, or more frequently if you are over 4 years old. Skin check. Lung cancer screening. You may have this screening every year starting at age 69 if you have a 30-pack-year history of smoking and currently smoke or have quit within the past 15 years. Fecal occult blood test (FOBT) of the stool. You may have this test every year starting at age 27. Flexible sigmoidoscopy or colonoscopy. You may have a sigmoidoscopy every 5 years or a colonoscopy every 10 years starting at age 71. Hepatitis C blood test. Hepatitis B blood test. Sexually transmitted disease (STD) testing. Diabetes screening. This is done by checking your blood sugar (glucose) after you have not eaten for a while (fasting). You may have this done every 1-3 years. Bone density scan. This is done to screen for osteoporosis. You may have this done starting at age  65. Mammogram. This may be done every 1-2 years. Talk to your health care provider about how often you should have regular mammograms. Talk with your health care provider about your test results, treatment options, and if necessary, the need for more tests. Vaccines  Your health care provider may recommend certain vaccines, such as: Influenza vaccine. This is recommended every year. Tetanus, diphtheria,  and acellular pertussis (Tdap, Td) vaccine. You may need a Td booster every 10 years. Zoster vaccine. You may need this after age 2. Pneumococcal 13-valent conjugate (PCV13) vaccine. One dose is recommended after age 14. Pneumococcal polysaccharide (PPSV23) vaccine. One dose is recommended after age 78. Talk to your health care provider about which screenings and vaccines you need and how often you need them. This information is not intended to replace advice given to you by your health care provider. Make sure you discuss any questions you have with your health care provider. Document Released: 03/25/2015 Document Revised: 11/16/2015 Document Reviewed: 12/28/2014 Elsevier Interactive Patient Education  2017 [place] Prevention in the Home Falls can cause injuries. They can happen to people of all ages. There are many things you can do to make your home safe and to help prevent falls. What can I do on the outside of my home? Regularly fix the edges of walkways and driveways and fix any cracks. Remove anything that might make you trip as you walk through a door, such as a raised step or threshold. Trim any bushes or trees on the path to your home. Use bright outdoor lighting. Clear any walking paths of anything that might make someone trip, such as rocks or tools. Regularly check to see if handrails are loose or broken. Make sure that both sides of any steps have handrails. Any raised decks and porches should have guardrails on the edges. Have any leaves, snow, or ice cleared regularly. Use sand or salt on walking paths during winter. Clean up any spills in your garage right away. This includes oil or grease spills. What can I do in the bathroom? Use night lights. Install grab bars by the toilet and in the tub and shower. Do not use towel bars as grab bars. Use non-skid mats or decals in the tub or shower. If you need to sit down in the shower, use a plastic, non-slip  stool. Keep the floor dry. Clean up any water that spills on the floor as soon as it happens. Remove soap buildup in the tub or shower regularly. Attach bath mats securely with double-sided non-slip rug tape. Do not have throw rugs and other things on the floor that can make you trip. What can I do in the bedroom? Use night lights. Make sure that you have a light by your bed that is easy to reach. Do not use any sheets or blankets that are too big for your bed. They should not hang down onto the floor. Have a firm chair that has side arms. You can use this for support while you get dressed. Do not have throw rugs and other things on the floor that can make you trip. What can I do in the kitchen? Clean up any spills right away. Avoid walking on wet floors. Keep items that you use a lot in easy-to-reach places. If you need to reach something above you, use a strong step stool that has a grab bar. Keep electrical cords out of the way. Do not use floor polish or wax that makes floors slippery.  If you must use wax, use non-skid floor wax. Do not have throw rugs and other things on the floor that can make you trip. What can I do with my stairs? Do not leave any items on the stairs. Make sure that there are handrails on both sides of the stairs and use them. Fix handrails that are broken or loose. Make sure that handrails are as long as the stairways. Check any carpeting to make sure that it is firmly attached to the stairs. Fix any carpet that is loose or worn. Avoid having throw rugs at the top or bottom of the stairs. If you do have throw rugs, attach them to the floor with carpet tape. Make sure that you have a light switch at the top of the stairs and the bottom of the stairs. If you do not have them, ask someone to add them for you. What else can I do to help prevent falls? Wear shoes that: Do not have high heels. Have rubber bottoms. Are comfortable and fit you well. Are closed at the  toe. Do not wear sandals. If you use a stepladder: Make sure that it is fully opened. Do not climb a closed stepladder. Make sure that both sides of the stepladder are locked into place. Ask someone to hold it for you, if possible. Clearly mark and make sure that you can see: Any grab bars or handrails. First and last steps. Where the edge of each step is. Use tools that help you move around (mobility aids) if they are needed. These include: Canes. Walkers. Scooters. Crutches. Turn on the lights when you go into a dark area. Replace any light bulbs as soon as they burn out. Set up your furniture so you have a clear path. Avoid moving your furniture around. If any of your floors are uneven, fix them. If there are any pets around you, be aware of where they are. Review your medicines with your doctor. Some medicines can make you feel dizzy. This can increase your chance of falling. Ask your doctor what other things that you can do to help prevent falls. This information is not intended to replace advice given to you by your health care provider. Make sure you discuss any questions you have with your health care provider. Document Released: 12/23/2008 Document Revised: 08/04/2015 Document Reviewed: 04/02/2014 Elsevier Interactive Patient Education  2017 [employer].

## 2022-01-17 DIAGNOSIS — G250 Essential tremor: Secondary | ICD-10-CM | POA: Diagnosis not present

## 2022-01-17 DIAGNOSIS — F32A Depression, unspecified: Secondary | ICD-10-CM | POA: Diagnosis not present

## 2022-01-17 DIAGNOSIS — R200 Anesthesia of skin: Secondary | ICD-10-CM | POA: Diagnosis not present

## 2022-01-17 DIAGNOSIS — I10 Essential (primary) hypertension: Secondary | ICD-10-CM | POA: Diagnosis not present

## 2022-01-24 ENCOUNTER — Other Ambulatory Visit: Payer: Self-pay | Admitting: Nurse Practitioner

## 2022-01-24 NOTE — Telephone Encounter (Signed)
Requested Prescriptions  Pending Prescriptions Disp Refills   venlafaxine XR (EFFEXOR-XR) 150 MG 24 hr capsule [Pharmacy Med Name: VENLAFAXINE ER 150MG  CAPSULES] 90 capsule 0    Sig: TAKE 1 CAPSULE(150 MG) BY MOUTH DAILY WITH BREAKFAST     Psychiatry: Antidepressants - SNRI - desvenlafaxine & venlafaxine Failed - 01/24/2022 11:05 AM      Failed - Lipid Panel in normal range within the last 12 months    Cholesterol, Total  Date Value Ref Range Status  07/04/2021 161 100 - 199 mg/dL Final   LDL Chol Calc (NIH)  Date Value Ref Range Status  07/04/2021 62 0 - 99 mg/dL Final   HDL  Date Value Ref Range Status  07/04/2021 83 >39 mg/dL Final   Triglycerides  Date Value Ref Range Status  07/04/2021 84 0 - 149 mg/dL Final         Passed - Cr in normal range and within 360 days    Creatinine, Ser  Date Value Ref Range Status  07/04/2021 0.72 0.57 - 1.00 mg/dL Final         Passed - Completed PHQ-2 or PHQ-9 in the last 360 days      Passed - Last BP in normal range    BP Readings from Last 1 Encounters:  08/03/21 130/82         Passed - Valid encounter within last 6 months    Recent Outpatient Visits           2 months ago Mixed hyperlipidemia   [facility], NP   5 months ago Benign essential tremor   [facility], NP   6 months ago Annual physical exam   [facility], NP   8 months ago Major depression, recurrent, chronic (HCC)   [facility], NP   9 months ago Major depression, recurrent, chronic (HCC)   [facility], NP

## 2022-02-09 ENCOUNTER — Other Ambulatory Visit: Payer: Self-pay | Admitting: Nurse Practitioner

## 2022-02-09 NOTE — Telephone Encounter (Signed)
Requested medications are due for refill today.  yes  Requested medications are on the active medications list.  yes  Last refill. 08/11/2021 #180 0 rf  Future visit scheduled.   no  Notes to clinic.  Sig from Med list and refill request do not match. Please review.    Requested Prescriptions  Pending Prescriptions Disp Refills   oxybutynin (DITROPAN-XL) 10 MG 24 hr tablet [Pharmacy Med Name: OXYBUTYNIN ER 10MG  TABLETS] 90 tablet     Sig: TAKE 1 TABLET(10 MG) BY MOUTH AT BEDTIME     Urology:  Bladder Agents Passed - 02/09/2022  4:49 PM      Passed - Valid encounter within last 12 months    Recent Outpatient Visits           3 months ago Mixed hyperlipidemia   [facility], NP   6 months ago Benign essential tremor   [facility], NP   7 months ago Annual physical exam   [facility], NP   8 months ago Major depression, recurrent, chronic (HCC)   [facility], NP   9 months ago Major depression, recurrent, chronic (HCC)   [facility], NP

## 2022-02-20 IMAGING — CT CT ABD-PEL WO/W CM
3 of 12 series · 11 of 46 positions shown, 17 images · IV contrast (APPLIED)
Comparison: None.

CLINICAL DATA: Gross and microscopic hematuria.

EXAM:
CT ABDOMEN AND PELVIS WITHOUT AND WITH CONTRAST
TECHNIQUE: Multidetector CT imaging of the abdomen and pelvis was performed
following the standard protocol before and following the bolus
administration of intravenous contrast.
CONTRAST:  100mL OMNIPAQUE IOHEXOL 300 MG/ML  SOLN

[Series 2: axial pre · axial · non-contrast · 0.70mm/px · z∈[-439,-114]mm · 6 of 91 slices shown, 11 images]
[im 13/91  soft-tissue]
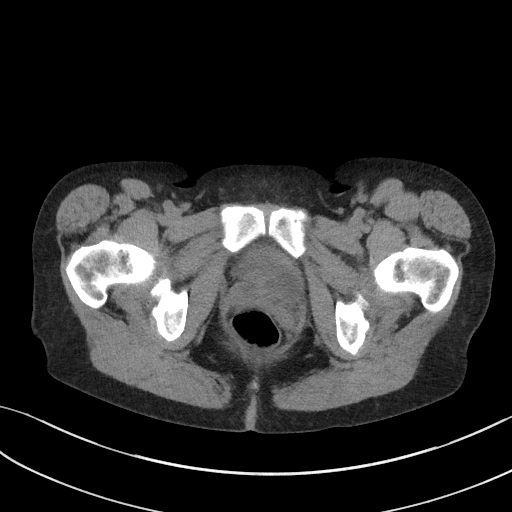
[im 13/91  bone]
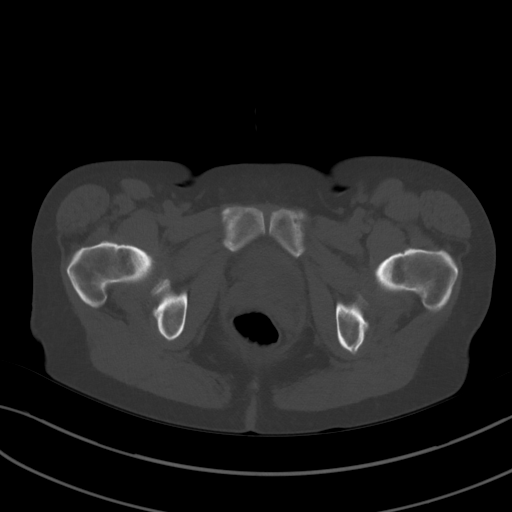
[im 26/91  soft-tissue]
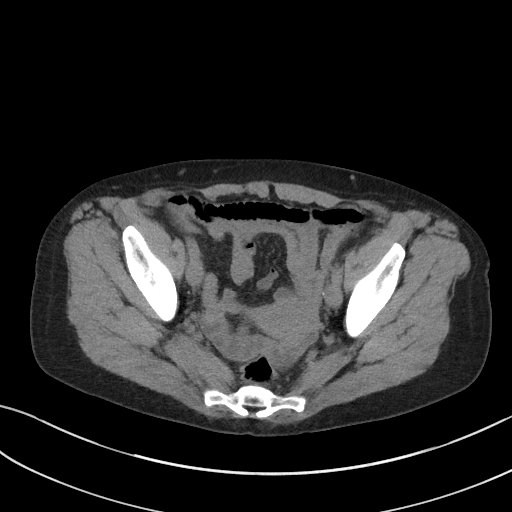
[im 39/91  soft-tissue]
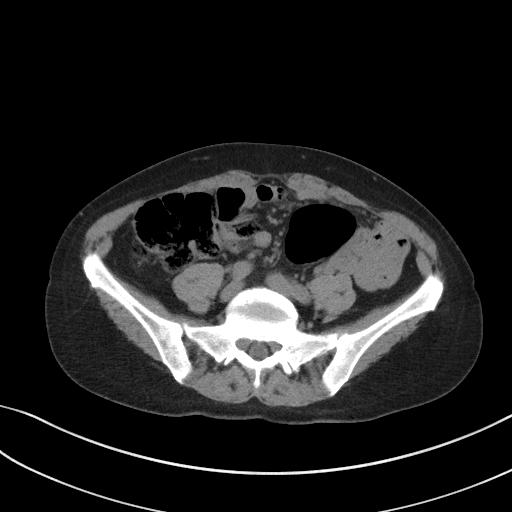
[im 39/91  lung]
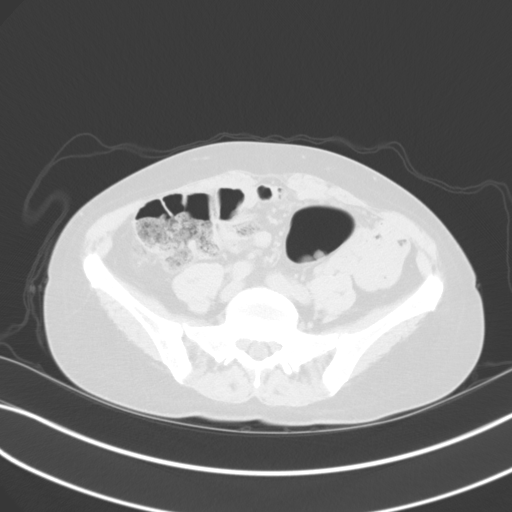
[im 52/91  soft-tissue]
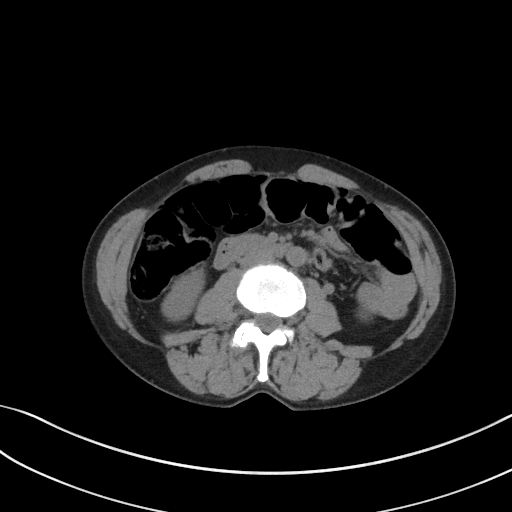
[im 52/91  lung]
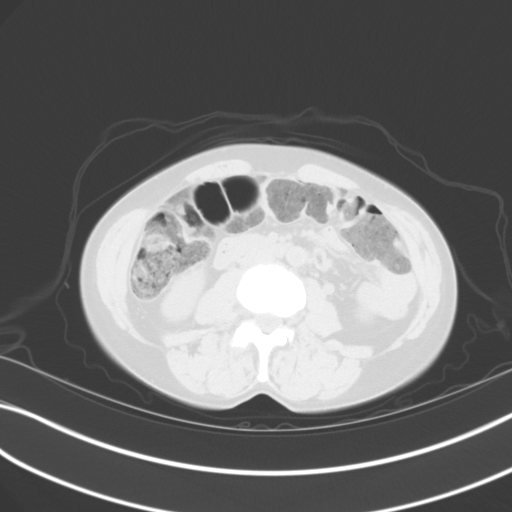
[im 65/91  soft-tissue]
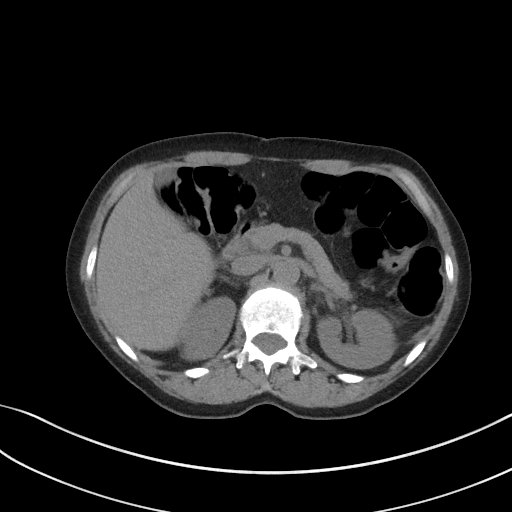
[im 65/91  lung]
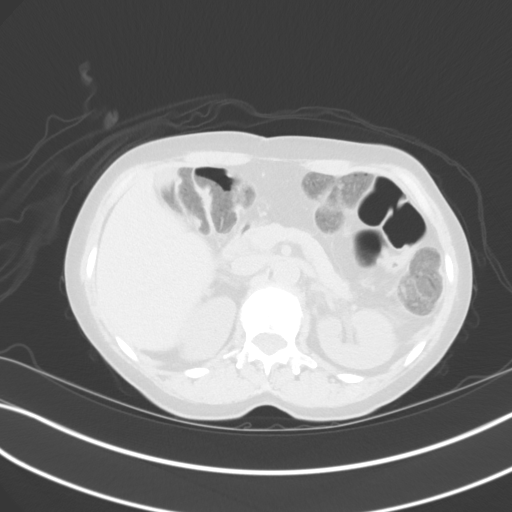
[im 78/91  soft-tissue]
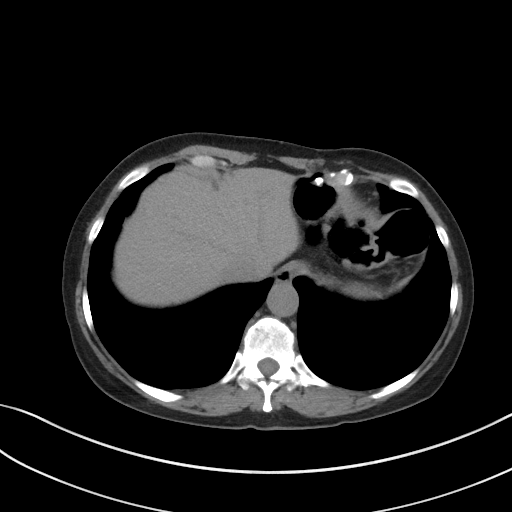
[im 78/91  lung]
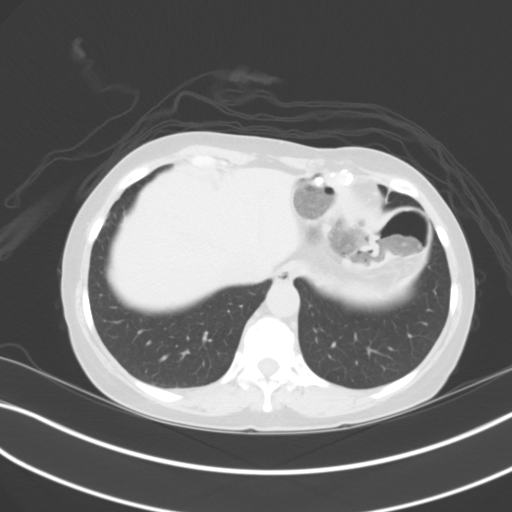

[Series 4: axial post · axial · 0.70mm/px · z∈[-439,-309]mm · 3 of 91 slices shown]
[im 13/91  soft-tissue]
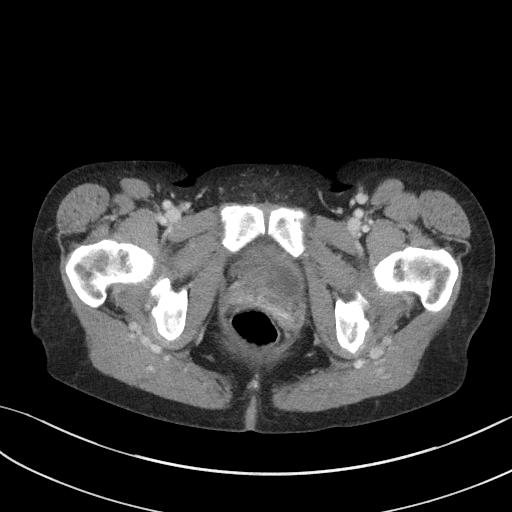
[im 26/91  soft-tissue]
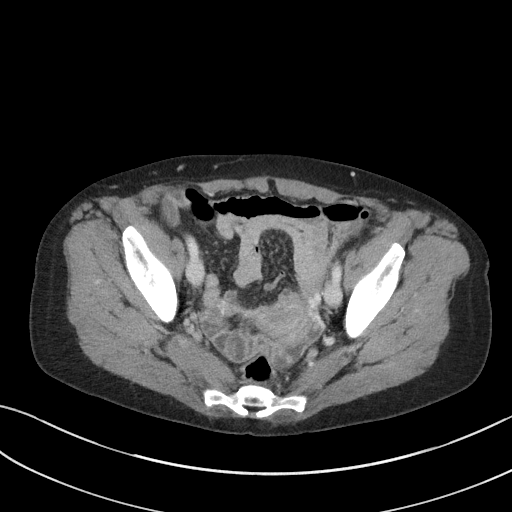
[im 39/91  soft-tissue]
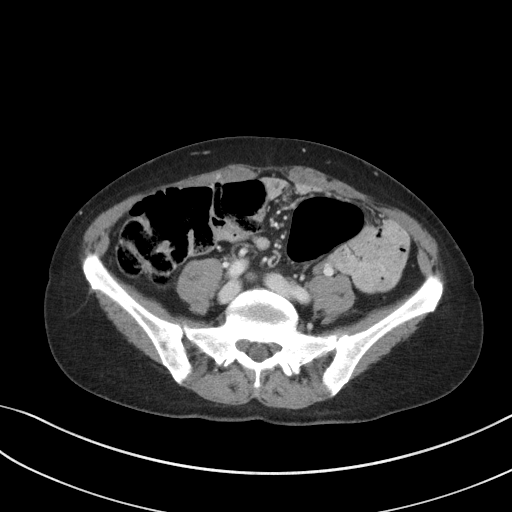

[Series 6: coronal pre · coronal · non-contrast · 0.57mm/px · 2 of 73 slices shown, 3 images]
[im 25/73  soft-tissue]
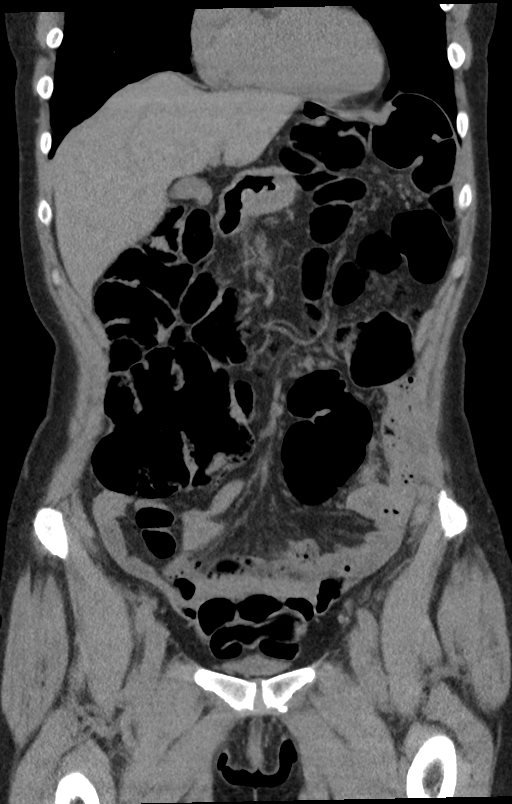
[im 25/73  bone]
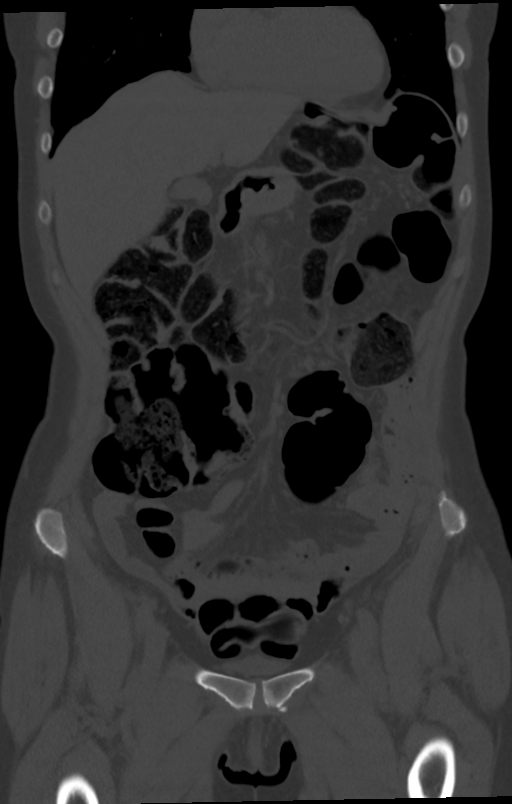
[im 49/73  soft-tissue]
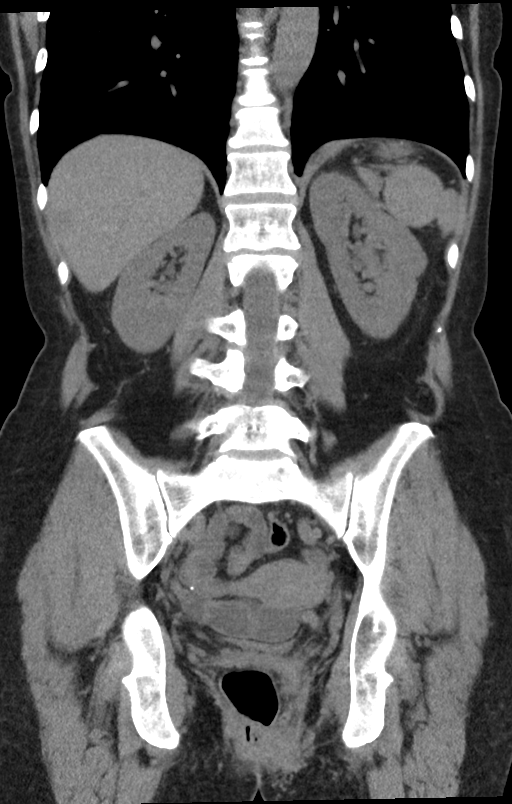

[11 of 46 positions shown; findings below may reference images not displayed]

FINDINGS: Lower chest: Normal heart size. Lung bases are clear. No pleural
effusion. Small amount of enhancing nodularity along the left
hemidiaphragm (image 57; series 10 and medial right hemithorax
(image 58; series 10). Findings may potentially represent pleural
based splenosis.

Hepatobiliary: Liver is normal in size and contour. Multiple
subcentimeter too small to characterize low-attenuation lesions are
demonstrated within the left and right hepatic lobes. Additionally
within the right hepatic lobe there is a subcapsular 1.5 x 1.3 cm
peripherally enhancing nodules compatible with hemangioma.

Pancreas: Unremarkable

Spleen: Suspect multiple splenules within the left upper quadrant.

Adrenals/Urinary Tract: Normal adrenal glands. Noncontrast images
demonstrate symmetric renal size. No nephroureterolithiasis. No
hydronephrosis. Post-contrast enhanced images demonstrate symmetric
renal enhancement. No suspicious enhancing renal masses. Delayed
images demonstrate excreted contrast material in the bilateral renal
collecting systems and ureters. No intraluminal filling defects are
identified. Urinary bladder is unremarkable.

Stomach/Bowel: No abnormal bowel wall thickening or evidence for
bowel obstruction. No free fluid or free intraperitoneal air.
Postsurgical changes involving the stomach.

Vascular/Lymphatic: Normal caliber abdominal aorta. Peripheral
calcified atherosclerotic plaque. No retroperitoneal
lymphadenopathy.

Reproductive: Unremarkable.

Other: None.

Musculoskeletal: Lower thoracic and lumbar spine degenerative
changes. No aggressive or acute appearing osseous lesions.
IMPRESSION: 1. No nephroureterolithiasis. No hydronephrosis. No suspicious
enhancing renal masses. No intraluminal filling defects identified
within the opacified renal collecting systems and ureters.

## 2022-02-21 ENCOUNTER — Other Ambulatory Visit: Payer: Self-pay | Admitting: Nurse Practitioner

## 2022-02-21 NOTE — Telephone Encounter (Signed)
Requested medication (s) are due for refill today: yes  Requested medication (s) are on the active medication list: yes  Last refill:  08/11/21 #180/0  Future visit scheduled: no  Notes to clinic:  pt called, states she wants to call at first of New Year to schedule appt. Pt is only taking 10mg  once in AM. Asking if can get refill for new dose until new year.      Requested Prescriptions  Pending Prescriptions Disp Refills   oxybutynin (DITROPAN-XL) 10 MG 24 hr tablet [Pharmacy Med Name: OXYBUTYNIN ER 10MG  TABLETS] 180 tablet 0    Sig: TAKE 1 TABLET(10 MG) BY MOUTH IN THE MORNING AND AT BEDTIME     Urology:  Bladder Agents Passed - 02/21/2022 10:00 AM      Passed - Valid encounter within last 12 months    Recent Outpatient Visits           3 months ago Mixed hyperlipidemia   [facility] 02/23/2022, NP   6 months ago Benign essential tremor   [facility], NP   7 months ago Annual physical exam   [facility] Larae Grooms, NP   9 months ago Major depression, recurrent, chronic (HCC)   [facility] Larae Grooms, NP   10 months ago Major depression, recurrent, chronic (HCC)   [facility] Larae Grooms, NP

## 2022-03-29 ENCOUNTER — Other Ambulatory Visit: Payer: Self-pay | Admitting: Nurse Practitioner

## 2022-03-29 NOTE — Telephone Encounter (Signed)
Requested Prescriptions  Pending Prescriptions Disp Refills   oxybutynin (DITROPAN-XL) 10 MG 24 hr tablet [Pharmacy Med Name: OXYBUTYNIN ER 10MG  TABLETS] 30 tablet 0    Sig: TAKE 1 TABLET(10 MG) BY MOUTH IN THE MORNING AND AT BEDTIME     Urology:  Bladder Agents Passed - 03/29/2022 12:48 PM      Passed - Valid encounter within last 12 months    Recent Outpatient Visits           4 months ago Mixed hyperlipidemia   [place], NP   7 months ago Benign essential tremor   [facility] Jon Billings, NP   8 months ago Annual physical exam   [place], NP   10 months ago Major depression, recurrent, chronic ([place])   [facility] Jon Billings, NP   11 months ago Major depression, recurrent, chronic ([place])   [facility] Jon Billings, NP

## 2022-05-11 ENCOUNTER — Other Ambulatory Visit: Payer: Self-pay | Admitting: Nurse Practitioner

## 2022-05-14 NOTE — Telephone Encounter (Signed)
Courtesy refill. Patient will need an office visit for further refills. Requested Prescriptions  Pending Prescriptions Disp Refills   oxybutynin (DITROPAN-XL) 10 MG 24 hr tablet [Pharmacy Med Name: OXYBUTYNIN ER '10MG'$  TABLETS] 180 tablet 0    Sig: TAKE 1 TABLET(10 MG) BY MOUTH IN THE MORNING AND AT BEDTIME     Urology:  Bladder Agents Passed - 05/11/2022 11:26 AM      Passed - Valid encounter within last 12 months    Recent Outpatient Visits           6 months ago Mixed hyperlipidemia   [place], NP   9 months ago Benign essential tremor   [place], NP   10 months ago Annual physical exam   [place] Jon Billings, NP   11 months ago Major depression, recurrent, chronic ([place])   [place] Jon Billings, NP   1 year ago Major depression, recurrent, chronic ([place])   [place] Jon Billings, NP               venlafaxine XR (EFFEXOR-XR) 150 MG 24 hr capsule [Pharmacy Med Name: VENLAFAXINE ER '150MG'$  CAPSULES] 30 capsule 0    Sig: TAKE 1 CAPSULE(150 MG) BY MOUTH DAILY WITH BREAKFAST     Psychiatry: Antidepressants - SNRI - desvenlafaxine & venlafaxine Failed - 05/11/2022 11:26 AM      Failed - Lipid Panel in normal range within the last 12 months    Cholesterol, Total  Date Value Ref Range Status  07/04/2021 161 100 - 199 mg/dL Final   LDL Chol Calc (NIH)  Date Value Ref Range Status  07/04/2021 62 0 - 99 mg/dL Final   HDL  Date Value Ref Range Status  07/04/2021 83 >39 mg/dL Final   Triglycerides  Date Value Ref Range Status  07/04/2021 84 0 - 149 mg/dL Final         Passed - Cr in normal range and within 360 days    Creatinine, Ser  Date Value Ref Range Status  07/04/2021 0.72 0.57 - 1.00 mg/dL Final         Passed - Completed PHQ-2 or PHQ-9 in the last 360 days      Passed - Last BP in  normal range    BP Readings from Last 1 Encounters:  08/03/21 130/82         Passed - Valid encounter within last 6 months    Recent Outpatient Visits           6 months ago Mixed hyperlipidemia   [place], NP   9 months ago Benign essential tremor   [place], NP   10 months ago Annual physical exam   [place], NP   11 months ago Major depression, recurrent, chronic [facility])   [place] Jon Billings, NP   1 year ago Major depression, recurrent, chronic [facility])   [place] Jon Billings, NP

## 2022-06-12 ENCOUNTER — Other Ambulatory Visit: Payer: Self-pay | Admitting: Nurse Practitioner

## 2022-06-12 NOTE — Telephone Encounter (Signed)
Requested medication (s) are due for refill today - yes  Requested medication (s) are on the active medication list -yes  Future visit scheduled -no  Last refill: 05/14/22 #30  Notes to clinic: last RF- courtesy- has notes  Requested Prescriptions  Pending Prescriptions Disp Refills   venlafaxine XR (EFFEXOR-XR) 150 MG 24 hr capsule [Pharmacy Med Name: VENLAFAXINE ER 150MG  CAPSULES] 30 capsule 0    Sig: TAKE 1 CAPSULE(150 MG) BY MOUTH DAILY WITH BREAKFAST NEEDS APPT FOR MORE REFILLS     Psychiatry: Antidepressants - SNRI - desvenlafaxine & venlafaxine Failed - 06/12/2022 10:24 AM      Failed - Lipid Panel in normal range within the last 12 months    Cholesterol, Total  Date Value Ref Range Status  07/04/2021 161 100 - 199 mg/dL Final   LDL Chol Calc (NIH)  Date Value Ref Range Status  07/04/2021 62 0 - 99 mg/dL Final   HDL  Date Value Ref Range Status  07/04/2021 83 >39 mg/dL Final   Triglycerides  Date Value Ref Range Status  07/04/2021 84 0 - 149 mg/dL Final         Passed - Cr in normal range and within 360 days    Creatinine, Ser  Date Value Ref Range Status  07/04/2021 0.72 0.57 - 1.00 mg/dL Final         Passed - Completed PHQ-2 or PHQ-9 in the last 360 days      Passed - Last BP in normal range    BP Readings from Last 1 Encounters:  08/03/21 130/82         Passed - Valid encounter within last 6 months    Recent Outpatient Visits           7 months ago Mixed hyperlipidemia   [place] Jon Billings, NP   10 months ago Benign essential tremor   [place], NP   11 months ago Annual physical exam   [place] Jon Billings, NP   1 year ago Major depression, recurrent, chronic ([place])   [place] Jon Billings, NP   1 year ago Major depression, recurrent, chronic ([place])   [place] Jon Billings, NP                 Requested Prescriptions  Pending Prescriptions Disp Refills   venlafaxine XR (EFFEXOR-XR) 150 MG 24 hr capsule [Pharmacy Med Name: VENLAFAXINE ER 150MG  CAPSULES] 30 capsule 0    Sig: TAKE 1 CAPSULE(150 MG) BY MOUTH DAILY WITH BREAKFAST NEEDS APPT FOR MORE REFILLS     Psychiatry: Antidepressants - SNRI - desvenlafaxine & venlafaxine Failed - 06/12/2022 10:24 AM      Failed - Lipid Panel in normal range within the last 12 months    Cholesterol, Total  Date Value Ref Range Status  07/04/2021 161 100 - 199 mg/dL Final   LDL Chol Calc (NIH)  Date Value Ref Range Status  07/04/2021 62 0 - 99 mg/dL Final   HDL  Date Value Ref Range Status  07/04/2021 83 >39 mg/dL Final   Triglycerides  Date Value Ref Range Status  07/04/2021 84 0 - 149 mg/dL Final         Passed - Cr in normal range and within 360 days    Creatinine, Ser  Date Value Ref Range Status  07/04/2021 0.72 0.57 - 1.00 mg/dL Final  Passed - Completed PHQ-2 or PHQ-9 in the last 360 days      Passed - Last BP in normal range    BP Readings from Last 1 Encounters:  08/03/21 130/82         Passed - Valid encounter within last 6 months    Recent Outpatient Visits           7 months ago Mixed hyperlipidemia   [place], Karen, NP   10 months ago Benign essential tremor   [place], NP   11 months ago Annual physical exam   [place] Jon Billings, NP   1 year ago Major depression, recurrent, chronic [facility])   [place] Jon Billings, NP   1 year ago Major depression, recurrent, chronic [facility])   [place] Jon Billings, NP

## 2022-06-26 ENCOUNTER — Encounter: Payer: Self-pay | Admitting: Nurse Practitioner

## 2022-06-26 ENCOUNTER — Ambulatory Visit (INDEPENDENT_AMBULATORY_CARE_PROVIDER_SITE_OTHER): Payer: Medicare HMO | Admitting: Nurse Practitioner

## 2022-06-26 VITALS — BP 155/73 | HR 64 | Temp 98.4°F | Ht 61.2 in | Wt 114.5 lb

## 2022-06-26 DIAGNOSIS — F339 Major depressive disorder, recurrent, unspecified: Secondary | ICD-10-CM

## 2022-06-26 DIAGNOSIS — E782 Mixed hyperlipidemia: Secondary | ICD-10-CM

## 2022-06-26 DIAGNOSIS — N3946 Mixed incontinence: Secondary | ICD-10-CM

## 2022-06-26 DIAGNOSIS — R69 Illness, unspecified: Secondary | ICD-10-CM | POA: Diagnosis not present

## 2022-06-26 DIAGNOSIS — I10 Essential (primary) hypertension: Secondary | ICD-10-CM

## 2022-06-26 DIAGNOSIS — F331 Major depressive disorder, recurrent, moderate: Secondary | ICD-10-CM | POA: Insufficient documentation

## 2022-06-26 MED ORDER — OXYBUTYNIN CHLORIDE ER 10 MG PO TB24: ORAL_TABLET | ORAL | 1 refills | Status: DC

## 2022-06-26 MED ORDER — LISINOPRIL 40 MG PO TABS: ORAL_TABLET | ORAL | 1 refills | Status: DC

## 2022-06-26 MED ORDER — VENLAFAXINE HCL ER 150 MG PO CP24: ORAL_CAPSULE | ORAL | 1 refills | Status: DC

## 2022-06-26 MED ORDER — ROSUVASTATIN CALCIUM 10 MG PO TABS: ORAL_TABLET | ORAL | 1 refills | Status: DC

## 2022-06-26 MED ORDER — HYDROCHLOROTHIAZIDE 12.5 MG PO CAPS: ORAL_CAPSULE | ORAL | 1 refills | Status: DC

## 2022-06-26 NOTE — Progress Notes (Signed)
BP (!) 155/73 (BP Location: Left Arm, Cuff Size: Normal)   Pulse 64   Temp 98.4 F (36.9 C) (Oral)   Ht 5' 1.2" (1.554 m)   Wt 114 lb 8 oz (51.9 kg)   SpO2 98%   BMI 21.49 kg/m    Subjective:    Patient ID: Eileen Stewart, female    DOB: [birth year], 72 y.o.   MRN: [id number]  HPI: Eileen Stewart is a 72 y.o. female  Chief Complaint  Patient presents with   Depression   Hyperlipidemia   Hypertension   HYPERTENSION / HYPERLIPIDEMIA Satisfied with current treatment? yes Duration of hypertension: years BP monitoring frequency: not checking BP range:  BP medication side effects: no Past BP meds: lisinopril-HCTZ Duration of hyperlipidemia: years Cholesterol medication side effects: no Cholesterol supplements: none Past cholesterol medications: rosuvastatin (crestor) Medication compliance: excellent compliance Aspirin: no Recent stressors: no Recurrent headaches: no Visual changes: no Palpitations: no Dyspnea: no Chest pain: no Lower extremity edema: no Dizzy/lightheaded: no  The 10-year ASCVD risk score (Arnett DK, et al., 2019) is: 13.8%   Values used to calculate the score:     Age: 36 years     Sex: Female     Is Non-Hispanic African American: No     Diabetic: No     Tobacco smoker: No     Systolic Blood Pressure: 155 mmHg     Is BP treated: Yes     HDL Cholesterol: 83 mg/dL     Total Cholesterol: 161 mg/dL    DEPRESSION Patient states she feels like she hasn't been able to concentrate and complete a task.  She is still taking Effexor 150mg .  She has been without the medication for 5 days and feels like she could cry at the drop of a hat.    INCONTINENCE Patient states the Oxybutynin has been helpful.  But she still has to wear a pad and when she is walking she has to wear a larger pad.    Relevant past medical, surgical, family and social history reviewed and updated as indicated. Interim medical history since our last visit reviewed. Allergies and  medications reviewed and updated.  Review of Systems  Eyes:  Negative for visual disturbance.  Respiratory:  Negative for cough, chest tightness and shortness of breath.   Cardiovascular:  Negative for chest pain, palpitations and leg swelling.  Genitourinary:        Incontinence   Neurological:  Negative for dizziness and headaches.    Per HPI unless specifically indicated above     Objective:    BP (!) 155/73 (BP Location: Left Arm, Cuff Size: Normal)   Pulse 64   Temp 98.4 F (36.9 C) (Oral)   Ht 5' 1.2" (1.554 m)   Wt 114 lb 8 oz (51.9 kg)   SpO2 98%   BMI 21.49 kg/m   Wt Readings from Last 3 Encounters:  06/26/22 114 lb 8 oz (51.9 kg)  08/03/21 115 lb 3.2 oz (52.3 kg)  07/04/21 115 lb (52.2 kg)    Physical Exam Vitals and nursing note reviewed.  Constitutional:      General: She is not in acute distress.    Appearance: Normal appearance. She is normal weight. She is not ill-appearing, toxic-appearing or diaphoretic.  HENT:     Head: Normocephalic.     Right Ear: External ear normal.     Left Ear: External ear normal.     Nose: Nose normal.     Mouth/Throat:  Mouth: Mucous membranes are moist.     Pharynx: Oropharynx is clear.  Eyes:     General:        Right eye: No discharge.        Left eye: No discharge.     Extraocular Movements: Extraocular movements intact.     Conjunctiva/sclera: Conjunctivae normal.     Pupils: Pupils are equal, round, and reactive to light.  Cardiovascular:     Rate and Rhythm: Normal rate and regular rhythm.     Heart sounds: No murmur heard. Pulmonary:     Effort: Pulmonary effort is normal. No respiratory distress.     Breath sounds: Normal breath sounds. No wheezing or rales.  Musculoskeletal:     Cervical back: Normal range of motion and neck supple.  Skin:    General: Skin is warm and dry.     Capillary Refill: Capillary refill takes less than 2 seconds.  Neurological:     General: No focal deficit present.      Mental Status: She is alert and oriented to person, place, and time. Mental status is at baseline.  Psychiatric:        Mood and Affect: Mood normal.        Behavior: Behavior normal.        Thought Content: Thought content normal.        Judgment: Judgment normal.     Results for orders placed or performed in visit on 08/03/21  Urine Culture   Specimen: Urine   UR  Result Value Ref Range   Urine Culture, Routine Final report    Organism ID, Bacteria Comment   Microscopic Examination   Urine  Result Value Ref Range   WBC, UA 0-5 0 - 5 /hpf   RBC, Urine 0-2 0 - 2 /hpf   Epithelial Cells (non renal) 0-10 0 - 10 /hpf   Bacteria, UA None seen None seen/Few  Urinalysis, Routine w reflex microscopic  Result Value Ref Range   Specific Gravity, UA 1.015 1.005 - 1.030   pH, UA 7.0 5.0 - 7.5   Color, UA Yellow Yellow   Appearance Ur Clear Clear   Leukocytes,UA Trace (A) Negative   Protein,UA Negative Negative/Trace   Glucose, UA Negative Negative   Ketones, UA Negative Negative   RBC, UA Trace (A) Negative   Bilirubin, UA Negative Negative   Urobilinogen, Ur 0.2 0.2 - 1.0 mg/dL   Nitrite, UA Negative Negative   Microscopic Examination See below:       Assessment & Plan:   Problem List Items Addressed This Visit       Cardiovascular and Mediastinum   Essential hypertension    Chronic.  Elevated at visit.  Taking Lisinopril and HCTZ. Refills sent today.  Recommend checking blood pressure at home over the next month and bringing log to next visit.  Labs ordered today.  Follow up in 1 month.  Call sooner if concerns arise.       Relevant Medications   rosuvastatin (CRESTOR) 10 MG tablet   lisinopril (ZESTRIL) 40 MG tablet   hydrochlorothiazide (MICROZIDE) 12.5 MG capsule   Other Relevant Orders   Lipid Profile     Other   Hyperlipidemia    Chronic.  Controlled.  Patient agrees to restart Crestor.  Discussed ASCVD risk score with patient during visit.  Refills sent today.   Labs ordered today.  Return to clinic in 6 months for reevaluation.  Call sooner if concerns arise.  Relevant Medications   rosuvastatin (CRESTOR) 10 MG tablet   lisinopril (ZESTRIL) 40 MG tablet   hydrochlorothiazide (MICROZIDE) 12.5 MG capsule   Other Relevant Orders   Comp Met (CMET)   Major depression, recurrent, chronic    Chronic.  Controlled.  Continue with current medication regimen of Effexor  daily.  Refills sent today.  Labs ordered today.  Return to clinic in 6 months for reevaluation.  Call sooner if concerns arise.        Relevant Medications   venlafaxine XR (EFFEXOR-XR) 150 MG 24 hr capsule   Mixed incontinence    Chronic. Ongoing. Will refer to pelvic floor physical therapy.        Relevant Orders   Ambulatory referral to Physical Therapy   RESOLVED: Moderate episode of recurrent major depressive disorder - Primary   Relevant Medications   venlafaxine XR (EFFEXOR-XR) 150 MG 24 hr capsule     Follow up plan: Return in about 1 month (around 07/26/2022) for BP Check, Medication Management.   A total of 30 minutes were spent on this encounter today.  When total time is documented, this includes both the face-to-face and non-face-to-face time personally spent before, during and after the visit on the date of the encounter reviewing ASCVD risk score, pelvic floor physical therapy, blood pressure and labs.

## 2022-06-26 NOTE — Assessment & Plan Note (Signed)
Chronic.  Controlled.  Continue with current medication regimen of Effexor  daily.  Refills sent today.  Labs ordered today.  Return to clinic in 6 months for reevaluation.  Call sooner if concerns arise.

## 2022-06-26 NOTE — Assessment & Plan Note (Signed)
Chronic.  Controlled.  Patient agrees to restart Crestor.  Discussed ASCVD risk score with patient during visit.  Refills sent today.  Labs ordered today.  Return to clinic in 6 months for reevaluation.  Call sooner if concerns arise.

## 2022-06-26 NOTE — Assessment & Plan Note (Signed)
Chronic. Ongoing. Will refer to pelvic floor physical therapy.

## 2022-06-26 NOTE — Assessment & Plan Note (Signed)
Chronic.  Elevated at visit.  Taking Lisinopril and HCTZ. Refills sent today.  Recommend checking blood pressure at home over the next month and bringing log to next visit.  Labs ordered today.  Follow up in 1 month.  Call sooner if concerns arise.

## 2022-06-27 LAB — COMPREHENSIVE METABOLIC PANEL
ALT: 18 IU/L (ref 0–32)
AST: 22 IU/L (ref 0–40)
Albumin/Globulin Ratio: 1.9 (ref 1.2–2.2)
Albumin: 4.6 g/dL (ref 3.9–4.9)
Alkaline Phosphatase: 80 IU/L (ref 44–121)
BUN/Creatinine Ratio: 13 (ref 12–28)
BUN: 9 mg/dL (ref 8–27)
Bilirubin Total: 0.4 mg/dL (ref 0.0–1.2)
CO2: 22 mmol/L (ref 20–29)
Calcium: 9.7 mg/dL (ref 8.7–10.3)
Chloride: 92 mmol/L — ABNORMAL LOW (ref 96–106)
Creatinine, Ser: 0.72 mg/dL (ref 0.57–1.00)
Globulin, Total: 2.4 g/dL (ref 1.5–4.5)
Glucose: 82 mg/dL (ref 70–99)
Potassium: 4.1 mmol/L (ref 3.5–5.2)
Sodium: 133 mmol/L — ABNORMAL LOW (ref 134–144)
Total Protein: 7 g/dL (ref 6.0–8.5)
eGFR: 90 mL/min/{1.73_m2} (ref >59)

## 2022-06-27 LAB — LIPID PANEL
Chol/HDL Ratio: 3.2 ratio (ref 0.0–4.4)
Cholesterol, Total: 208 mg/dL — ABNORMAL HIGH (ref 100–199)
HDL: 66 mg/dL (ref >39)
LDL Chol Calc (NIH): 120 mg/dL — ABNORMAL HIGH (ref 0–99)
Triglycerides: 124 mg/dL (ref 0–149)
VLDL Cholesterol Cal: 22 mg/dL (ref 5–40)

## 2022-06-27 NOTE — Progress Notes (Signed)
Hi Eileen Stewart. It was nice to see you yesterday.  Your lab work looks good.  Your cholesterol is elevated from prior.  I do recommend restarting the rosuvastatin.  No concerns at this time. Continue with your current medication regimen.  Follow up as discussed.  Please let me know if you have any questions.

## 2022-07-12 ENCOUNTER — Other Ambulatory Visit: Payer: Self-pay | Admitting: Nurse Practitioner

## 2022-07-12 NOTE — Telephone Encounter (Signed)
Unable to refill per protocol, Rx request is too soon. Last refill 06/26/22 for 90 days.  Requested Prescriptions  Pending Prescriptions Disp Refills   hydrochlorothiazide (MICROZIDE) 12.5 MG capsule [Pharmacy Med Name: HYDROCHLOROTHIAZIDE 12.5MG  CAPSULES] 90 capsule 1    Sig: TAKE 1 CAPSULE(12.5 MG) BY MOUTH DAILY     Cardiovascular: Diuretics - Thiazide Failed - 07/12/2022  9:12 AM      Failed - Na in normal range and within 180 days    Sodium  Date Value Ref Range Status  06/26/2022 133 (L) 134 - 144 mmol/L Final         Failed - Last BP in normal range    BP Readings from Last 1 Encounters:  06/26/22 (!) 155/73         Passed - Cr in normal range and within 180 days    Creatinine, Ser  Date Value Ref Range Status  06/26/2022 0.72 0.57 - 1.00 mg/dL Final         Passed - K in normal range and within 180 days    Potassium  Date Value Ref Range Status  06/26/2022 4.1 3.5 - 5.2 mmol/L Final         Passed - Valid encounter within last 6 months    Recent Outpatient Visits           2 weeks ago Moderate episode of recurrent major depressive disorder [facility])   [place] [facility] Larae Grooms, NP   8 months ago Mixed hyperlipidemia   [place] [facility] [place] Larae Grooms, NP   11 months ago Benign essential tremor   [place] [facility] Larae Grooms, NP   1 year ago Annual physical exam   [place] [facility] Larae Grooms, NP   1 year ago Major depression, recurrent, chronic [facility])   [place] [facility] Larae Grooms, NP       Future Appointments             In 2 weeks Larae Grooms, NP [place] [facility], [facility]

## 2022-07-26 ENCOUNTER — Ambulatory Visit (INDEPENDENT_AMBULATORY_CARE_PROVIDER_SITE_OTHER): Payer: Medicare HMO | Admitting: Nurse Practitioner

## 2022-07-26 ENCOUNTER — Encounter: Payer: Self-pay | Admitting: Nurse Practitioner

## 2022-07-26 VITALS — BP 124/84 | HR 69 | Temp 97.9°F | Wt 115.3 lb

## 2022-07-26 DIAGNOSIS — N3946 Mixed incontinence: Secondary | ICD-10-CM

## 2022-07-26 DIAGNOSIS — F339 Major depressive disorder, recurrent, unspecified: Secondary | ICD-10-CM | POA: Diagnosis not present

## 2022-07-26 DIAGNOSIS — I10 Essential (primary) hypertension: Secondary | ICD-10-CM

## 2022-07-26 MED ORDER — VENLAFAXINE HCL ER 75 MG PO CP24: 75.0000 mg | ORAL_CAPSULE | Freq: Every day | ORAL | 1 refills | Status: DC

## 2022-07-26 NOTE — Progress Notes (Signed)
BP 124/84 Comment: home blood pressure reading  Pulse 69   Temp 97.9 F (36.6 C) (Oral)   Wt 115 lb 4.8 oz (52.3 kg)   SpO2 99%   BMI 21.64 kg/m    Subjective:    Patient ID: Eileen Stewart, female    DOB: [birth year], 72 y.o.   MRN: [id number]  HPI: Eileen Stewart is a 72 y.o. female  Chief Complaint  Patient presents with   Hypertension   Medication Management   HYPERTENSION / HYPERLIPIDEMIA Satisfied with current treatment? yes Duration of hypertension: years BP monitoring frequency: checking at home BP range: 120-130/80 BP medication side effects: no Past BP meds: lisinopril-HCTZ Duration of hyperlipidemia: years Cholesterol medication side effects: no Cholesterol supplements: none Past cholesterol medications: rosuvastatin (crestor) Medication compliance: excellent compliance Aspirin: no Recent stressors: no Recurrent headaches: no Visual changes: no Palpitations: no Dyspnea: no Chest pain: no Lower extremity edema: no Dizzy/lightheaded: no  The 10-year ASCVD risk score (Arnett DK, et al., 2019) is: 10.4%   Values used to calculate the score:     Age: 40 years     Sex: Female     Is Non-Hispanic African American: No     Diabetic: No     Tobacco smoker: No     Systolic Blood Pressure: 124 mmHg     Is BP treated: Yes     HDL Cholesterol: 66 mg/dL     Total Cholesterol: 208 mg/dL  MOOD Patient states she feels like she has more bad days.  Some days she is more anxious and some day she is more down.  Feels like she would benefit from increasing the medication.   Flowsheet Row Office Visit from 07/26/2022 in [facility]  PHQ-9 Total Score 5         Relevant past medical, surgical, family and social history reviewed and updated as indicated. Interim medical history since our last visit reviewed. Allergies and medications reviewed and updated.  Review of Systems  Eyes:  Negative for visual disturbance.  Respiratory:   Negative for cough, chest tightness and shortness of breath.   Cardiovascular:  Negative for chest pain, palpitations and leg swelling.  Genitourinary:        Incontinence   Neurological:  Negative for dizziness and headaches.  Psychiatric/Behavioral:  Positive for dysphoric mood. Negative for suicidal ideas. The patient is nervous/anxious.     Per HPI unless specifically indicated above     Objective:    BP 124/84 Comment: home blood pressure reading  Pulse 69   Temp 97.9 F (36.6 C) (Oral)   Wt 115 lb 4.8 oz (52.3 kg)   SpO2 99%   BMI 21.64 kg/m   Wt Readings from Last 3 Encounters:  07/26/22 115 lb 4.8 oz (52.3 kg)  06/26/22 114 lb 8 oz (51.9 kg)  08/03/21 115 lb 3.2 oz (52.3 kg)    Physical Exam Vitals and nursing note reviewed.  Constitutional:      General: She is not in acute distress.    Appearance: Normal appearance. She is normal weight. She is not ill-appearing, toxic-appearing or diaphoretic.  HENT:     Head: Normocephalic.     Right Ear: External ear normal.     Left Ear: External ear normal.     Nose: Nose normal.     Mouth/Throat:     Mouth: Mucous membranes are moist.     Pharynx: Oropharynx is clear.  Eyes:     General:  Right eye: No discharge.        Left eye: No discharge.     Extraocular Movements: Extraocular movements intact.     Conjunctiva/sclera: Conjunctivae normal.     Pupils: Pupils are equal, round, and reactive to light.  Cardiovascular:     Rate and Rhythm: Normal rate and regular rhythm.     Heart sounds: No murmur heard. Pulmonary:     Effort: Pulmonary effort is normal. No respiratory distress.     Breath sounds: Normal breath sounds. No wheezing or rales.  Musculoskeletal:     Cervical back: Normal range of motion and neck supple.  Skin:    General: Skin is warm and dry.     Capillary Refill: Capillary refill takes less than 2 seconds.  Neurological:     General: No focal deficit present.     Mental Status: She is  alert and oriented to person, place, and time. Mental status is at baseline.  Psychiatric:        Mood and Affect: Mood normal.        Behavior: Behavior normal.        Thought Content: Thought content normal.        Judgment: Judgment normal.     Results for orders placed or performed in visit on 06/26/22  Comp Met (CMET)  Result Value Ref Range   Glucose 82 70 - 99 mg/dL   BUN 9 8 - 27 mg/dL   Creatinine, Ser 6.57 0.57 - 1.00 mg/dL   eGFR 90 >84 ON/GEX/5.28   BUN/Creatinine Ratio 13 12 - 28   Sodium 133 (L) 134 - 144 mmol/L   Potassium 4.1 3.5 - 5.2 mmol/L   Chloride 92 (L) 96 - 106 mmol/L   CO2 22 20 - 29 mmol/L   Calcium 9.7 8.7 - 10.3 mg/dL   Total Protein 7.0 6.0 - 8.5 g/dL   Albumin 4.6 3.9 - 4.9 g/dL   Globulin, Total 2.4 1.5 - 4.5 g/dL   Albumin/Globulin Ratio 1.9 1.2 - 2.2   Bilirubin Total 0.4 0.0 - 1.2 mg/dL   Alkaline Phosphatase 80 44 - 121 IU/L   AST 22 0 - 40 IU/L   ALT 18 0 - 32 IU/L  Lipid Profile  Result Value Ref Range   Cholesterol, Total 208 (H) 100 - 199 mg/dL   Triglycerides 413 0 - 149 mg/dL   HDL 66 >24 mg/dL   VLDL Cholesterol Cal 22 5 - 40 mg/dL   LDL Chol Calc (NIH) 401 (H) 0 - 99 mg/dL   Chol/HDL Ratio 3.2 0.0 - 4.4 ratio      Assessment & Plan:   Problem List Items Addressed This Visit       Cardiovascular and Mediastinum   Essential hypertension - Primary    Chronic.  Elevated at visit but well controlled at home.  Continue with current medications.  If still elevated at next visit can consider changing therapy.  Follow up in 5 months.  Call sooner if concerns arise.         Other   Major depression, recurrent, chronic (HCC)    Chronic. Not well controlled.  Patient would like to increase her Effexor.  Will increase to Effexor to 225mg  daily.  Follow up in 5 months.  Call sooner if concerns arise.       Relevant Medications   venlafaxine XR (EFFEXOR XR) 75 MG 24 hr capsule   Mixed incontinence    New referral placed for  patient during visit.      Relevant Orders   Ambulatory referral to Physical Therapy     Follow up plan: Return in about 5 months (around 12/26/2022) for HTN, HLD, DM2 FU.

## 2022-07-26 NOTE — Assessment & Plan Note (Signed)
New referral placed for patient during visit.

## 2022-07-26 NOTE — Assessment & Plan Note (Signed)
Chronic.  Elevated at visit but well controlled at home.  Continue with current medications.  If still elevated at next visit can consider changing therapy.  Follow up in 5 months.  Call sooner if concerns arise.

## 2022-07-26 NOTE — Assessment & Plan Note (Signed)
Chronic. Not well controlled.  Patient would like to increase her Effexor.  Will increase to Effexor to 225mg  daily.  Follow up in 5 months.  Call sooner if concerns arise.

## 2022-11-20 ENCOUNTER — Other Ambulatory Visit: Payer: Self-pay | Admitting: Nurse Practitioner

## 2022-11-21 NOTE — Telephone Encounter (Signed)
Requested Prescriptions  Pending Prescriptions Disp Refills   rosuvastatin (CRESTOR) 10 MG tablet [Pharmacy Med Name: ROSUVASTATIN 10MG  TABLETS] 90 tablet 1    Sig: TAKE 1 TABLET(10 MG) BY MOUTH DAILY     Cardiovascular:  Antilipid - Statins 2 Failed - 11/20/2022 12:41 PM      Failed - Lipid Panel in normal range within the last 12 months    Cholesterol, Total  Date Value Ref Range Status  06/26/2022 208 (H) 100 - 199 mg/dL Final   LDL Chol Calc (NIH)  Date Value Ref Range Status  06/26/2022 120 (H) 0 - 99 mg/dL Final   HDL  Date Value Ref Range Status  06/26/2022 66 >39 mg/dL Final   Triglycerides  Date Value Ref Range Status  06/26/2022 124 0 - 149 mg/dL Final         Passed - Cr in normal range and within 360 days    Creatinine, Ser  Date Value Ref Range Status  06/26/2022 0.72 0.57 - 1.00 mg/dL Final         Passed - Patient is not pregnant      Passed - Valid encounter within last 12 months    Recent Outpatient Visits           3 months ago Essential hypertension   [place] [facility] Larae Grooms, NP   4 months ago Moderate episode of recurrent major depressive disorder [facility])   [place] [facility] Larae Grooms, NP   1 year ago Mixed hyperlipidemia   [place] [facility] Larae Grooms, NP   1 year ago Benign essential tremor   [place] [facility] Larae Grooms, NP   1 year ago Annual physical exam   [place] [facility] Larae Grooms, NP       Future Appointments             In 1 month Larae Grooms, NP [place] [facility], [facility]

## 2022-12-27 NOTE — Progress Notes (Deleted)
There were no vitals taken for this visit.   Subjective:    Patient ID: Eileen Stewart, female    DOB: 06-01-1950, 72 y.o.   MRN: [id number]  HPI: Eileen Stewart is a 72 y.o. female  No chief complaint on file.  HYPERTENSION / HYPERLIPIDEMIA Satisfied with current treatment? yes Duration of hypertension: years BP monitoring frequency: not checking BP range:  BP medication side effects: no Past BP meds: lisinopril-HCTZ Duration of hyperlipidemia: years Cholesterol medication side effects: no Cholesterol supplements: none Past cholesterol medications: rosuvastatin (crestor) Medication compliance: excellent compliance Aspirin: no Recent stressors: no Recurrent headaches: no Visual changes: no Palpitations: no Dyspnea: no Chest pain: no Lower extremity edema: no Dizzy/lightheaded: no  The 10-year ASCVD risk score (Arnett DK, et al., 2019) is: 11.6%   Values used to calculate the score:     Age: 27 years     Sex: Female     Is Non-Hispanic African American: No     Diabetic: No     Tobacco smoker: No     Systolic Blood Pressure: 124 mmHg     Is BP treated: Yes     HDL Cholesterol: 66 mg/dL     Total Cholesterol: 208 mg/dL    DEPRESSION Patient states she feels like she hasn't been able to concentrate and complete a task.  She is still taking Effexor 150mg .  She has been without the medication for 5 days and feels like she could cry at the drop of a hat.    INCONTINENCE Patient states the Oxybutynin has been helpful.  But she still has to wear a pad and when she is walking she has to wear a larger pad.    Relevant past medical, surgical, family and social history reviewed and updated as indicated. Interim medical history since our last visit reviewed. Allergies and medications reviewed and updated.  Review of Systems  Eyes:  Negative for visual disturbance.  Respiratory:  Negative for cough, chest tightness and shortness of breath.   Cardiovascular:  Negative  for chest pain, palpitations and leg swelling.  Genitourinary:        Incontinence   Neurological:  Negative for dizziness and headaches.    Per HPI unless specifically indicated above     Objective:    There were no vitals taken for this visit.  Wt Readings from Last 3 Encounters:  07/26/22 115 lb 4.8 oz (52.3 kg)  06/26/22 114 lb 8 oz (51.9 kg)  08/03/21 115 lb 3.2 oz (52.3 kg)    Physical Exam Vitals and nursing note reviewed.  Constitutional:      General: She is not in acute distress.    Appearance: Normal appearance. She is normal weight. She is not ill-appearing, toxic-appearing or diaphoretic.  HENT:     Head: Normocephalic.     Right Ear: External ear normal.     Left Ear: External ear normal.     Nose: Nose normal.     Mouth/Throat:     Mouth: Mucous membranes are moist.     Pharynx: Oropharynx is clear.  Eyes:     General:        Right eye: No discharge.        Left eye: No discharge.     Extraocular Movements: Extraocular movements intact.     Conjunctiva/sclera: Conjunctivae normal.     Pupils: Pupils are equal, round, and reactive to light.  Cardiovascular:     Rate and Rhythm: Normal rate and regular rhythm.  Heart sounds: No murmur heard. Pulmonary:     Effort: Pulmonary effort is normal. No respiratory distress.     Breath sounds: Normal breath sounds. No wheezing or rales.  Musculoskeletal:     Cervical back: Normal range of motion and neck supple.  Skin:    General: Skin is warm and dry.     Capillary Refill: Capillary refill takes less than 2 seconds.  Neurological:     General: No focal deficit present.     Mental Status: She is alert and oriented to person, place, and time. Mental status is at baseline.  Psychiatric:        Mood and Affect: Mood normal.        Behavior: Behavior normal.        Thought Content: Thought content normal.        Judgment: Judgment normal.    Results for orders placed or performed in visit on 06/26/22   Comp Met (CMET)  Result Value Ref Range   Glucose 82 70 - 99 mg/dL   BUN 9 8 - 27 mg/dL   Creatinine, Ser 1.30 0.57 - 1.00 mg/dL   eGFR 90 >86 VH/QIO/9.62   BUN/Creatinine Ratio 13 12 - 28   Sodium 133 (L) 134 - 144 mmol/L   Potassium 4.1 3.5 - 5.2 mmol/L   Chloride 92 (L) 96 - 106 mmol/L   CO2 22 20 - 29 mmol/L   Calcium 9.7 8.7 - 10.3 mg/dL   Total Protein 7.0 6.0 - 8.5 g/dL   Albumin 4.6 3.9 - 4.9 g/dL   Globulin, Total 2.4 1.5 - 4.5 g/dL   Albumin/Globulin Ratio 1.9 1.2 - 2.2   Bilirubin Total 0.4 0.0 - 1.2 mg/dL   Alkaline Phosphatase 80 44 - 121 IU/L   AST 22 0 - 40 IU/L   ALT 18 0 - 32 IU/L  Lipid Profile  Result Value Ref Range   Cholesterol, Total 208 (H) 100 - 199 mg/dL   Triglycerides 952 0 - 149 mg/dL   HDL 66 >84 mg/dL   VLDL Cholesterol Cal 22 5 - 40 mg/dL   LDL Chol Calc (NIH) 132 (H) 0 - 99 mg/dL   Chol/HDL Ratio 3.2 0.0 - 4.4 ratio      Assessment & Plan:   Problem List Items Addressed This Visit       Cardiovascular and Mediastinum   Essential hypertension - Primary     Other   Hyperlipidemia   Major depression, recurrent, chronic (HCC)   Mixed incontinence     Follow up plan: No follow-ups on file.   A total of 30 minutes were spent on this encounter today.  When total time is documented, this includes both the face-to-face and non-face-to-face time personally spent before, during and after the visit on the date of the encounter reviewing ASCVD risk score, pelvic floor physical therapy, blood pressure and labs.

## 2022-12-28 ENCOUNTER — Ambulatory Visit: Payer: Medicare HMO | Admitting: Nurse Practitioner

## 2022-12-28 DIAGNOSIS — F339 Major depressive disorder, recurrent, unspecified: Secondary | ICD-10-CM

## 2022-12-28 DIAGNOSIS — E782 Mixed hyperlipidemia: Secondary | ICD-10-CM

## 2022-12-28 DIAGNOSIS — I10 Essential (primary) hypertension: Secondary | ICD-10-CM

## 2022-12-28 DIAGNOSIS — N3946 Mixed incontinence: Secondary | ICD-10-CM

## 2023-01-07 ENCOUNTER — Telehealth: Payer: Self-pay | Admitting: Nurse Practitioner

## 2023-01-07 NOTE — Telephone Encounter (Signed)
Copied from CRM [id number]. Topic: Medicare AWV >> Jan 07, 2023  9:47 AM Payton Doughty wrote: Reason for CRM: Called LVM 01/07/19 24 to schedule Annual Wellness Visit  Verlee Rossetti; Care Guide Ambulatory Clinical Support [place] [facility] Direct Dial: [phone]

## 2023-01-10 NOTE — Progress Notes (Deleted)
There were no vitals taken for this visit.   Subjective:    Patient ID: Eileen Stewart, female    DOB: 1950/11/24, 72 y.o.   MRN: [id number]  HPI: Eileen Stewart is a 72 y.o. female  No chief complaint on file.  HYPERTENSION / HYPERLIPIDEMIA Satisfied with current treatment? yes Duration of hypertension: years BP monitoring frequency: not checking BP range:  BP medication side effects: no Past BP meds: lisinopril-HCTZ Duration of hyperlipidemia: years Cholesterol medication side effects: no Cholesterol supplements: none Past cholesterol medications: rosuvastatin (crestor) Medication compliance: excellent compliance Aspirin: no Recent stressors: no Recurrent headaches: no Visual changes: no Palpitations: no Dyspnea: no Chest pain: no Lower extremity edema: no Dizzy/lightheaded: no  The 10-year ASCVD risk score (Arnett DK, et al., 2019) is: 11.6%   Values used to calculate the score:     Age: 14 years     Sex: Female     Is Non-Hispanic African American: No     Diabetic: No     Tobacco smoker: No     Systolic Blood Pressure: 124 mmHg     Is BP treated: Yes     HDL Cholesterol: 66 mg/dL     Total Cholesterol: 208 mg/dL    DEPRESSION Patient states she feels like she hasn't been able to concentrate and complete a task.  She is still taking Effexor 150mg .  She has been without the medication for 5 days and feels like she could cry at the drop of a hat.    INCONTINENCE Patient states the Oxybutynin has been helpful.  But she still has to wear a pad and when she is walking she has to wear a larger pad.    Relevant past medical, surgical, family and social history reviewed and updated as indicated. Interim medical history since our last visit reviewed. Allergies and medications reviewed and updated.  Review of Systems  Eyes:  Negative for visual disturbance.  Respiratory:  Negative for cough, chest tightness and shortness of breath.   Cardiovascular:  Negative  for chest pain, palpitations and leg swelling.  Genitourinary:        Incontinence   Neurological:  Negative for dizziness and headaches.    Per HPI unless specifically indicated above     Objective:    There were no vitals taken for this visit.  Wt Readings from Last 3 Encounters:  07/26/22 115 lb 4.8 oz (52.3 kg)  06/26/22 114 lb 8 oz (51.9 kg)  08/03/21 115 lb 3.2 oz (52.3 kg)    Physical Exam Vitals and nursing note reviewed.  Constitutional:      General: She is not in acute distress.    Appearance: Normal appearance. She is normal weight. She is not ill-appearing, toxic-appearing or diaphoretic.  HENT:     Head: Normocephalic.     Right Ear: External ear normal.     Left Ear: External ear normal.     Nose: Nose normal.     Mouth/Throat:     Mouth: Mucous membranes are moist.     Pharynx: Oropharynx is clear.  Eyes:     General:        Right eye: No discharge.        Left eye: No discharge.     Extraocular Movements: Extraocular movements intact.     Conjunctiva/sclera: Conjunctivae normal.     Pupils: Pupils are equal, round, and reactive to light.  Cardiovascular:     Rate and Rhythm: Normal rate and regular rhythm.  Heart sounds: No murmur heard. Pulmonary:     Effort: Pulmonary effort is normal. No respiratory distress.     Breath sounds: Normal breath sounds. No wheezing or rales.  Musculoskeletal:     Cervical back: Normal range of motion and neck supple.  Skin:    General: Skin is warm and dry.     Capillary Refill: Capillary refill takes less than 2 seconds.  Neurological:     General: No focal deficit present.     Mental Status: She is alert and oriented to person, place, and time. Mental status is at baseline.  Psychiatric:        Mood and Affect: Mood normal.        Behavior: Behavior normal.        Thought Content: Thought content normal.        Judgment: Judgment normal.     Results for orders placed or performed in visit on 06/26/22   Comp Met (CMET)  Result Value Ref Range   Glucose 82 70 - 99 mg/dL   BUN 9 8 - 27 mg/dL   Creatinine, Ser 1.61 0.57 - 1.00 mg/dL   eGFR 90 >09 UE/AVW/0.98   BUN/Creatinine Ratio 13 12 - 28   Sodium 133 (L) 134 - 144 mmol/L   Potassium 4.1 3.5 - 5.2 mmol/L   Chloride 92 (L) 96 - 106 mmol/L   CO2 22 20 - 29 mmol/L   Calcium 9.7 8.7 - 10.3 mg/dL   Total Protein 7.0 6.0 - 8.5 g/dL   Albumin 4.6 3.9 - 4.9 g/dL   Globulin, Total 2.4 1.5 - 4.5 g/dL   Albumin/Globulin Ratio 1.9 1.2 - 2.2   Bilirubin Total 0.4 0.0 - 1.2 mg/dL   Alkaline Phosphatase 80 44 - 121 IU/L   AST 22 0 - 40 IU/L   ALT 18 0 - 32 IU/L  Lipid Profile  Result Value Ref Range   Cholesterol, Total 208 (H) 100 - 199 mg/dL   Triglycerides 119 0 - 149 mg/dL   HDL 66 >14 mg/dL   VLDL Cholesterol Cal 22 5 - 40 mg/dL   LDL Chol Calc (NIH) 782 (H) 0 - 99 mg/dL   Chol/HDL Ratio 3.2 0.0 - 4.4 ratio      Assessment & Plan:   Problem List Items Addressed This Visit   None     Follow up plan: No follow-ups on file.   A total of 30 minutes were spent on this encounter today.  When total time is documented, this includes both the face-to-face and non-face-to-face time personally spent before, during and after the visit on the date of the encounter reviewing ASCVD risk score, pelvic floor physical therapy, blood pressure and labs.

## 2023-01-11 ENCOUNTER — Ambulatory Visit (INDEPENDENT_AMBULATORY_CARE_PROVIDER_SITE_OTHER): Payer: Medicare HMO | Admitting: Nurse Practitioner

## 2023-01-11 ENCOUNTER — Encounter: Payer: Self-pay | Admitting: Nurse Practitioner

## 2023-01-11 VITALS — BP 137/83 | HR 64 | Temp 97.6°F | Wt 114.0 lb

## 2023-01-11 DIAGNOSIS — Z0000 Encounter for general adult medical examination without abnormal findings: Secondary | ICD-10-CM | POA: Diagnosis not present

## 2023-01-11 DIAGNOSIS — E785 Hyperlipidemia, unspecified: Secondary | ICD-10-CM

## 2023-01-11 DIAGNOSIS — E782 Mixed hyperlipidemia: Secondary | ICD-10-CM | POA: Diagnosis not present

## 2023-01-11 DIAGNOSIS — Z1211 Encounter for screening for malignant neoplasm of colon: Secondary | ICD-10-CM

## 2023-01-11 DIAGNOSIS — F339 Major depressive disorder, recurrent, unspecified: Secondary | ICD-10-CM

## 2023-01-11 DIAGNOSIS — Z780 Asymptomatic menopausal state: Secondary | ICD-10-CM | POA: Diagnosis not present

## 2023-01-11 DIAGNOSIS — Z7189 Other specified counseling: Secondary | ICD-10-CM | POA: Diagnosis not present

## 2023-01-11 DIAGNOSIS — I10 Essential (primary) hypertension: Secondary | ICD-10-CM

## 2023-01-11 DIAGNOSIS — Z1231 Encounter for screening mammogram for malignant neoplasm of breast: Secondary | ICD-10-CM

## 2023-01-11 LAB — URINALYSIS, ROUTINE W REFLEX MICROSCOPIC
Bilirubin, UA: NEGATIVE
Glucose, UA: NEGATIVE
Ketones, UA: NEGATIVE
Leukocytes,UA: NEGATIVE
Nitrite, UA: NEGATIVE
Protein,UA: NEGATIVE
RBC, UA: NEGATIVE
Specific Gravity, UA: 1.015 (ref 1.005–1.030)
Urobilinogen, Ur: 0.2 mg/dL (ref 0.2–1.0)
pH, UA: 7.5 (ref 5.0–7.5)

## 2023-01-11 MED ORDER — VENLAFAXINE HCL ER 150 MG PO CP24: ORAL_CAPSULE | ORAL | 1 refills | Status: DC

## 2023-01-11 MED ORDER — OXYBUTYNIN CHLORIDE ER 10 MG PO TB24: ORAL_TABLET | ORAL | 1 refills | Status: DC

## 2023-01-11 MED ORDER — LISINOPRIL 40 MG PO TABS: ORAL_TABLET | ORAL | 1 refills | Status: DC

## 2023-01-11 MED ORDER — VENLAFAXINE HCL ER 75 MG PO CP24: 75.0000 mg | ORAL_CAPSULE | Freq: Every day | ORAL | 1 refills | Status: DC

## 2023-01-11 MED ORDER — ROSUVASTATIN CALCIUM 10 MG PO TABS: ORAL_TABLET | ORAL | 1 refills | Status: DC

## 2023-01-11 MED ORDER — HYDROCHLOROTHIAZIDE 12.5 MG PO CAPS: ORAL_CAPSULE | ORAL | 1 refills | Status: DC

## 2023-01-11 NOTE — Assessment & Plan Note (Signed)
Chronic.  Controlled.  Continue with current medications of Lisinopril and hydrochlorothiazide.  Refills sent today.  Follow up in 6 months.  Call sooner if concerns arise.

## 2023-01-11 NOTE — Assessment & Plan Note (Signed)
Chronic.  Controlled.  Continue with rosuvastatin.  Discussed ASCVD risk score of 11% with patient during visit.  Refills sent today.  Labs ordered today.  Return to clinic in 6 months for reevaluation.  Call sooner if concerns arise.

## 2023-01-11 NOTE — Assessment & Plan Note (Signed)
Chronic. Controlled on Effexor 225mg  daily.  Follow up in 6 months.  Call sooner if concerns arise.

## 2023-01-11 NOTE — Assessment & Plan Note (Signed)
A voluntary discussion about advance care planning including the explanation and discussion of advance directives was extensively discussed  with the patient for 5 minutes with patient.  Explanation about the health care proxy and Living will was reviewed and packet with forms with explanation of how to fill them out was given.  During this discussion, the patient was able to identify a health care proxy as her son and does not wish to make any changes.

## 2023-01-11 NOTE — Progress Notes (Signed)
BP 137/83   Pulse 64   Temp 97.6 F (36.4 C) (Oral)   Wt 114 lb (51.7 kg)   SpO2 99%   BMI 21.40 kg/m    Subjective:    Patient ID: Eileen Stewart, female    DOB: 06-10-1950, 72 y.o.   MRN: [id number]  HPI: Eileen Stewart is a 72 y.o. female presenting on 01/11/2023 for comprehensive medical examination. Current medical complaints include:none  She currently lives with: Menopausal Symptoms: no  HYPERTENSION / HYPERLIPIDEMIA Satisfied with current treatment? yes Duration of hypertension: years BP monitoring frequency: not checking BP range:  BP medication side effects: no Past BP meds: lisinopril-HCTZ Duration of hyperlipidemia: years Cholesterol medication side effects: no Cholesterol supplements: none Past cholesterol medications: rosuvastatin (crestor) Medication compliance: excellent compliance Aspirin: no Recent stressors: no Recurrent headaches: no Visual changes: no Palpitations: no Dyspnea: no Chest pain: no Lower extremity edema: no Dizzy/lightheaded: no  The 10-year ASCVD risk score (Arnett DK, et al., 2019) is: 11.6%   Values used to calculate the score:     Age: 5 years     Sex: Female     Is Non-Hispanic African American: No     Diabetic: No     Tobacco smoker: No     Systolic Blood Pressure: 124 mmHg     Is BP treated: Yes     HDL Cholesterol: 66 mg/dL     Total Cholesterol: 208 mg/dL    DEPRESSION Patient states she feels like she is doing well.  She still feels like her brain is all over the place.  It has worsened over the last year.  She states she can read something and understand it.     Functional Status Survey: Is the patient deaf or have difficulty hearing?: No Does the patient have difficulty seeing, even when wearing glasses/contacts?: No Does the patient have difficulty concentrating, remembering, or making decisions?: No Does the patient have difficulty walking or climbing stairs?: No Does the patient have difficulty  dressing or bathing?: No Does the patient have difficulty doing errands alone such as visiting a doctor's office or shopping?: No     01/11/2023    9:14 AM 06/26/2022    1:31 PM 01/08/2022   12:44 PM 08/03/2021   10:23 AM 07/04/2021   10:29 AM  Fall Risk   Falls in the past year? 0 0 0 0 0  Number falls in past yr: 0 0 0 0 0  Injury with Fall? 0 0 0 0 0  Risk for fall due to : No Fall Risks No Fall Risks  No Fall Risks No Fall Risks  Follow up Falls evaluation completed Falls evaluation completed Falls evaluation completed;Education provided;Falls prevention discussed Falls evaluation completed Falls evaluation completed    Depression Screen    01/11/2023    9:18 AM 01/11/2023    9:14 AM 07/26/2022   12:00 PM 06/26/2022    1:32 PM 01/08/2022   12:52 PM  Depression screen PHQ 2/9  Decreased Interest 0 0 1 1 1   Down, Depressed, Hopeless 0 0 0 0 0  PHQ - 2 Score 0 0 1 1 1   Altered sleeping 0  1 1 0  Tired, decreased energy 0  0 0 0  Change in appetite 0  0 0 0  Feeling bad or failure about yourself  0  0 0 0  Trouble concentrating 0  3 3 0  Moving slowly or fidgety/restless 0  0 0 0  Suicidal thoughts 0  0 0 0  PHQ-9 Score 0  5 5 1   Difficult doing work/chores Not difficult at all  Somewhat difficult Somewhat difficult Not difficult at all     Advanced Directives Does patient have a HCPOA?    yes If yes, name and contact information: Her Son Does patient have a living will or MOST form?  yes  Past Medical History:  Past Medical History:  Diagnosis Date   Acute pyelonephritis    Alcohol abuse    Alcohol, remission since 2013   Anemia    Asymptomatic varicose veins, unspecified laterality    Depression    Essential tremor    Hyperlipidemia    Hypertension    Liver mass    Vitamin D deficiency     Surgical History:  Past Surgical History:  Procedure Laterality Date   SPLENECTOMY      Medications:  Current Outpatient Medications on File Prior to Visit   Medication Sig   ASPIRIN LOW DOSE 81 MG EC tablet TAKE 1 TABLET(81 MG) BY MOUTH DAILY   No current facility-administered medications on file prior to visit.    Allergies:  No Known Allergies  Social History:  Social History   Socioeconomic History   Marital status: Divorced    Spouse name: Not on file   Number of children: Not on file   Years of education: Not on file   Highest education level: Associate degree: academic program  Occupational History   Occupation: retired  Tobacco Use   Smoking status: Never   Smokeless tobacco: Never  Vaping Use   Vaping status: Never Used  Substance and Sexual Activity   Alcohol use: Yes    Comment: 3 beers a month on average    Drug use: No   Sexual activity: Not Currently  Other Topics Concern   Not on file  Social History Narrative   Not on file   Social Determinants of Health   Financial Resource Strain: Low Risk  (01/11/2023)   Overall Financial Resource Strain (CARDIA)    Difficulty of Paying Living Expenses: Not hard at all  Food Insecurity: No Food Insecurity (01/11/2023)   Hunger Vital Sign    Worried About Running Out of Food in the Last Year: Never true    Ran Out of Food in the Last Year: Never true  Transportation Needs: No Transportation Needs (01/11/2023)   PRAPARE - Administrator, Civil Service (Medical): No    Lack of Transportation (Non-Medical): No  Physical Activity: Inactive (01/08/2022)   Exercise Vital Sign    Days of Exercise per Week: 0 days    Minutes of Exercise per Session: 0 min  Stress: No Stress Concern Present (01/08/2022)   [employer] of Occupational Health - Occupational Stress Questionnaire    Feeling of Stress : Not at all  Social Connections: Moderately Integrated (01/11/2023)   Social Connection and Isolation Panel [NHANES]    Frequency of Communication with Friends and Family: Twice a week    Frequency of Social Gatherings with Friends and Family: Twice a week     Attends Religious Services: More than 4 times per year    Active Member of [employer] or Organizations: Yes    Attends Engineer, structural: More than 4 times per year    Marital Status: Divorced  Intimate Partner Violence: Not At Risk (01/11/2023)   Humiliation, Afraid, Rape, and Kick questionnaire    Fear of Current or Ex-Partner: No    Emotionally Abused:  No    Physically Abused: No    Sexually Abused: No   Social History   Tobacco Use  Smoking Status Never  Smokeless Tobacco Never   Social History   Substance and Sexual Activity  Alcohol Use Yes   Comment: 3 beers a month on average     Family History:  Family History  Problem Relation Age of Onset   Hyperlipidemia Mother    Hypertension Mother    Alcohol abuse Father    Depression Sister    Anxiety disorder Sister    Parkinson's disease Cousin     Past medical history, surgical history, medications, allergies, family history and social history reviewed with patient today and changes made to appropriate areas of the chart.   Review of Systems  Eyes:  Negative for blurred vision and double vision.  Respiratory:  Negative for shortness of breath.   Cardiovascular:  Negative for chest pain, palpitations and leg swelling.  Neurological:  Negative for dizziness and headaches.  Psychiatric/Behavioral:  Positive for depression. Negative for suicidal ideas.     All other ROS negative except what is listed above and in the HPI.      Objective:    BP 137/83   Pulse 64   Temp 97.6 F (36.4 C) (Oral)   Wt 114 lb (51.7 kg)   SpO2 99%   BMI 21.40 kg/m   Wt Readings from Last 3 Encounters:  01/11/23 114 lb (51.7 kg)  07/26/22 115 lb 4.8 oz (52.3 kg)  06/26/22 114 lb 8 oz (51.9 kg)    No results found.  Physical Exam Vitals and nursing note reviewed.  Constitutional:      General: She is awake. She is not in acute distress.    Appearance: Normal appearance. She is well-developed and normal weight. She is  not ill-appearing.  HENT:     Head: Normocephalic and atraumatic.     Right Ear: Hearing, tympanic membrane, ear canal and external ear normal. No drainage.     Left Ear: Hearing, tympanic membrane, ear canal and external ear normal. No drainage.     Nose: Nose normal.     Right Sinus: No maxillary sinus tenderness or frontal sinus tenderness.     Left Sinus: No maxillary sinus tenderness or frontal sinus tenderness.     Mouth/Throat:     Mouth: Mucous membranes are moist.     Pharynx: Oropharynx is clear. Uvula midline. No pharyngeal swelling, oropharyngeal exudate or posterior oropharyngeal erythema.  Eyes:     General: Lids are normal.        Right eye: No discharge.        Left eye: No discharge.     Extraocular Movements: Extraocular movements intact.     Conjunctiva/sclera: Conjunctivae normal.     Pupils: Pupils are equal, round, and reactive to light.     Visual Fields: Right eye visual fields normal and left eye visual fields normal.  Neck:     Thyroid: No thyromegaly.     Vascular: No carotid bruit.     Trachea: Trachea normal.  Cardiovascular:     Rate and Rhythm: Normal rate and regular rhythm.     Heart sounds: Normal heart sounds. No murmur heard.    No gallop.  Pulmonary:     Effort: Pulmonary effort is normal. No accessory muscle usage or respiratory distress.     Breath sounds: Normal breath sounds.  Chest:  Breasts:    Right: Normal.  Left: Normal.  Abdominal:     General: Bowel sounds are normal.     Palpations: Abdomen is soft. There is no hepatomegaly or splenomegaly.     Tenderness: There is no abdominal tenderness.  Musculoskeletal:        General: Normal range of motion.     Cervical back: Normal range of motion and neck supple.     Right lower leg: No edema.     Left lower leg: No edema.  Lymphadenopathy:     Head:     Right side of head: No submental, submandibular, tonsillar, preauricular or posterior auricular adenopathy.     Left side of  head: No submental, submandibular, tonsillar, preauricular or posterior auricular adenopathy.     Cervical: No cervical adenopathy.     Upper Body:     Right upper body: No supraclavicular, axillary or pectoral adenopathy.     Left upper body: No supraclavicular, axillary or pectoral adenopathy.  Skin:    General: Skin is warm and dry.     Capillary Refill: Capillary refill takes less than 2 seconds.     Findings: No rash.  Neurological:     Mental Status: She is alert and oriented to person, place, and time.     Gait: Gait is intact.  Psychiatric:        Attention and Perception: Attention normal.        Mood and Affect: Mood normal.        Speech: Speech normal.        Behavior: Behavior normal. Behavior is cooperative.        Thought Content: Thought content normal.        Judgment: Judgment normal.        01/11/2023    9:16 AM 01/08/2022   12:49 PM 01/02/2021    3:42 PM 01/01/2020    3:23 PM 12/23/2017   11:05 AM  6CIT Screen  What Year? 0 points 0 points 0 points 0 points 4 points  What month? 0 points 0 points 0 points 0 points 3 points  What time? 0 points 0 points 0 points 0 points 3 points  Count back from 20 0 points 0 points 0 points 0 points 4 points  Months in reverse 0 points 0 points 0 points 0 points 4 points  Repeat phrase 0 points 0 points 0 points 0 points 8 points  Total Score 0 points 0 points 0 points 0 points 26 points     Results for orders placed or performed in visit on 06/26/22  Comp Met (CMET)  Result Value Ref Range   Glucose 82 70 - 99 mg/dL   BUN 9 8 - 27 mg/dL   Creatinine, Ser 6.04 0.57 - 1.00 mg/dL   eGFR 90 >54 UJ/WJX/9.14   BUN/Creatinine Ratio 13 12 - 28   Sodium 133 (L) 134 - 144 mmol/L   Potassium 4.1 3.5 - 5.2 mmol/L   Chloride 92 (L) 96 - 106 mmol/L   CO2 22 20 - 29 mmol/L   Calcium 9.7 8.7 - 10.3 mg/dL   Total Protein 7.0 6.0 - 8.5 g/dL   Albumin 4.6 3.9 - 4.9 g/dL   Globulin, Total 2.4 1.5 - 4.5 g/dL   Albumin/Globulin  Ratio 1.9 1.2 - 2.2   Bilirubin Total 0.4 0.0 - 1.2 mg/dL   Alkaline Phosphatase 80 44 - 121 IU/L   AST 22 0 - 40 IU/L   ALT 18 0 - 32 IU/L  Lipid Profile  Result Value Ref Range   Cholesterol, Total 208 (H) 100 - 199 mg/dL   Triglycerides 295 0 - 149 mg/dL   HDL 66 >62 mg/dL   VLDL Cholesterol Cal 22 5 - 40 mg/dL   LDL Chol Calc (NIH) 130 (H) 0 - 99 mg/dL   Chol/HDL Ratio 3.2 0.0 - 4.4 ratio      Assessment & Plan:   Problem List Items Addressed This Visit       Cardiovascular and Mediastinum   Essential hypertension    Chronic.  Controlled.  Continue with current medications of Lisinopril and hydrochlorothiazide.  Refills sent today.  Follow up in 6 months.  Call sooner if concerns arise.       Relevant Medications   hydrochlorothiazide (MICROZIDE) 12.5 MG capsule   lisinopril (ZESTRIL) 40 MG tablet   rosuvastatin (CRESTOR) 10 MG tablet     Other   Hyperlipidemia    Chronic.  Controlled.  Continue with rosuvastatin.  Discussed ASCVD risk score of 11% with patient during visit.  Refills sent today.  Labs ordered today.  Return to clinic in 6 months for reevaluation.  Call sooner if concerns arise.         Relevant Medications   hydrochlorothiazide (MICROZIDE) 12.5 MG capsule   lisinopril (ZESTRIL) 40 MG tablet   rosuvastatin (CRESTOR) 10 MG tablet   Other Relevant Orders   Lipid panel   Major depression, recurrent, chronic (HCC)    Chronic. Controlled on Effexor 225mg  daily.  Follow up in 6 months.  Call sooner if concerns arise.       Relevant Medications   venlafaxine XR (EFFEXOR XR) 75 MG 24 hr capsule   venlafaxine XR (EFFEXOR-XR) 150 MG 24 hr capsule   Advanced care planning/counseling discussion    A voluntary discussion about advance care planning including the explanation and discussion of advance directives was extensively discussed  with the patient for 5 minutes with patient.  Explanation about the health care proxy and Living will was reviewed and  packet with forms with explanation of how to fill them out was given.  During this discussion, the patient was able to identify a health care proxy as her son and does not wish to make any changes.      Other Visit Diagnoses     Encounter for Medicare annual wellness exam    -  Primary   Annual physical exam       Health maintenance reviewed during visit today.  Labs ordered.  Vaccines reviewed.   Relevant Orders   CBC with Differential/Platelet   Comprehensive metabolic panel   Lipid panel   TSH   Urinalysis, Routine w reflex microscopic   Encounter for screening mammogram for malignant neoplasm of breast       Relevant Orders   MM 3D SCREENING MAMMOGRAM BILATERAL BREAST   Post-menopausal       Relevant Orders   DG Bone Density   Screening for colon cancer       Relevant Orders   Cologuard        Preventative Services:  AAA screening: NA Health Risk Assessment and Personalized Prevention Plan: Up to date Bone Mass Measurements: Ordered today Breast Cancer Screening: Ordered today CVD Screening: Up to date Cervical Cancer Screening: NA Colon Cancer Screening: Ordered today Depression Screening: Done today Diabetes Screening: Done today Glaucoma Screening: Up to date Hepatitis B vaccine: NA Hepatitis C screening: Up to date HIV Screening: Up to date Flu Vaccine: Refused Lung cancer  Screening: NA Obesity Screening: Done today Pneumonia Vaccines (2): Up to date STI Screening: NA  Follow up plan: Return in about 6 months (around 07/11/2023) for HTN, HLD, DM2 FU.   LABORATORY TESTING:  - Pap smear: not applicable  IMMUNIZATIONS:   - Tdap: Tetanus vaccination status reviewed: last tetanus booster within 10 years. - Influenza: Refused - Pneumovax: Up to date - Prevnar: Up to date - Zostavax vaccine:  Discussed at visit today  SCREENING: -Mammogram: Ordered today  - Colonoscopy: Ordered today  - Bone Density: Ordered today  -Hearing Test: Not applicable   -Spirometry: Not applicable   PATIENT COUNSELING:   Advised to take 1 mg of folate supplement per day if capable of pregnancy.   Sexuality: Discussed sexually transmitted diseases, partner selection, use of condoms, avoidance of unintended pregnancy  and contraceptive alternatives.   Advised to avoid cigarette smoking.  I discussed with the patient that most people either abstain from alcohol or drink within safe limits (<=14/week and <=4 drinks/occasion for males, <=7/weeks and <= 3 drinks/occasion for females) and that the risk for alcohol disorders and other health effects rises proportionally with the number of drinks per week and how often a drinker exceeds daily limits.  Discussed cessation/primary prevention of drug use and availability of treatment for abuse.   Diet: Encouraged to adjust caloric intake to maintain  or achieve ideal body weight, to reduce intake of dietary saturated fat and total fat, to limit sodium intake by avoiding high sodium foods and not adding table salt, and to maintain adequate dietary potassium and calcium preferably from fresh fruits, vegetables, and low-fat dairy products.    stressed the importance of regular exercise  Injury prevention: Discussed safety belts, safety helmets, smoke detector, smoking near bedding or upholstery.   Dental health: Discussed importance of regular tooth brushing, flossing, and dental visits.    NEXT PREVENTATIVE PHYSICAL DUE IN 1 YEAR. Return in about 6 months (around 07/11/2023) for HTN, HLD, DM2 FU.

## 2023-01-11 NOTE — Patient Instructions (Signed)
  Eileen Stewart , Thank you for taking time to come for your Medicare Wellness Visit. I appreciate your ongoing commitment to your health goals. Please review the following plan we discussed and let me know if I can assist you in the future.   These are the goals we discussed:  Goals      Increase physical activity     Patient Stated     01/01/2020, no goals     Patient Stated     01/02/2021, wants to start walking        This is a list of the screening recommended for you and due dates:  Health Maintenance  Topic Date Due   Colon Cancer Screening  Never done   Zoster (Shingles) Vaccine (1 of 2) Never done   DEXA scan (bone density measurement)  Never done   Medicare Annual Wellness Visit  01/09/2023   COVID-19 Vaccine (4 - 2023-24 season) 01/27/2023*   Flu Shot  06/10/2023*   Mammogram  07/26/2023*   Pneumonia Vaccine (3 of 3 - PPSV23 or PCV20) 06/29/2025   DTaP/Tdap/Td vaccine (3 - Td or Tdap) 03/07/2029   Hepatitis C Screening  Completed   HPV Vaccine  Aged Out  *Topic was postponed. The date shown is not the original due date.

## 2023-01-12 LAB — COMPREHENSIVE METABOLIC PANEL
ALT: 18 [IU]/L (ref 0–32)
AST: 26 [IU]/L (ref 0–40)
Albumin: 4.6 g/dL (ref 3.9–4.9)
Alkaline Phosphatase: 81 [IU]/L (ref 44–121)
BUN/Creatinine Ratio: 14 (ref 12–28)
BUN: 10 mg/dL (ref 8–27)
Bilirubin Total: 0.5 mg/dL (ref 0.0–1.2)
CO2: 25 mmol/L (ref 20–29)
Calcium: 10 mg/dL (ref 8.7–10.3)
Chloride: 95 mmol/L — ABNORMAL LOW (ref 96–106)
Creatinine, Ser: 0.73 mg/dL (ref 0.57–1.00)
Globulin, Total: 2.4 g/dL (ref 1.5–4.5)
Glucose: 87 mg/dL (ref 70–99)
Potassium: 4.9 mmol/L (ref 3.5–5.2)
Sodium: 133 mmol/L — ABNORMAL LOW (ref 134–144)
Total Protein: 7 g/dL (ref 6.0–8.5)
eGFR: 88 mL/min/{1.73_m2} (ref >59)

## 2023-01-12 LAB — CBC WITH DIFFERENTIAL/PLATELET
Basophils Absolute: 0 10*3/uL (ref 0.0–0.2)
Basos: 1 %
EOS (ABSOLUTE): 0.3 10*3/uL (ref 0.0–0.4)
Eos: 6 %
Hematocrit: 46.4 % (ref 34.0–46.6)
Hemoglobin: 15.1 g/dL (ref 11.1–15.9)
Immature Grans (Abs): 0 10*3/uL (ref 0.0–0.1)
Immature Granulocytes: 0 %
Lymphocytes Absolute: 1.3 10*3/uL (ref 0.7–3.1)
Lymphs: 27 %
MCH: 29.2 pg (ref 26.6–33.0)
MCHC: 32.5 g/dL (ref 31.5–35.7)
MCV: 90 fL (ref 79–97)
Monocytes Absolute: 0.6 10*3/uL (ref 0.1–0.9)
Monocytes: 12 %
Neutrophils Absolute: 2.5 10*3/uL (ref 1.4–7.0)
Neutrophils: 54 %
Platelets: 323 10*3/uL (ref 150–450)
RBC: 5.17 x10E6/uL (ref 3.77–5.28)
RDW: 12.7 % (ref 11.7–15.4)
WBC: 4.7 10*3/uL (ref 3.4–10.8)

## 2023-01-12 LAB — LIPID PANEL
Chol/HDL Ratio: 2 ratio (ref 0.0–4.4)
Cholesterol, Total: 170 mg/dL (ref 100–199)
HDL: 85 mg/dL (ref >39)
LDL Chol Calc (NIH): 74 mg/dL (ref 0–99)
Triglycerides: 53 mg/dL (ref 0–149)
VLDL Cholesterol Cal: 11 mg/dL (ref 5–40)

## 2023-01-12 LAB — TSH: TSH: 2.23 u[IU]/mL (ref 0.450–4.500)

## 2023-01-15 ENCOUNTER — Telehealth: Payer: Self-pay | Admitting: Nurse Practitioner

## 2023-01-15 NOTE — Telephone Encounter (Signed)
Attempted to make appt did not have the  answers to the question asked. Routing to clinical to schedule appt.

## 2023-01-15 NOTE — Telephone Encounter (Signed)
-----   Message from [facility] Destiny K sent at 01/11/2023 10:42 AM EDT ----- Can we call to get this patient scheduled for Mammogram and Bone Density at [phone]. Please advise? ----- Message ----- From: Larae Grooms, NP Sent: 01/11/2023   9:42 AM EDT To: [facility]  Can we schedule her mammogram and Dexa?

## 2023-01-15 NOTE — Telephone Encounter (Signed)
Reached out to patient via MyChart to make her aware of scheduled appointment with [facility] at [facility] location.

## 2023-01-16 ENCOUNTER — Telehealth: Payer: Self-pay | Admitting: Nurse Practitioner

## 2023-01-16 NOTE — Telephone Encounter (Signed)
-----   Message from [facility] Destiny K sent at 01/11/2023 10:42 AM EDT ----- Can we call to get this patient scheduled for Mammogram and Bone Density at [phone]. Please advise? ----- Message ----- From: Larae Grooms, NP Sent: 01/11/2023   9:42 AM EDT To: [facility]  Can we schedule her mammogram and Dexa?

## 2023-01-17 ENCOUNTER — Other Ambulatory Visit: Payer: Self-pay

## 2023-01-24 DIAGNOSIS — R300 Dysuria: Secondary | ICD-10-CM | POA: Diagnosis not present

## 2023-01-25 DIAGNOSIS — R300 Dysuria: Secondary | ICD-10-CM | POA: Diagnosis not present

## 2023-04-30 LAB — COLOGUARD

## 2023-07-11 ENCOUNTER — Ambulatory Visit: Payer: Self-pay | Admitting: Nurse Practitioner

## 2023-07-23 ENCOUNTER — Other Ambulatory Visit: Payer: Self-pay | Admitting: Nurse Practitioner

## 2023-07-24 NOTE — Telephone Encounter (Signed)
 Patient is overdue for an appointment. Please call patient to schedule and then route to provider for refill.

## 2023-07-25 NOTE — Telephone Encounter (Signed)
 Appt schedule 07-29-23

## 2023-07-29 ENCOUNTER — Ambulatory Visit: Admitting: Nurse Practitioner

## 2023-07-29 NOTE — Progress Notes (Deleted)
 There were no vitals taken for this visit.   Subjective:    Patient ID: Eileen Stewart, female    DOB: 21-Jul-1950, 73 y.o.   MRN: [id number]  HPI: Eileen Stewart is a 73 y.o. female  No chief complaint on file.  HYPERTENSION / HYPERLIPIDEMIA Satisfied with current treatment? yes Duration of hypertension: years BP monitoring frequency: not checking BP range:  BP medication side effects: no Past BP meds: lisinopril -HCTZ Duration of hyperlipidemia: years Cholesterol medication side effects: no Cholesterol supplements: none Past cholesterol medications: rosuvastatin  (crestor ) Medication compliance: excellent compliance Aspirin : no Recent stressors: no Recurrent headaches: no Visual changes: no Palpitations: no Dyspnea: no Chest pain: no Lower extremity edema: no Dizzy/lightheaded: no  The 10-year ASCVD risk score (Arnett DK, et al., 2019) is: 11.6%   Values used to calculate the score:     Age: 57 years     Sex: Female     Is Non-Hispanic African American: No     Diabetic: No     Tobacco smoker: No     Systolic Blood Pressure: 124 mmHg     Is BP treated: Yes     HDL Cholesterol: 66 mg/dL     Total Cholesterol: 208 mg/dL    DEPRESSION Patient states she feels like she is doing well.  She still feels like her brain is all over the place.  It has worsened over the last year.  She states she can read something and understand it.    Relevant past medical, surgical, family and social history reviewed and updated as indicated. Interim medical history since our last visit reviewed. Allergies and medications reviewed and updated.  Review of Systems  Per HPI unless specifically indicated above     Objective:     There were no vitals taken for this visit.  Wt Readings from Last 3 Encounters:  01/11/23 114 lb (51.7 kg)  07/26/22 115 lb 4.8 oz (52.3 kg)  06/26/22 114 lb 8 oz (51.9 kg)    Physical Exam  Results for orders placed or performed in visit on 01/11/23   Urinalysis, Routine w reflex microscopic   Collection Time: 01/11/23  9:32 AM  Result Value Ref Range   Specific Gravity, UA 1.015 1.005 - 1.030   pH, UA 7.5 5.0 - 7.5   Color, UA Yellow Yellow   Appearance Ur Clear Clear   Leukocytes,UA Negative Negative   Protein,UA Negative Negative/Trace   Glucose, UA Negative Negative   Ketones, UA Negative Negative   RBC, UA Negative Negative   Bilirubin, UA Negative Negative   Urobilinogen, Ur 0.2 0.2 - 1.0 mg/dL   Nitrite, UA Negative Negative   Microscopic Examination Comment   CBC with Differential/Platelet   Collection Time: 01/11/23  9:33 AM  Result Value Ref Range   WBC 4.7 3.4 - 10.8 x10E3/uL   RBC 5.17 3.77 - 5.28 x10E6/uL   Hemoglobin 15.1 11.1 - 15.9 g/dL   Hematocrit 08.6 57.8 - 46.6 %   MCV 90 79 - 97 fL   MCH 29.2 26.6 - 33.0 pg   MCHC 32.5 31.5 - 35.7 g/dL   RDW 46.9 62.9 - 52.8 %   Platelets 323 150 - 450 x10E3/uL   Neutrophils 54 Not Estab. %   Lymphs 27 Not Estab. %   Monocytes 12 Not Estab. %   Eos 6 Not Estab. %   Basos 1 Not Estab. %   Neutrophils Absolute 2.5 1.4 - 7.0 x10E3/uL   Lymphocytes Absolute 1.3 0.7 -  3.1 x10E3/uL   Monocytes Absolute 0.6 0.1 - 0.9 x10E3/uL   EOS (ABSOLUTE) 0.3 0.0 - 0.4 x10E3/uL   Basophils Absolute 0.0 0.0 - 0.2 x10E3/uL   Immature Granulocytes 0 Not Estab. %   Immature Grans (Abs) 0.0 0.0 - 0.1 x10E3/uL  Comprehensive metabolic panel   Collection Time: 01/11/23  9:33 AM  Result Value Ref Range   Glucose 87 70 - 99 mg/dL   BUN 10 8 - 27 mg/dL   Creatinine, Ser 0.45 0.57 - 1.00 mg/dL   eGFR 88 >40 JW/JXB/1.47   BUN/Creatinine Ratio 14 12 - 28   Sodium 133 (L) 134 - 144 mmol/L   Potassium 4.9 3.5 - 5.2 mmol/L   Chloride 95 (L) 96 - 106 mmol/L   CO2 25 20 - 29 mmol/L   Calcium  10.0 8.7 - 10.3 mg/dL   Total Protein 7.0 6.0 - 8.5 g/dL   Albumin 4.6 3.9 - 4.9 g/dL   Globulin, Total 2.4 1.5 - 4.5 g/dL   Bilirubin Total 0.5 0.0 - 1.2 mg/dL   Alkaline Phosphatase 81 44 - 121  IU/L   AST 26 0 - 40 IU/L   ALT 18 0 - 32 IU/L  Lipid panel   Collection Time: 01/11/23  9:33 AM  Result Value Ref Range   Cholesterol, Total 170 100 - 199 mg/dL   Triglycerides 53 0 - 149 mg/dL   HDL 85 >82 mg/dL   VLDL Cholesterol Cal 11 5 - 40 mg/dL   LDL Chol Calc (NIH) 74 0 - 99 mg/dL   Chol/HDL Ratio 2.0 0.0 - 4.4 ratio  TSH   Collection Time: 01/11/23  9:33 AM  Result Value Ref Range   TSH 2.230 0.450 - 4.500 uIU/mL  Cologuard   Collection Time: 04/15/23  8:30 AM  Result Value Ref Range   COLOGUARD No Result Obtained 3 N/A      Assessment & Plan:   Problem List Items Addressed This Visit       Cardiovascular and Mediastinum   Essential hypertension     Other   Hyperlipidemia - Primary   Major depression, recurrent, chronic (HCC)     Follow up plan: No follow-ups on file.

## 2023-08-12 ENCOUNTER — Encounter: Payer: Self-pay | Admitting: Nurse Practitioner

## 2023-08-12 ENCOUNTER — Ambulatory Visit (INDEPENDENT_AMBULATORY_CARE_PROVIDER_SITE_OTHER): Admitting: Nurse Practitioner

## 2023-08-12 VITALS — BP 166/84 | HR 69 | Ht 61.0 in | Wt 113.0 lb

## 2023-08-12 DIAGNOSIS — E782 Mixed hyperlipidemia: Secondary | ICD-10-CM

## 2023-08-12 DIAGNOSIS — N3946 Mixed incontinence: Secondary | ICD-10-CM | POA: Diagnosis not present

## 2023-08-12 DIAGNOSIS — I10 Essential (primary) hypertension: Secondary | ICD-10-CM

## 2023-08-12 DIAGNOSIS — G250 Essential tremor: Secondary | ICD-10-CM

## 2023-08-12 DIAGNOSIS — F339 Major depressive disorder, recurrent, unspecified: Secondary | ICD-10-CM

## 2023-08-12 MED ORDER — BUPROPION HCL ER (XL) 150 MG PO TB24: 150.0000 mg | ORAL_TABLET | Freq: Every day | ORAL | 0 refills | Status: DC

## 2023-08-12 MED ORDER — OLMESARTAN MEDOXOMIL 40 MG PO TABS: 40.0000 mg | ORAL_TABLET | Freq: Every day | ORAL | 0 refills | Status: DC

## 2023-08-12 NOTE — Assessment & Plan Note (Signed)
 Chronic. Not well controlled.  Has been on Wellbutrin  in the past with Venlafaxine .  Will restart Wellbutrin .  Continue with Venlaxafine.  Follow up in 1 month.  Call sooner if concerns.

## 2023-08-12 NOTE — Assessment & Plan Note (Signed)
 Hs not followed by with Neurology.  No change at this time.

## 2023-08-12 NOTE — Assessment & Plan Note (Signed)
 Chronic.  Controlled.  Continue with current medication regimen of rosuvastatin.  Labs ordered today.  Return to clinic in 6 months for reevaluation.  Call sooner if concerns arise.

## 2023-08-12 NOTE — Assessment & Plan Note (Addendum)
 Chronic.  Controlled.  Continue with current medication regimen.  Labs ordered today.  Return to clinic in 6 months for reevaluation.  Call sooner if concerns arise.  ? ?

## 2023-08-12 NOTE — Progress Notes (Signed)
 BP (!) 166/84   Pulse 69   Ht 5\' 1"  (1.549 m)   Wt 113 lb (51.3 kg)   SpO2 98%   BMI 21.35 kg/m    Subjective:    Patient ID: Eileen Stewart, female    DOB: 05-Jun-1950, 73 y.o.   MRN: [id number]  HPI: Eileen Stewart is a 73 y.o. female  Chief Complaint  Patient presents with   Medical Management of Chronic Issues   HYPERTENSION / HYPERLIPIDEMIA Satisfied with current treatment? yes Duration of hypertension: years BP monitoring frequency: not checking BP range:  BP medication side effects: no Past BP meds: lisinopril -HCTZ Duration of hyperlipidemia: years Cholesterol medication side effects: no Cholesterol supplements: none Past cholesterol medications: rosuvastatin  (crestor ) Medication compliance: excellent compliance Aspirin : no Recent stressors: no Recurrent headaches: no Visual changes: no Palpitations: no Dyspnea: no Chest pain: no Lower extremity edema: no Dizzy/lightheaded: no  The 10-year ASCVD risk score (Arnett DK, et al., 2019) is: 11.6%   Values used to calculate the score:     Age: 70 years     Sex: Female     Is Non-Hispanic African American: No     Diabetic: No     Tobacco smoker: No     Systolic Blood Pressure: 124 mmHg     Is BP treated: Yes     HDL Cholesterol: 66 mg/dL     Total Cholesterol: 208 mg/dL    DEPRESSION Patient states she feels like she is doing better.  She states that a few weeks ago she was not doing well mentally but has since come back to her baseline.   She feels like the venlafaxine  was working well in the past but not so much anymore.  She still feels like her brain is all over the place.  It has worsened over the last year.  She is sleeping too much and feels like she is over eating.      08/12/2023    2:18 PM 01/11/2023    9:18 AM 01/11/2023    9:14 AM  PHQ9 SCORE ONLY  PHQ-9 Total Score 12 0 0      08/12/2023    2:18 PM 01/11/2023    9:14 AM 06/26/2022    1:32 PM 08/03/2021   10:23 AM  GAD 7 : Generalized  Anxiety Score  Nervous, Anxious, on Edge 1 0 1 0  Control/stop worrying 0 0 0 0  Worry too much - different things 2 0 0 0  Trouble relaxing 0 0 0 0  Restless 1 0 1 0  Easily annoyed or irritable 1 0 0 0  Afraid - awful might happen 0 0 0 0  Total GAD 7 Score 5 0 2 0  Anxiety Difficulty Somewhat difficult Not difficult at all Somewhat difficult Not difficult at all      Relevant past medical, surgical, family and social history reviewed and updated as indicated. Interim medical history since our last visit reviewed. Allergies and medications reviewed and updated.  Review of Systems  Eyes:  Negative for visual disturbance.  Respiratory:  Negative for cough, chest tightness and shortness of breath.   Cardiovascular:  Negative for chest pain, palpitations and leg swelling.  Neurological:  Positive for tremors. Negative for dizziness and headaches.  Psychiatric/Behavioral:  Positive for dysphoric mood. Negative for suicidal ideas. The patient is nervous/anxious.     Per HPI unless specifically indicated above     Objective:     BP (!) 166/84   Pulse 69  Ht 5\' 1"  (1.549 m)   Wt 113 lb (51.3 kg)   SpO2 98%   BMI 21.35 kg/m   Wt Readings from Last 3 Encounters:  08/12/23 113 lb (51.3 kg)  01/11/23 114 lb (51.7 kg)  07/26/22 115 lb 4.8 oz (52.3 kg)    Physical Exam Vitals and nursing note reviewed.  Constitutional:      General: She is not in acute distress.    Appearance: Normal appearance. She is normal weight. She is not ill-appearing, toxic-appearing or diaphoretic.  HENT:     Head: Normocephalic.     Right Ear: External ear normal.     Left Ear: External ear normal.     Nose: Nose normal.     Mouth/Throat:     Mouth: Mucous membranes are moist.     Pharynx: Oropharynx is clear.  Eyes:     General:        Right eye: No discharge.        Left eye: No discharge.     Extraocular Movements: Extraocular movements intact.     Conjunctiva/sclera: Conjunctivae  normal.     Pupils: Pupils are equal, round, and reactive to light.  Cardiovascular:     Rate and Rhythm: Normal rate and regular rhythm.     Heart sounds: No murmur heard. Pulmonary:     Effort: Pulmonary effort is normal. No respiratory distress.     Breath sounds: Normal breath sounds. No wheezing or rales.  Musculoskeletal:     Cervical back: Normal range of motion and neck supple.  Skin:    General: Skin is warm and dry.     Capillary Refill: Capillary refill takes less than 2 seconds.  Neurological:     General: No focal deficit present.     Mental Status: She is alert and oriented to person, place, and time. Mental status is at baseline.  Psychiatric:        Mood and Affect: Mood normal.        Behavior: Behavior normal.        Thought Content: Thought content normal.        Judgment: Judgment normal.     Results for orders placed or performed in visit on 01/11/23  Urinalysis, Routine w reflex microscopic   Collection Time: 01/11/23  9:32 AM  Result Value Ref Range   Specific Gravity, UA 1.015 1.005 - 1.030   pH, UA 7.5 5.0 - 7.5   Color, UA Yellow Yellow   Appearance Ur Clear Clear   Leukocytes,UA Negative Negative   Protein,UA Negative Negative/Trace   Glucose, UA Negative Negative   Ketones, UA Negative Negative   RBC, UA Negative Negative   Bilirubin, UA Negative Negative   Urobilinogen, Ur 0.2 0.2 - 1.0 mg/dL   Nitrite, UA Negative Negative   Microscopic Examination Comment   CBC with Differential/Platelet   Collection Time: 01/11/23  9:33 AM  Result Value Ref Range   WBC 4.7 3.4 - 10.8 x10E3/uL   RBC 5.17 3.77 - 5.28 x10E6/uL   Hemoglobin 15.1 11.1 - 15.9 g/dL   Hematocrit 96.0 45.4 - 46.6 %   MCV 90 79 - 97 fL   MCH 29.2 26.6 - 33.0 pg   MCHC 32.5 31.5 - 35.7 g/dL   RDW 09.8 11.9 - 14.7 %   Platelets 323 150 - 450 x10E3/uL   Neutrophils 54 Not Estab. %   Lymphs 27 Not Estab. %   Monocytes 12 Not Estab. %   Eos  6 Not Estab. %   Basos 1 Not Estab.  %   Neutrophils Absolute 2.5 1.4 - 7.0 x10E3/uL   Lymphocytes Absolute 1.3 0.7 - 3.1 x10E3/uL   Monocytes Absolute 0.6 0.1 - 0.9 x10E3/uL   EOS (ABSOLUTE) 0.3 0.0 - 0.4 x10E3/uL   Basophils Absolute 0.0 0.0 - 0.2 x10E3/uL   Immature Granulocytes 0 Not Estab. %   Immature Grans (Abs) 0.0 0.0 - 0.1 x10E3/uL  Comprehensive metabolic panel   Collection Time: 01/11/23  9:33 AM  Result Value Ref Range   Glucose 87 70 - 99 mg/dL   BUN 10 8 - 27 mg/dL   Creatinine, Ser 4.09 0.57 - 1.00 mg/dL   eGFR 88 >81 XB/JYN/8.29   BUN/Creatinine Ratio 14 12 - 28   Sodium 133 (L) 134 - 144 mmol/L   Potassium 4.9 3.5 - 5.2 mmol/L   Chloride 95 (L) 96 - 106 mmol/L   CO2 25 20 - 29 mmol/L   Calcium  10.0 8.7 - 10.3 mg/dL   Total Protein 7.0 6.0 - 8.5 g/dL   Albumin 4.6 3.9 - 4.9 g/dL   Globulin, Total 2.4 1.5 - 4.5 g/dL   Bilirubin Total 0.5 0.0 - 1.2 mg/dL   Alkaline Phosphatase 81 44 - 121 IU/L   AST 26 0 - 40 IU/L   ALT 18 0 - 32 IU/L  Lipid panel   Collection Time: 01/11/23  9:33 AM  Result Value Ref Range   Cholesterol, Total 170 100 - 199 mg/dL   Triglycerides 53 0 - 149 mg/dL   HDL 85 >56 mg/dL   VLDL Cholesterol Cal 11 5 - 40 mg/dL   LDL Chol Calc (NIH) 74 0 - 99 mg/dL   Chol/HDL Ratio 2.0 0.0 - 4.4 ratio  TSH   Collection Time: 01/11/23  9:33 AM  Result Value Ref Range   TSH 2.230 0.450 - 4.500 uIU/mL  Cologuard   Collection Time: 04/15/23  8:30 AM  Result Value Ref Range   COLOGUARD No Result Obtained 3 N/A      Assessment & Plan:   Problem List Items Addressed This Visit       Cardiovascular and Mediastinum   Essential hypertension - Primary   Chronic. Not well controlled.  Will change Lisinopril  to Olmesartan 40mg  daily.  Side effects and benefits of medication discussed during visit.  Labs ordered.  Continue with HCTZ.  Follow up in 1 month.  Call sooner if concerns arise.       Relevant Medications   olmesartan (BENICAR) 40 MG tablet   Other Relevant Orders   Comp Met  (CMET)     Nervous and Auditory   Benign essential tremor   Hs not followed by with Neurology.  No change at this time.        Other   Hyperlipidemia   Chronic.  Controlled.  Continue with current medication regimen of rosuvastatin .  Labs ordered today.  Return to clinic in 6 months for reevaluation.  Call sooner if concerns arise.        Relevant Medications   olmesartan (BENICAR) 40 MG tablet   Other Relevant Orders   Lipid Profile   Major depression, recurrent, chronic (HCC)   Chronic. Not well controlled.  Has been on Wellbutrin  in the past with Venlafaxine .  Will restart Wellbutrin .  Continue with Venlaxafine.  Follow up in 1 month.  Call sooner if concerns.       Relevant Medications   buPROPion  (WELLBUTRIN  XL) 150 MG 24  hr tablet   Mixed incontinence   Chronic.  Controlled.  Continue with current medication regimen.  Labs ordered today.  Return to clinic in 6 months for reevaluation.  Call sooner if concerns arise.           Follow up plan: Return in about 1 month (around 09/11/2023) for BP Check, Depression/Anxiety FU.

## 2023-08-12 NOTE — Assessment & Plan Note (Signed)
 Chronic. Not well controlled.  Will change Lisinopril  to Olmesartan 40mg  daily.  Side effects and benefits of medication discussed during visit.  Labs ordered.  Continue with HCTZ.  Follow up in 1 month.  Call sooner if concerns arise.

## 2023-08-13 ENCOUNTER — Ambulatory Visit: Payer: Self-pay | Admitting: Nurse Practitioner

## 2023-08-14 LAB — LIPID PANEL
Chol/HDL Ratio: 1.9 ratio (ref 0.0–4.4)
Cholesterol, Total: 163 mg/dL (ref 100–199)
HDL: 84 mg/dL (ref >39)
LDL Chol Calc (NIH): 66 mg/dL (ref 0–99)
Triglycerides: 69 mg/dL (ref 0–149)
VLDL Cholesterol Cal: 13 mg/dL (ref 5–40)

## 2023-08-14 LAB — COMPREHENSIVE METABOLIC PANEL WITH GFR
ALT: 16 IU/L (ref 0–32)
AST: 24 IU/L (ref 0–40)
Albumin: 4.8 g/dL (ref 3.9–4.9)
Alkaline Phosphatase: 79 IU/L (ref 44–121)
BUN/Creatinine Ratio: 16 (ref 12–28)
BUN: 13 mg/dL (ref 8–27)
Bilirubin Total: 0.4 mg/dL (ref 0.0–1.2)
CO2: 23 mmol/L (ref 20–29)
Calcium: 9.9 mg/dL (ref 8.7–10.3)
Chloride: 94 mmol/L — ABNORMAL LOW (ref 96–106)
Creatinine, Ser: 0.8 mg/dL (ref 0.57–1.00)
Globulin, Total: 2.2 g/dL (ref 1.5–4.5)
Glucose: 70 mg/dL (ref 70–99)
Potassium: 4.6 mmol/L (ref 3.5–5.2)
Sodium: 138 mmol/L (ref 134–144)
Total Protein: 7 g/dL (ref 6.0–8.5)
eGFR: 79 mL/min/{1.73_m2} (ref >59)

## 2023-08-26 ENCOUNTER — Other Ambulatory Visit: Payer: Self-pay | Admitting: Nurse Practitioner

## 2023-08-28 NOTE — Telephone Encounter (Signed)
 Requested Prescriptions  Pending Prescriptions Disp Refills   venlafaxine  XR (EFFEXOR -XR) 150 MG 24 hr capsule [Pharmacy Med Name: VENLAFAXINE  ER 150MG  CAPSULES] 30 capsule 0    Sig: TAKE 1 CAPSULE(150 MG) BY MOUTH DAILY WITH BREAKFAST     Psychiatry: Antidepressants - SNRI - desvenlafaxine & venlafaxine  Failed - 08/28/2023  4:36 PM      Failed - Last BP in normal range    BP Readings from Last 1 Encounters:  08/12/23 (!) 166/84         Failed - Valid encounter within last 6 months    Recent Outpatient Visits           2 weeks ago Essential hypertension   [place] [facility] Aileen Alexanders, NP              Failed - Lipid Panel in normal range within the last 12 months    Cholesterol, Total  Date Value Ref Range Status  08/12/2023 163 100 - 199 mg/dL Final   LDL Chol Calc (NIH)  Date Value Ref Range Status  08/12/2023 66 0 - 99 mg/dL Final   HDL  Date Value Ref Range Status  08/12/2023 84 >39 mg/dL Final   Triglycerides  Date Value Ref Range Status  08/12/2023 69 0 - 149 mg/dL Final         Passed - Cr in normal range and within 360 days    Creatinine, Ser  Date Value Ref Range Status  08/12/2023 0.80 0.57 - 1.00 mg/dL Final         Passed - Completed PHQ-2 or PHQ-9 in the last 360 days       hydrochlorothiazide  (MICROZIDE ) 12.5 MG capsule [Pharmacy Med Name: HYDROCHLOROTHIAZIDE  12.5MG  CAPSULES] 30 capsule 0    Sig: TAKE 1 CAPSULE(12.5 MG) BY MOUTH DAILY     Cardiovascular: Diuretics - Thiazide Failed - 08/28/2023  4:36 PM      Failed - Last BP in normal range    BP Readings from Last 1 Encounters:  08/12/23 (!) 166/84         Failed - Valid encounter within last 6 months    Recent Outpatient Visits           2 weeks ago Essential hypertension   [facility] Aileen Alexanders, NP              Passed - Cr in normal range and within 180 days    Creatinine, Ser  Date Value Ref Range Status  08/12/2023  0.80 0.57 - 1.00 mg/dL Final         Passed - K in normal range and within 180 days    Potassium  Date Value Ref Range Status  08/12/2023 4.6 3.5 - 5.2 mmol/L Final         Passed - Na in normal range and within 180 days    Sodium  Date Value Ref Range Status  08/12/2023 138 134 - 144 mmol/L Final

## 2023-09-01 ENCOUNTER — Other Ambulatory Visit: Payer: Self-pay | Admitting: Nurse Practitioner

## 2023-09-03 NOTE — Telephone Encounter (Signed)
 Requested medications are due for refill today.  Unsure  Requested medications are on the active medications list.  yes  Last refill. 07/25/2023 #30 0 rf  Future visit scheduled.   yes  Notes to clinic.  Pt has both 75 and 150 mg prescribed.    Requested Prescriptions  Pending Prescriptions Disp Refills   venlafaxine  XR (EFFEXOR -XR) 75 MG 24 hr capsule [Pharmacy Med Name: VENLAFAXINE  ER 75MG  CAPSULES] 30 capsule 0    Sig: TAKE 1 CAPSULE(75 MG) BY MOUTH DAILY WITH BREAKFAST     Psychiatry: Antidepressants - SNRI - desvenlafaxine & venlafaxine  Failed - 09/03/2023  2:44 PM      Failed - Last BP in normal range    BP Readings from Last 1 Encounters:  08/12/23 (!) 166/84         Failed - Valid encounter within last 6 months    Recent Outpatient Visits           3 weeks ago Essential hypertension   [facility] Melvin Pao, NP              Failed - Lipid Panel in normal range within the last 12 months    Cholesterol, Total  Date Value Ref Range Status  08/12/2023 163 100 - 199 mg/dL Final   LDL Chol Calc (NIH)  Date Value Ref Range Status  08/12/2023 66 0 - 99 mg/dL Final   HDL  Date Value Ref Range Status  08/12/2023 84 >39 mg/dL Final   Triglycerides  Date Value Ref Range Status  08/12/2023 69 0 - 149 mg/dL Final         Passed - Cr in normal range and within 360 days    Creatinine, Ser  Date Value Ref Range Status  08/12/2023 0.80 0.57 - 1.00 mg/dL Final         Passed - Completed PHQ-2 or PHQ-9 in the last 360 days      Refused Prescriptions Disp Refills   lisinopril  (ZESTRIL ) 40 MG tablet [Pharmacy Med Name: LISINOPRIL  40MG  TABLETS] 30 tablet 0    Sig: TAKE 1 TABLET(40 MG) BY MOUTH DAILY     Cardiovascular:  ACE Inhibitors Failed - 09/03/2023  2:44 PM      Failed - Last BP in normal range    BP Readings from Last 1 Encounters:  08/12/23 (!) 166/84         Failed - Valid encounter within last 6 months    Recent  Outpatient Visits           3 weeks ago Essential hypertension   [place] [facility] Melvin Pao, NP              Passed - Cr in normal range and within 180 days    Creatinine, Ser  Date Value Ref Range Status  08/12/2023 0.80 0.57 - 1.00 mg/dL Final         Passed - K in normal range and within 180 days    Potassium  Date Value Ref Range Status  08/12/2023 4.6 3.5 - 5.2 mmol/L Final         Passed - Patient is not pregnant

## 2023-09-03 NOTE — Telephone Encounter (Signed)
 Requested Prescriptions  Pending Prescriptions Disp Refills   venlafaxine  XR (EFFEXOR -XR) 75 MG 24 hr capsule [Pharmacy Med Name: VENLAFAXINE  ER 75MG  CAPSULES] 30 capsule 0    Sig: TAKE 1 CAPSULE(75 MG) BY MOUTH DAILY WITH BREAKFAST     Psychiatry: Antidepressants - SNRI - desvenlafaxine & venlafaxine  Failed - 09/03/2023  2:44 PM      Failed - Last BP in normal range    BP Readings from Last 1 Encounters:  08/12/23 (!) 166/84         Failed - Valid encounter within last 6 months    Recent Outpatient Visits           3 weeks ago Essential hypertension   [facility] Melvin Pao, NP              Failed - Lipid Panel in normal range within the last 12 months    Cholesterol, Total  Date Value Ref Range Status  08/12/2023 163 100 - 199 mg/dL Final   LDL Chol Calc (NIH)  Date Value Ref Range Status  08/12/2023 66 0 - 99 mg/dL Final   HDL  Date Value Ref Range Status  08/12/2023 84 >39 mg/dL Final   Triglycerides  Date Value Ref Range Status  08/12/2023 69 0 - 149 mg/dL Final         Passed - Cr in normal range and within 360 days    Creatinine, Ser  Date Value Ref Range Status  08/12/2023 0.80 0.57 - 1.00 mg/dL Final         Passed - Completed PHQ-2 or PHQ-9 in the last 360 days       lisinopril  (ZESTRIL ) 40 MG tablet [Pharmacy Med Name: LISINOPRIL  40MG  TABLETS] 30 tablet 0    Sig: TAKE 1 TABLET(40 MG) BY MOUTH DAILY     Cardiovascular:  ACE Inhibitors Failed - 09/03/2023  2:44 PM      Failed - Last BP in normal range    BP Readings from Last 1 Encounters:  08/12/23 (!) 166/84         Failed - Valid encounter within last 6 months    Recent Outpatient Visits           3 weeks ago Essential hypertension   [facility] Melvin Pao, NP              Passed - Cr in normal range and within 180 days    Creatinine, Ser  Date Value Ref Range Status  08/12/2023 0.80 0.57 - 1.00 mg/dL Final          Passed - K in normal range and within 180 days    Potassium  Date Value Ref Range Status  08/12/2023 4.6 3.5 - 5.2 mmol/L Final         Passed - Patient is not pregnant

## 2023-09-05 ENCOUNTER — Other Ambulatory Visit: Payer: Self-pay | Admitting: Nurse Practitioner

## 2023-09-06 NOTE — Telephone Encounter (Signed)
 Requested Prescriptions  Pending Prescriptions Disp Refills   rosuvastatin  (CRESTOR ) 10 MG tablet [Pharmacy Med Name: ROSUVASTATIN  10MG  TABLETS] 90 tablet 0    Sig: TAKE 1 TABLET(10 MG) BY MOUTH DAILY     Cardiovascular:  Antilipid - Statins 2 Failed - 09/06/2023  2:51 PM      Failed - Valid encounter within last 12 months    Recent Outpatient Visits           3 weeks ago Essential hypertension   [place] [facility] Melvin Pao, NP              Failed - Lipid Panel in normal range within the last 12 months    Cholesterol, Total  Date Value Ref Range Status  08/12/2023 163 100 - 199 mg/dL Final   LDL Chol Calc (NIH)  Date Value Ref Range Status  08/12/2023 66 0 - 99 mg/dL Final   HDL  Date Value Ref Range Status  08/12/2023 84 >39 mg/dL Final   Triglycerides  Date Value Ref Range Status  08/12/2023 69 0 - 149 mg/dL Final         Passed - Cr in normal range and within 360 days    Creatinine, Ser  Date Value Ref Range Status  08/12/2023 0.80 0.57 - 1.00 mg/dL Final         Passed - Patient is not pregnant

## 2023-09-11 ENCOUNTER — Encounter: Payer: Self-pay | Admitting: Nurse Practitioner

## 2023-09-11 ENCOUNTER — Ambulatory Visit: Admitting: Nurse Practitioner

## 2023-09-11 VITALS — BP 115/75 | HR 75 | Ht 61.0 in | Wt 109.0 lb

## 2023-09-11 DIAGNOSIS — I10 Essential (primary) hypertension: Secondary | ICD-10-CM

## 2023-09-11 MED ORDER — OLMESARTAN MEDOXOMIL 40 MG PO TABS: 40.0000 mg | ORAL_TABLET | Freq: Every day | ORAL | 1 refills | Status: DC

## 2023-09-11 NOTE — Progress Notes (Addendum)
 BP 115/75 (BP Location: Right Arm, Patient Position: Sitting, Cuff Size: Normal)   Pulse 75   Ht 5' 1 (1.549 m)   Wt 109 lb (49.4 kg)   SpO2 95%   BMI 20.60 kg/m    Subjective:    Patient ID: Eileen Stewart, female    DOB: 22-Jul-1950, 73 y.o.   MRN: [id number]  HPI: Eileen Stewart is a 73 y.o. female  Chief Complaint  Patient presents with   Follow-up    BP Check, Depression/Anxiety FU. Sore muscle upper abdominal, burping worse when bending over.   HYPERTENSION / HYPERLIPIDEMIA She was changed from Lisinopril  to Olmesartan  at last visit.  Blood pressure is much better.   Satisfied with current treatment? yes Duration of hypertension: years BP monitoring frequency: not checking BP range:  BP medication side effects: no Past BP meds: lisinopril -HCTZ Duration of hyperlipidemia: years Cholesterol medication side effects: no Cholesterol supplements: none Past cholesterol medications: rosuvastatin  (crestor ) Medication compliance: excellent compliance Aspirin : no Recent stressors: no Recurrent headaches: no Visual changes: no Palpitations: no Dyspnea: no Chest pain: no Lower extremity edema: no Dizzy/lightheaded: no  The 10-year ASCVD risk score (Arnett DK, et al., 2019) is: 11.6%   Values used to calculate the score:     Age: 76 years     Sex: Female     Is Non-Hispanic African American: No     Diabetic: No     Tobacco smoker: No     Systolic Blood Pressure: 124 mmHg     Is BP treated: Yes     HDL Cholesterol: 66 mg/dL     Total Cholesterol: 208 mg/dL      Relevant past medical, surgical, family and social history reviewed and updated as indicated. Interim medical history since our last visit reviewed. Allergies and medications reviewed and updated.  Review of Systems  Eyes:  Negative for visual disturbance.  Respiratory:  Negative for cough, chest tightness and shortness of breath.   Cardiovascular:  Negative for chest pain, palpitations and leg  swelling.  Neurological:  Negative for dizziness and headaches.    Per HPI unless specifically indicated above     Objective:     BP 115/75 (BP Location: Right Arm, Patient Position: Sitting, Cuff Size: Normal)   Pulse 75   Ht 5' 1 (1.549 m)   Wt 109 lb (49.4 kg)   SpO2 95%   BMI 20.60 kg/m   Wt Readings from Last 3 Encounters:  09/11/23 109 lb (49.4 kg)  08/12/23 113 lb (51.3 kg)  01/11/23 114 lb (51.7 kg)    Physical Exam Vitals and nursing note reviewed.  Constitutional:      General: She is not in acute distress.    Appearance: Normal appearance. She is normal weight. She is not ill-appearing, toxic-appearing or diaphoretic.  HENT:     Head: Normocephalic.     Right Ear: External ear normal.     Left Ear: External ear normal.     Nose: Nose normal.     Mouth/Throat:     Mouth: Mucous membranes are moist.     Pharynx: Oropharynx is clear.  Eyes:     General:        Right eye: No discharge.        Left eye: No discharge.     Extraocular Movements: Extraocular movements intact.     Conjunctiva/sclera: Conjunctivae normal.     Pupils: Pupils are equal, round, and reactive to light.  Cardiovascular:  Rate and Rhythm: Normal rate and regular rhythm.     Heart sounds: No murmur heard. Pulmonary:     Effort: Pulmonary effort is normal. No respiratory distress.     Breath sounds: Normal breath sounds. No wheezing or rales.  Musculoskeletal:     Cervical back: Normal range of motion and neck supple.  Skin:    General: Skin is warm and dry.     Capillary Refill: Capillary refill takes less than 2 seconds.  Neurological:     General: No focal deficit present.     Mental Status: She is alert and oriented to person, place, and time. Mental status is at baseline.  Psychiatric:        Mood and Affect: Mood normal.        Behavior: Behavior normal.        Thought Content: Thought content normal.        Judgment: Judgment normal.     Results for orders placed or  performed in visit on 08/12/23  Comp Met (CMET)   Collection Time: 08/12/23  2:35 PM  Result Value Ref Range   Glucose 70 70 - 99 mg/dL   BUN 13 8 - 27 mg/dL   Creatinine, Ser 9.19 0.57 - 1.00 mg/dL   eGFR 79 >40 fO/fpw/8.26   BUN/Creatinine Ratio 16 12 - 28   Sodium 138 134 - 144 mmol/L   Potassium 4.6 3.5 - 5.2 mmol/L   Chloride 94 (L) 96 - 106 mmol/L   CO2 23 20 - 29 mmol/L   Calcium  9.9 8.7 - 10.3 mg/dL   Total Protein 7.0 6.0 - 8.5 g/dL   Albumin 4.8 3.9 - 4.9 g/dL   Globulin, Total 2.2 1.5 - 4.5 g/dL   Bilirubin Total 0.4 0.0 - 1.2 mg/dL   Alkaline Phosphatase 79 44 - 121 IU/L   AST 24 0 - 40 IU/L   ALT 16 0 - 32 IU/L  Lipid Profile   Collection Time: 08/12/23  2:35 PM  Result Value Ref Range   Cholesterol, Total 163 100 - 199 mg/dL   Triglycerides 69 0 - 149 mg/dL   HDL 84 >60 mg/dL   VLDL Cholesterol Cal 13 5 - 40 mg/dL   LDL Chol Calc (NIH) 66 0 - 99 mg/dL   Chol/HDL Ratio 1.9 0.0 - 4.4 ratio      Assessment & Plan:   Problem List Items Addressed This Visit       Cardiovascular and Mediastinum   Essential hypertension - Primary   Chronic.  Controlled.  Continue with current medication regimen of Olmesartan  40mg .  Refills sent today.   Return to clinic in 6 months for reevaluation.  Call sooner if concerns arise.        Relevant Medications   olmesartan  (BENICAR ) 40 MG tablet      Follow up plan: Return in about 5 months (around 02/11/2024) for HTN, HLD, DM2 FU.

## 2023-09-11 NOTE — Assessment & Plan Note (Signed)
 Chronic.  Controlled.  Continue with current medication regimen of Olmesartan  40mg .  Refills sent today.   Return to clinic in 6 months for reevaluation.  Call sooner if concerns arise.

## 2023-09-17 ENCOUNTER — Other Ambulatory Visit: Payer: Self-pay | Admitting: Nurse Practitioner

## 2023-09-17 NOTE — Addendum Note (Signed)
 Addended by: NELWYN LAYMON SAILOR on: 09/17/2023 04:06 PM   Modules accepted: Orders

## 2023-09-17 NOTE — Telephone Encounter (Signed)
 Copied from CRM [id number]. Topic: Clinical - Prescription Issue >> Sep 17, 2023  3:22 PM Eileen Stewart wrote: Reason for CRM: Pt would like a call back regarding why all her medications showing No Refills? Please call her at [phone].

## 2023-09-17 NOTE — Telephone Encounter (Signed)
 Routing to provider. T'd up prescriptions with additional refill. Patient has next follow up scheduled in December.

## 2023-09-18 MED ORDER — VENLAFAXINE HCL ER 75 MG PO CP24: 75.0000 mg | ORAL_CAPSULE | Freq: Every day | ORAL | 1 refills | Status: DC

## 2023-09-18 MED ORDER — HYDROCHLOROTHIAZIDE 12.5 MG PO CAPS: ORAL_CAPSULE | ORAL | 1 refills | Status: DC

## 2023-09-18 MED ORDER — ROSUVASTATIN CALCIUM 10 MG PO TABS: ORAL_TABLET | ORAL | 1 refills | Status: DC

## 2023-09-18 MED ORDER — OXYBUTYNIN CHLORIDE ER 10 MG PO TB24: ORAL_TABLET | ORAL | 1 refills | Status: DC

## 2023-09-18 MED ORDER — BUPROPION HCL ER (XL) 150 MG PO TB24: 150.0000 mg | ORAL_TABLET | Freq: Every day | ORAL | 1 refills | Status: DC

## 2023-09-18 MED ORDER — VENLAFAXINE HCL ER 150 MG PO CP24: ORAL_CAPSULE | ORAL | 1 refills | Status: DC

## 2023-09-19 ENCOUNTER — Other Ambulatory Visit: Payer: Self-pay | Admitting: Nurse Practitioner

## 2023-09-20 NOTE — Telephone Encounter (Signed)
 Duplicate request- for all- filled 09/18/23 Requested Prescriptions  Pending Prescriptions Disp Refills   hydrochlorothiazide  (MICROZIDE ) 12.5 MG capsule [Pharmacy Med Name: HYDROCHLOROTHIAZIDE  12.5MG  CAPSULES] 30 capsule     Sig: TAKE 1 CAPSULE(12.5 MG) BY MOUTH DAILY     Cardiovascular: Diuretics - Thiazide Passed - 09/20/2023  3:33 PM      Passed - Cr in normal range and within 180 days    Creatinine, Ser  Date Value Ref Range Status  08/12/2023 0.80 0.57 - 1.00 mg/dL Final         Passed - K in normal range and within 180 days    Potassium  Date Value Ref Range Status  08/12/2023 4.6 3.5 - 5.2 mmol/L Final         Passed - Na in normal range and within 180 days    Sodium  Date Value Ref Range Status  08/12/2023 138 134 - 144 mmol/L Final         Passed - Last BP in normal range    BP Readings from Last 1 Encounters:  09/11/23 115/75         Passed - Valid encounter within last 6 months    Recent Outpatient Visits           1 week ago Essential hypertension   [place] [facility] Melvin Pao, NP   1 month ago Essential hypertension   [place] [facility] Melvin Pao, NP               venlafaxine  XR (EFFEXOR -XR) 75 MG 24 hr capsule [Pharmacy Med Name: VENLAFAXINE  ER 75MG  CAPSULES] 30 capsule     Sig: TAKE 1 CAPSULE(75 MG) BY MOUTH DAILY WITH BREAKFAST     Psychiatry: Antidepressants - SNRI - desvenlafaxine & venlafaxine  Failed - 09/20/2023  3:33 PM      Failed - Lipid Panel in normal range within the last 12 months    Cholesterol, Total  Date Value Ref Range Status  08/12/2023 163 100 - 199 mg/dL Final   LDL Chol Calc (NIH)  Date Value Ref Range Status  08/12/2023 66 0 - 99 mg/dL Final   HDL  Date Value Ref Range Status  08/12/2023 84 >39 mg/dL Final   Triglycerides  Date Value Ref Range Status  08/12/2023 69 0 - 149 mg/dL Final         Passed - Cr in normal range and within 360 days    Creatinine, Ser   Date Value Ref Range Status  08/12/2023 0.80 0.57 - 1.00 mg/dL Final         Passed - Completed PHQ-2 or PHQ-9 in the last 360 days      Passed - Last BP in normal range    BP Readings from Last 1 Encounters:  09/11/23 115/75         Passed - Valid encounter within last 6 months    Recent Outpatient Visits           1 week ago Essential hypertension   [place] [facility] Melvin Pao, NP   1 month ago Essential hypertension    [facility] Melvin Pao, NP               venlafaxine  XR (EFFEXOR -XR) 150 MG 24 hr capsule [Pharmacy Med Name: VENLAFAXINE  ER 150MG  CAPSULES] 30 capsule     Sig: TAKE 1 CAPSULE(150 MG) BY MOUTH DAILY WITH BREAKFAST     Psychiatry: Antidepressants - SNRI -  desvenlafaxine & venlafaxine  Failed - 09/20/2023  3:33 PM      Failed - Lipid Panel in normal range within the last 12 months    Cholesterol, Total  Date Value Ref Range Status  08/12/2023 163 100 - 199 mg/dL Final   LDL Chol Calc (NIH)  Date Value Ref Range Status  08/12/2023 66 0 - 99 mg/dL Final   HDL  Date Value Ref Range Status  08/12/2023 84 >39 mg/dL Final   Triglycerides  Date Value Ref Range Status  08/12/2023 69 0 - 149 mg/dL Final         Passed - Cr in normal range and within 360 days    Creatinine, Ser  Date Value Ref Range Status  08/12/2023 0.80 0.57 - 1.00 mg/dL Final         Passed - Completed PHQ-2 or PHQ-9 in the last 360 days      Passed - Last BP in normal range    BP Readings from Last 1 Encounters:  09/11/23 115/75         Passed - Valid encounter within last 6 months    Recent Outpatient Visits           1 week ago Essential hypertension   [facility] Melvin Pao, NP   1 month ago Essential hypertension   [facility] Melvin Pao, NP

## 2023-10-14 ENCOUNTER — Telehealth: Payer: Self-pay

## 2023-10-14 NOTE — Patient Instructions (Incomplete)

## 2023-10-14 NOTE — Telephone Encounter (Signed)
 Copied from CRM [id number]. Topic: Appointments - Scheduling Inquiry for Clinic >> Oct 14, 2023  4:24 PM Sophia H wrote: Reason for CRM: Patient is needing a medcheck, has an appt for this Friday with Jolene Cannady for possible UTI and is wanting to know if they can be combined or if a separate office visit is needed with PCP. Please advise # [phone], leave a message if she does not answer

## 2023-10-15 NOTE — Telephone Encounter (Signed)
 I'm not the one doing her visit for UTI on Friday- that is up to the provider.  I believe she needs another visit since she is not seeing her PCP.

## 2023-10-15 NOTE — Telephone Encounter (Signed)
 Called patient lvm for patient to call office and schedule follow appt with her PCP.

## 2023-10-15 NOTE — Telephone Encounter (Signed)
 Patient is not seeing PCP on Friday so she would need a separate appointment for a follow up/med check with her PCP. She is seeing Jolene for an acute visit only on Friday. Please call to schedule follow up with PCP.

## 2023-10-16 NOTE — Telephone Encounter (Signed)
 Scheduled

## 2023-10-18 ENCOUNTER — Ambulatory Visit (INDEPENDENT_AMBULATORY_CARE_PROVIDER_SITE_OTHER): Admitting: Nurse Practitioner

## 2023-10-18 ENCOUNTER — Encounter: Payer: Self-pay | Admitting: Nurse Practitioner

## 2023-10-18 VITALS — BP 134/80 | HR 79 | Temp 98.7°F | Ht 61.0 in | Wt 112.0 lb

## 2023-10-18 DIAGNOSIS — R399 Unspecified symptoms and signs involving the genitourinary system: Secondary | ICD-10-CM | POA: Diagnosis not present

## 2023-10-18 LAB — URINALYSIS, ROUTINE W REFLEX MICROSCOPIC
Bilirubin, UA: NEGATIVE
Glucose, UA: NEGATIVE
Ketones, UA: NEGATIVE
Nitrite, UA: NEGATIVE
Protein,UA: NEGATIVE
RBC, UA: NEGATIVE
Specific Gravity, UA: 1.02 (ref 1.005–1.030)
Urobilinogen, Ur: 0.2 mg/dL (ref 0.2–1.0)
pH, UA: 7 (ref 5.0–7.5)

## 2023-10-18 LAB — MICROSCOPIC EXAMINATION: Bacteria, UA: NONE SEEN

## 2023-10-18 LAB — WET PREP FOR TRICH, YEAST, CLUE
Clue Cell Exam: NEGATIVE
Trichomonas Exam: NEGATIVE
Yeast Exam: NEGATIVE

## 2023-10-18 NOTE — Progress Notes (Signed)
 BP 134/80 (BP Location: Left Arm)   Pulse 79   Temp 98.7 F (37.1 C) (Oral)   Ht 5' 1 (1.549 m)   Wt 112 lb (50.8 kg)   SpO2 97%   BMI 21.16 kg/m    Subjective:    Patient ID: Eileen Stewart, female    DOB: [birth year], 73 y.o.   MRN: [id number]  HPI: Eileen Stewart is a 72 y.o. female  Chief Complaint  Patient presents with   Urinary Tract Infection   URINARY SYMPTOMS Started with symptoms about a month ago, thinks they are getting better. S Dysuria: no Urinary frequency: yes Urgency: yes Small volume voids: was small, but is now normal Symptom severity: yes Urinary incontinence: improving Foul odor: no Hematuria: no Abdominal pain: no Back pain: no Suprapubic pain/pressure: no Flank pain: no Fever:  no Vomiting: no Relief with cranberry juice: not done Relief with pyridium: not done Status: better Previous urinary tract infection: yes Recurrent urinary tract infection: no Sexual activity: No sexually active History of sexually transmitted disease: no Treatments attempted: increased water    Relevant past medical, surgical, family and social history reviewed and updated as indicated. Interim medical history since our last visit reviewed. Allergies and medications reviewed and updated.  Review of Systems  Constitutional:  Negative for activity change, appetite change, diaphoresis, fatigue and fever.  Respiratory:  Negative for cough, chest tightness, shortness of breath and wheezing.   Cardiovascular:  Negative for chest pain, palpitations and leg swelling.  Gastrointestinal: Negative.   Genitourinary:  Positive for frequency and urgency. Negative for decreased urine volume, dysuria, flank pain and hematuria.  Neurological: Negative.   Psychiatric/Behavioral: Negative.      Per HPI unless specifically indicated above     Objective:    BP 134/80 (BP Location: Left Arm)   Pulse 79   Temp 98.7 F (37.1 C) (Oral)   Ht 5' 1 (1.549 m)   Wt 112 lb  (50.8 kg)   SpO2 97%   BMI 21.16 kg/m   Wt Readings from Last 3 Encounters:  10/18/23 112 lb (50.8 kg)  09/11/23 109 lb (49.4 kg)  08/12/23 113 lb (51.3 kg)    Physical Exam Vitals and nursing note reviewed.  Constitutional:      General: She is awake. She is not in acute distress.    Appearance: She is well-developed and well-groomed. She is not ill-appearing or toxic-appearing.  HENT:     Head: Normocephalic.     Right Ear: Hearing and external ear normal.     Left Ear: Hearing and external ear normal.  Eyes:     General: Lids are normal.        Right eye: No discharge.        Left eye: No discharge.     Conjunctiva/sclera: Conjunctivae normal.     Pupils: Pupils are equal, round, and reactive to light.  Neck:     Thyroid : No thyromegaly.     Vascular: No carotid bruit.  Cardiovascular:     Rate and Rhythm: Normal rate and regular rhythm.     Heart sounds: Normal heart sounds. No murmur heard.    No gallop.  Pulmonary:     Effort: Pulmonary effort is normal. No accessory muscle usage or respiratory distress.     Breath sounds: Normal breath sounds.  Abdominal:     General: Bowel sounds are normal. There is no distension.     Palpations: Abdomen is soft.     Tenderness:  There is no abdominal tenderness. There is no right CVA tenderness or left CVA tenderness.     Hernia: No hernia is present.  Musculoskeletal:     Cervical back: Normal range of motion and neck supple.     Right lower leg: No edema.     Left lower leg: No edema.  Lymphadenopathy:     Cervical: No cervical adenopathy.  Skin:    General: Skin is warm and dry.  Neurological:     Mental Status: She is alert and oriented to person, place, and time.     Deep Tendon Reflexes: Reflexes are normal and symmetric.     Reflex Scores:      Brachioradialis reflexes are 2+ on the right side and 2+ on the left side.      Patellar reflexes are 2+ on the right side and 2+ on the left side. Psychiatric:         Attention and Perception: Attention normal.        Mood and Affect: Mood normal.        Speech: Speech normal.        Behavior: Behavior normal. Behavior is cooperative.        Thought Content: Thought content normal.     Results for orders placed or performed in visit on 08/12/23  Comp Met (CMET)   Collection Time: 08/12/23  2:35 PM  Result Value Ref Range   Glucose 70 70 - 99 mg/dL   BUN 13 8 - 27 mg/dL   Creatinine, Ser 9.19 0.57 - 1.00 mg/dL   eGFR 79 >40 fO/fpw/8.26   BUN/Creatinine Ratio 16 12 - 28   Sodium 138 134 - 144 mmol/L   Potassium 4.6 3.5 - 5.2 mmol/L   Chloride 94 (L) 96 - 106 mmol/L   CO2 23 20 - 29 mmol/L   Calcium  9.9 8.7 - 10.3 mg/dL   Total Protein 7.0 6.0 - 8.5 g/dL   Albumin 4.8 3.9 - 4.9 g/dL   Globulin, Total 2.2 1.5 - 4.5 g/dL   Bilirubin Total 0.4 0.0 - 1.2 mg/dL   Alkaline Phosphatase 79 44 - 121 IU/L   AST 24 0 - 40 IU/L   ALT 16 0 - 32 IU/L  Lipid Profile   Collection Time: 08/12/23  2:35 PM  Result Value Ref Range   Cholesterol, Total 163 100 - 199 mg/dL   Triglycerides 69 0 - 149 mg/dL   HDL 84 >60 mg/dL   VLDL Cholesterol Cal 13 5 - 40 mg/dL   LDL Chol Calc (NIH) 66 0 - 99 mg/dL   Chol/HDL Ratio 1.9 0.0 - 4.4 ratio      Assessment & Plan:   Problem List Items Addressed This Visit   None Visit Diagnoses       UTI symptoms    -  Primary   Overall UA and Wet Prep reassuring, no treatment at this time. If symptoms worsen then return to office.   Relevant Orders   Urinalysis, Routine w reflex microscopic   WET PREP FOR TRICH, YEAST, CLUE        Follow up plan: Return for Return on September 3rd with Darice.

## 2023-11-13 ENCOUNTER — Encounter: Payer: Self-pay | Admitting: Nurse Practitioner

## 2023-11-13 ENCOUNTER — Ambulatory Visit (INDEPENDENT_AMBULATORY_CARE_PROVIDER_SITE_OTHER): Admitting: Nurse Practitioner

## 2023-11-13 VITALS — BP 155/73 | HR 73 | Temp 97.9°F | Ht 61.0 in | Wt 111.8 lb

## 2023-11-13 DIAGNOSIS — I10 Essential (primary) hypertension: Secondary | ICD-10-CM | POA: Diagnosis not present

## 2023-11-13 DIAGNOSIS — F339 Major depressive disorder, recurrent, unspecified: Secondary | ICD-10-CM

## 2023-11-13 MED ORDER — AMLODIPINE BESYLATE 5 MG PO TABS: 5.0000 mg | ORAL_TABLET | Freq: Every day | ORAL | 0 refills | Status: DC

## 2023-11-13 MED ORDER — BUPROPION HCL ER (SR) 100 MG PO TB12: 100.0000 mg | ORAL_TABLET | Freq: Two times a day (BID) | ORAL | 0 refills | Status: AC

## 2023-11-13 NOTE — Assessment & Plan Note (Signed)
 Chronic. Not well controlled. Will decrease dose of Wellbutrin  to 100mg  SR and continue with Venlafaxine  225mg .  Follow up in 1 month.  Call sooner if concerns arise.

## 2023-11-13 NOTE — Assessment & Plan Note (Signed)
 Chronic. Not well controlled.  Will add Amlodipine  5mg  daily.  Side effects and benefits of medication discussed during visit.  Continue with Olmesartan .  Follow up in 1 month.  Call sooner if concerns arise.

## 2023-11-13 NOTE — Progress Notes (Signed)
 BP (!) 155/73   Pulse 73   Temp 97.9 F (36.6 C) (Oral)   Ht 5' 1 (1.549 m)   Wt 111 lb 12.8 oz (50.7 kg)   SpO2 96%   BMI 21.12 kg/m    Subjective:    Patient ID: Eileen Stewart, female    DOB: 24-Mar-1950, 73 y.o.   MRN: [id number]  HPI: Eileen Stewart is a 73 y.o. female  Chief Complaint  Patient presents with   Depression   Hypertension   DEPRESSION Patient states she feels like she is okay.  She started the Wellbutrin  after our last visit.  States she feels like it is a bit much.  States she has some feelings of impatience.   Flowsheet Row Office Visit from 11/13/2023 in [facility]  PHQ-9 Total Score 2      11/13/2023    1:15 PM 09/11/2023    2:10 PM 08/12/2023    2:18 PM 01/11/2023    9:14 AM  GAD 7 : Generalized Anxiety Score  Nervous, Anxious, on Edge 1 1 1  0  Control/stop worrying 0 0 0 0  Worry too much - different things 0 0 2 0  Trouble relaxing 0 0 0 0  Restless 0 0 1 0  Easily annoyed or irritable 1 1 1  0  Afraid - awful might happen 0 0 0 0  Total GAD 7 Score 2 2 5  0  Anxiety Difficulty Not difficult at all Not difficult at all Somewhat difficult Not difficult at all    HYPERTENSION without Chronic Kidney Disease Hypertension status: uncontrolled  Satisfied with current treatment? no Duration of hypertension: years BP monitoring frequency:  not checking BP range:  BP medication side effects:  no Medication compliance: excellent compliance Previous BP meds:olmesartan  (benicar ) Aspirin : no Recurrent headaches: no Visual changes: no Palpitations: no Dyspnea: no Chest pain: no Lower extremity edema: no Dizzy/lightheaded: no   Relevant past medical, surgical, family and social history reviewed and updated as indicated. Interim medical history since our last visit reviewed. Allergies and medications reviewed and updated.  Review of Systems  Eyes:  Negative for visual disturbance.  Respiratory:  Negative for cough,  chest tightness and shortness of breath.   Cardiovascular:  Negative for chest pain, palpitations and leg swelling.  Neurological:  Negative for dizziness and headaches.  Psychiatric/Behavioral:  Positive for dysphoric mood. Negative for suicidal ideas. The patient is nervous/anxious.     Per HPI unless specifically indicated above     Objective:    BP (!) 155/73   Pulse 73   Temp 97.9 F (36.6 C) (Oral)   Ht 5' 1 (1.549 m)   Wt 111 lb 12.8 oz (50.7 kg)   SpO2 96%   BMI 21.12 kg/m   Wt Readings from Last 3 Encounters:  11/13/23 111 lb 12.8 oz (50.7 kg)  10/18/23 112 lb (50.8 kg)  09/11/23 109 lb (49.4 kg)    Physical Exam Vitals and nursing note reviewed.  Constitutional:      General: She is not in acute distress.    Appearance: Normal appearance. She is normal weight. She is not ill-appearing, toxic-appearing or diaphoretic.  HENT:     Head: Normocephalic.     Right Ear: External ear normal.     Left Ear: External ear normal.     Nose: Nose normal.     Mouth/Throat:     Mouth: Mucous membranes are moist.     Pharynx: Oropharynx is clear.  Eyes:     General:        Right eye: No discharge.        Left eye: No discharge.     Extraocular Movements: Extraocular movements intact.     Conjunctiva/sclera: Conjunctivae normal.     Pupils: Pupils are equal, round, and reactive to light.  Cardiovascular:     Rate and Rhythm: Normal rate and regular rhythm.     Heart sounds: No murmur heard. Pulmonary:     Effort: Pulmonary effort is normal. No respiratory distress.     Breath sounds: Normal breath sounds. No wheezing or rales.  Musculoskeletal:     Cervical back: Normal range of motion and neck supple.  Skin:    General: Skin is warm and dry.     Capillary Refill: Capillary refill takes less than 2 seconds.  Neurological:     General: No focal deficit present.     Mental Status: She is alert and oriented to person, place, and time. Mental status is at baseline.   Psychiatric:        Mood and Affect: Mood normal.        Behavior: Behavior normal.        Thought Content: Thought content normal.        Judgment: Judgment normal.     Results for orders placed or performed in visit on 10/18/23  WET PREP FOR TRICH, YEAST, CLUE   Collection Time: 10/18/23  3:08 PM   Specimen: Urine   Urine  Result Value Ref Range   Trichomonas Exam Negative Negative   Yeast Exam Negative Negative   Clue Cell Exam Negative Negative  Microscopic Examination   Collection Time: 10/18/23  3:08 PM   Urine  Result Value Ref Range   WBC, UA 0-5 0 - 5 /hpf   RBC, Urine 0-2 0 - 2 /hpf   Epithelial Cells (non renal) 0-10 0 - 10 /hpf   Bacteria, UA None seen None seen/Few  Urinalysis, Routine w reflex microscopic   Collection Time: 10/18/23  3:08 PM  Result Value Ref Range   Specific Gravity, UA 1.020 1.005 - 1.030   pH, UA 7.0 5.0 - 7.5   Color, UA Yellow Yellow   Appearance Ur Clear Clear   Leukocytes,UA 1+ (A) Negative   Protein,UA Negative Negative/Trace   Glucose, UA Negative Negative   Ketones, UA Negative Negative   RBC, UA Negative Negative   Bilirubin, UA Negative Negative   Urobilinogen, Ur 0.2 0.2 - 1.0 mg/dL   Nitrite, UA Negative Negative   Microscopic Examination See below:       Assessment & Plan:   Problem List Items Addressed This Visit       Cardiovascular and Mediastinum   Essential hypertension   Chronic. Not well controlled.  Will add Amlodipine  5mg  daily.  Side effects and benefits of medication discussed during visit.  Continue with Olmesartan .  Follow up in 1 month.  Call sooner if concerns arise.       Relevant Medications   amLODipine  (NORVASC ) 5 MG tablet     Other   Major depression, recurrent, chronic (HCC) - Primary   Chronic. Not well controlled. Will decrease dose of Wellbutrin  to 100mg  SR and continue with Venlafaxine  225mg .  Follow up in 1 month.  Call sooner if concerns arise.       Relevant Medications    buPROPion  ER (WELLBUTRIN  SR) 100 MG 12 hr tablet     Follow up plan: Return  in about 1 month (around 12/13/2023) for BP Check, Depression/Anxiety FU.

## 2023-11-14 ENCOUNTER — Telehealth: Payer: Self-pay

## 2023-11-14 NOTE — Telephone Encounter (Unsigned)
 Copied from CRM [id number]. Topic: Clinical - Medication Question >> Nov 13, 2023  1:44 PM Ivette P wrote: Reason for CRM: PT wants to speak to provider about changes in medication during visit.   PT has extreme dry mouth and whatever medication is causing this needs to stop taking medication.    Pls call pt regarding

## 2023-11-15 NOTE — Telephone Encounter (Signed)
 Called and spoke to patient. She states that she has been having a side effect of severe dry mouth. Patient states she thinks this is coming from her Olmesartan . Patient states she does not like this and wants to possibly switch medications.

## 2023-11-18 NOTE — Telephone Encounter (Signed)
 Called and LVM asking for patient to please return my call.   OK for E2C2 to speak with patient and ask patient providers question regarding medication.

## 2023-11-18 NOTE — Telephone Encounter (Signed)
 Is this new since we added the Amlodipine  at her last visit?  She has been on the Olmesartan  for a couple of month.

## 2023-11-19 MED ORDER — VALSARTAN 80 MG PO TABS: 80.0000 mg | ORAL_TABLET | Freq: Every day | ORAL | 0 refills | Status: DC

## 2023-11-19 NOTE — Telephone Encounter (Signed)
 Called and spoke with patient. Notified patient of medication change.

## 2023-11-19 NOTE — Telephone Encounter (Signed)
 Copied from CRM [id number]. Topic: Clinical - Medication Question >> Nov 18, 2023  4:59 PM Lauren C wrote: Reason for CRM: Pt returning call to [place] CMA. See 9/4 telephone encounter. Pt says this is not new since adding the amlodipine , she says this started about a month prior to that. Please reach out to pt again if you have any further questions.

## 2023-11-19 NOTE — Telephone Encounter (Signed)
 Medication changed from Olmesartan  to Valsartan .

## 2023-11-19 NOTE — Telephone Encounter (Signed)
Called and notified patient of medication change.  

## 2023-12-13 ENCOUNTER — Ambulatory Visit: Admitting: Nurse Practitioner

## 2023-12-30 DIAGNOSIS — H524 Presbyopia: Secondary | ICD-10-CM | POA: Diagnosis not present

## 2023-12-30 DIAGNOSIS — H259 Unspecified age-related cataract: Secondary | ICD-10-CM | POA: Diagnosis not present

## 2023-12-30 DIAGNOSIS — Z135 Encounter for screening for eye and ear disorders: Secondary | ICD-10-CM | POA: Diagnosis not present

## 2023-12-30 DIAGNOSIS — H52223 Regular astigmatism, bilateral: Secondary | ICD-10-CM | POA: Diagnosis not present

## 2023-12-30 DIAGNOSIS — H5203 Hypermetropia, bilateral: Secondary | ICD-10-CM | POA: Diagnosis not present

## 2024-01-02 ENCOUNTER — Telehealth: Payer: Self-pay | Admitting: Nurse Practitioner

## 2024-01-02 NOTE — Telephone Encounter (Signed)
 Copied from CRM #[id number]. Topic: Referral - Request for Referral >> Jan 02, 2024  1:40 PM Fonda T wrote: Did the patient discuss referral with their provider in the last year? Yes   Appointment offered? Yes  Type of order/referral and detailed reason for visit: Bladder issues, bladder medications are not helping/working  Preference of office, provider, location: No, open to recommendations, per patient the last place she was referred to in [place] location  If referral order, have you been seen by this specialty before? Yes, [facility] Physical Therapy-Pelvic floor physical therapy-sent to previously [facility]  (If Yes, this issue or another issue? When? Where?  Can we respond through MyChart? No, patient prefers a return phone call at [phone]

## 2024-01-07 NOTE — Telephone Encounter (Signed)
 Called and spoke with patient. She states that she feels like her bladder medication is not working any longer. Doesn't know if Darice can discuss this with her or if she needs to see Urology regarding this. Reviewed patient's chart and she has not seen Urology since 2021. Advised that patient would need an appointment to discuss this with Darice. Appointment scheduled for the patient.

## 2024-01-08 ENCOUNTER — Ambulatory Visit (INDEPENDENT_AMBULATORY_CARE_PROVIDER_SITE_OTHER): Admitting: Nurse Practitioner

## 2024-01-08 ENCOUNTER — Encounter: Payer: Self-pay | Admitting: Nurse Practitioner

## 2024-01-08 VITALS — BP 169/74 | HR 65 | Temp 98.0°F | Ht 61.0 in | Wt 110.4 lb

## 2024-01-08 DIAGNOSIS — N362 Urethral caruncle: Secondary | ICD-10-CM | POA: Diagnosis not present

## 2024-01-08 DIAGNOSIS — I10 Essential (primary) hypertension: Secondary | ICD-10-CM

## 2024-01-08 DIAGNOSIS — M542 Cervicalgia: Secondary | ICD-10-CM

## 2024-01-08 DIAGNOSIS — F339 Major depressive disorder, recurrent, unspecified: Secondary | ICD-10-CM | POA: Diagnosis not present

## 2024-01-08 MED ORDER — AMLODIPINE BESYLATE 10 MG PO TABS: 10.0000 mg | ORAL_TABLET | Freq: Every day | ORAL | Status: DC

## 2024-01-08 MED ORDER — VENLAFAXINE HCL ER 75 MG PO CP24: 75.0000 mg | ORAL_CAPSULE | Freq: Every day | ORAL | 1 refills | Status: AC

## 2024-01-08 MED ORDER — VALSARTAN 80 MG PO TABS: 80.0000 mg | ORAL_TABLET | Freq: Every day | ORAL | 0 refills | Status: DC

## 2024-01-08 MED ORDER — PREMARIN 0.625 MG/GM VA CREA: TOPICAL_CREAM | VAGINAL | 0 refills | Status: DC

## 2024-01-08 MED ORDER — OXYBUTYNIN CHLORIDE ER 10 MG PO TB24: ORAL_TABLET | ORAL | 1 refills | Status: DC

## 2024-01-08 MED ORDER — ROSUVASTATIN CALCIUM 10 MG PO TABS: ORAL_TABLET | ORAL | 1 refills | Status: AC

## 2024-01-08 MED ORDER — VENLAFAXINE HCL ER 150 MG PO CP24: ORAL_CAPSULE | ORAL | 1 refills | Status: AC

## 2024-01-08 MED ORDER — HYDROCHLOROTHIAZIDE 12.5 MG PO CAPS: ORAL_CAPSULE | ORAL | 1 refills | Status: DC

## 2024-01-08 NOTE — Assessment & Plan Note (Addendum)
 Chronic. Not well controlled.  Will increase Amlodipine  to 10mg .  Continue with Valsartan  80mg .  Complaining of dry mouth.  Will stop HCTZ.   Follow up in 1 month.  Call sooner if concerns arise.

## 2024-01-08 NOTE — Assessment & Plan Note (Signed)
Chronic.  Controlled.  Continue with current medication regimen of Effexor daily.  Labs ordered today.  Return to clinic in 6 months for reevaluation.  Call sooner if concerns arise.   

## 2024-01-08 NOTE — Progress Notes (Signed)
 BP (!) 169/74   Pulse 65   Temp 98 F (36.7 C) (Oral)   Ht 5' 1 (1.549 m)   Wt 110 lb 6.4 oz (50.1 kg)   SpO2 93%   BMI 20.86 kg/m    Subjective:    Patient ID: Eileen Stewart, female    DOB: 09-08-1950, 73 y.o.   MRN: [id number]  HPI: Eileen Stewart is a 73 y.o. female  Chief Complaint  Patient presents with   Anxiety   Depression   Hypertension   DEPRESSION Patient states she feels like she is okay.  She did not start the wellbutrin  because she doesn't feel like she needs it.    Flowsheet Row Office Visit from 01/08/2024 in [facility]  PHQ-9 Total Score 4      01/08/2024    1:54 PM 11/13/2023    1:15 PM 09/11/2023    2:10 PM 08/12/2023    2:18 PM  GAD 7 : Generalized Anxiety Score  Nervous, Anxious, on Edge 0 1 1 1   Control/stop worrying 0 0 0 0  Worry too much - different things 0 0 0 2  Trouble relaxing 0 0 0 0  Restless 0 0 0 1  Easily annoyed or irritable 0 1 1 1   Afraid - awful might happen 0 0 0 0  Total GAD 7 Score 0 2 2 5   Anxiety Difficulty  Not difficult at all Not difficult at all Somewhat difficult    HYPERTENSION without Chronic Kidney Disease Did start Amlodipine .  Also taking Valsartan  and HCTZ.  She is having dry mouth.  Hypertension status: uncontrolled  Satisfied with current treatment? no Duration of hypertension: years BP monitoring frequency:  not checking BP range:  BP medication side effects:  no Medication compliance: excellent compliance Previous BP meds:olmesartan  (benicar ) Aspirin : no Recurrent headaches: no Visual changes: no Palpitations: no Dyspnea: no Chest pain: no Lower extremity edema: no Dizzy/lightheaded: no  Patient states she has had a pain in her neck all summer long.  She would like to see Orthopedics for the neck pain.    Patient states she is having a problem with the Urethral Caruncle.  She has previously seen Urology and treated with vaginal estrogen.     Relevant past medical,  surgical, family and social history reviewed and updated as indicated. Interim medical history since our last visit reviewed. Allergies and medications reviewed and updated.  Review of Systems  HENT:         Dry mouth  Eyes:  Negative for visual disturbance.  Respiratory:  Negative for cough, chest tightness and shortness of breath.   Cardiovascular:  Negative for chest pain, palpitations and leg swelling.  Genitourinary:  Positive for urgency.  Neurological:  Negative for dizziness and headaches.  Psychiatric/Behavioral:  Positive for dysphoric mood. Negative for suicidal ideas. The patient is nervous/anxious.     Per HPI unless specifically indicated above     Objective:    BP (!) 169/74   Pulse 65   Temp 98 F (36.7 C) (Oral)   Ht 5' 1 (1.549 m)   Wt 110 lb 6.4 oz (50.1 kg)   SpO2 93%   BMI 20.86 kg/m   Wt Readings from Last 3 Encounters:  01/08/24 110 lb 6.4 oz (50.1 kg)  11/13/23 111 lb 12.8 oz (50.7 kg)  10/18/23 112 lb (50.8 kg)    Physical Exam Vitals and nursing note reviewed.  Constitutional:      General: She is  not in acute distress.    Appearance: Normal appearance. She is normal weight. She is not ill-appearing, toxic-appearing or diaphoretic.  HENT:     Head: Normocephalic.     Right Ear: External ear normal.     Left Ear: External ear normal.     Nose: Nose normal.     Mouth/Throat:     Mouth: Mucous membranes are moist.     Pharynx: Oropharynx is clear.  Eyes:     General:        Right eye: No discharge.        Left eye: No discharge.     Extraocular Movements: Extraocular movements intact.     Conjunctiva/sclera: Conjunctivae normal.     Pupils: Pupils are equal, round, and reactive to light.  Cardiovascular:     Rate and Rhythm: Normal rate and regular rhythm.     Heart sounds: No murmur heard. Pulmonary:     Effort: Pulmonary effort is normal. No respiratory distress.     Breath sounds: Normal breath sounds. No wheezing or rales.   Musculoskeletal:     Cervical back: Normal range of motion and neck supple.  Skin:    General: Skin is warm and dry.     Capillary Refill: Capillary refill takes less than 2 seconds.  Neurological:     General: No focal deficit present.     Mental Status: She is alert and oriented to person, place, and time. Mental status is at baseline.  Psychiatric:        Mood and Affect: Mood normal.        Behavior: Behavior normal.        Thought Content: Thought content normal.        Judgment: Judgment normal.     Results for orders placed or performed in visit on 10/18/23  WET PREP FOR TRICH, YEAST, CLUE   Collection Time: 10/18/23  3:08 PM   Specimen: Urine   Urine  Result Value Ref Range   Trichomonas Exam Negative Negative   Yeast Exam Negative Negative   Clue Cell Exam Negative Negative  Microscopic Examination   Collection Time: 10/18/23  3:08 PM   Urine  Result Value Ref Range   WBC, UA 0-5 0 - 5 /hpf   RBC, Urine 0-2 0 - 2 /hpf   Epithelial Cells (non renal) 0-10 0 - 10 /hpf   Bacteria, UA None seen None seen/Few  Urinalysis, Routine w reflex microscopic   Collection Time: 10/18/23  3:08 PM  Result Value Ref Range   Specific Gravity, UA 1.020 1.005 - 1.030   pH, UA 7.0 5.0 - 7.5   Color, UA Yellow Yellow   Appearance Ur Clear Clear   Leukocytes,UA 1+ (A) Negative   Protein,UA Negative Negative/Trace   Glucose, UA Negative Negative   Ketones, UA Negative Negative   RBC, UA Negative Negative   Bilirubin, UA Negative Negative   Urobilinogen, Ur 0.2 0.2 - 1.0 mg/dL   Nitrite, UA Negative Negative   Microscopic Examination See below:       Assessment & Plan:   Problem List Items Addressed This Visit       Cardiovascular and Mediastinum   Essential hypertension - Primary   Chronic. Not well controlled.  Will increase Amlodipine  to 10mg .  Continue with Valsartan  80mg .  Complaining of dry mouth.  Will stop HCTZ.   Follow up in 1 month.  Call sooner if concerns  arise.       Relevant  Medications   amLODipine  (NORVASC ) 10 MG tablet   rosuvastatin  (CRESTOR ) 10 MG tablet   valsartan  (DIOVAN ) 80 MG tablet     Genitourinary   Urethral caruncle   Reoccurred.  Initially treated by Urology in 2020/2021.  Will treat with premarin  cream 3x weekly x 1 month.  Follow up in 1 month.         Other   Major depression, recurrent, chronic   Chronic.  Controlled.  Continue with current medication regimen of Effexor  daily.   Labs ordered today.  Return to clinic in 6 months for reevaluation.  Call sooner if concerns arise.        Relevant Medications   venlafaxine  XR (EFFEXOR -XR) 150 MG 24 hr capsule   venlafaxine  XR (EFFEXOR -XR) 75 MG 24 hr capsule   Other Visit Diagnoses       Neck pain       Referral placed for Orthpedics.   Relevant Orders   Ambulatory referral to Orthopedic Surgery         Follow up plan: Return in about 6 weeks (around 02/19/2024) for Keep appt for December 3.

## 2024-01-08 NOTE — Assessment & Plan Note (Signed)
 Reoccurred.  Initially treated by Urology in 2020/2021.  Will treat with premarin  cream 3x weekly x 1 month.  Follow up in 1 month.

## 2024-01-13 ENCOUNTER — Ambulatory Visit

## 2024-01-13 ENCOUNTER — Encounter: Payer: Self-pay | Admitting: Radiology

## 2024-01-13 DIAGNOSIS — M79642 Pain in left hand: Secondary | ICD-10-CM | POA: Diagnosis not present

## 2024-01-13 DIAGNOSIS — S161XXA Strain of muscle, fascia and tendon at neck level, initial encounter: Secondary | ICD-10-CM | POA: Diagnosis not present

## 2024-01-13 DIAGNOSIS — M542 Cervicalgia: Secondary | ICD-10-CM

## 2024-01-13 DIAGNOSIS — M4310 Spondylolisthesis, site unspecified: Secondary | ICD-10-CM | POA: Diagnosis not present

## 2024-01-13 DIAGNOSIS — M79645 Pain in left finger(s): Secondary | ICD-10-CM

## 2024-01-13 DIAGNOSIS — M47812 Spondylosis without myelopathy or radiculopathy, cervical region: Secondary | ICD-10-CM | POA: Diagnosis not present

## 2024-01-13 DIAGNOSIS — M5030 Other cervical disc degeneration, unspecified cervical region: Secondary | ICD-10-CM

## 2024-01-13 DIAGNOSIS — M1812 Unilateral primary osteoarthritis of first carpometacarpal joint, left hand: Secondary | ICD-10-CM | POA: Diagnosis not present

## 2024-01-13 NOTE — Progress Notes (Signed)
 Orthopaedic Surgery New Patient Visit   History of Present Illness: The patient is a 73 y.o. female seen in clinic for 37-month history of posterior neck pain.  Describes as tightness/pulling sensation.  Mild in severity.  States began over the summer.  No known injury/trauma.  Exacerbated with turning neck.  More severe at night.  Denies any radiation into shoulder.  Denies associated upper extremity weakness or extremity numbness or tingling.  Has taken over-the-counter Aleve with no symptom improvement.  No previous history of spinal specialist evaluation or CESI.  Patient seen by Darice Petty NP, with [facility], on 01/08/2024. Reported her neck pain. Referred to orthopedics.  Patient also reports two month history of left thumb pain. Located at base of thumb. Describes as ache. Began when holding heavy object, has since lingered. Denies radiation of pain. Denies hand paresthesias/numbness. Denies hand weakness, dropping objects, difficulty using left hand. Patient states left hand pain has not impacted any ADLs/normal activities. No previous history of left hand issues. Patient is right hand dominant.  Patient with hypertension. Last kidney function testing 08/12/2023, five months ago. BUN 12, Cr 0.80, eGFR 79. Patient with no history of DM2. Patient with no smoking history.  Patient is retired.   Past Medical, Social and Family History: Past Medical History:  Diagnosis Date   Acute pyelonephritis    Alcohol abuse    Alcohol, remission since 2013   Anemia    Asymptomatic varicose veins, unspecified laterality    Depression    Essential tremor    Hyperlipidemia    Hypertension    Liver mass    Vitamin D  deficiency    Past Surgical History:  Procedure Laterality Date   SPLENECTOMY     No Known Allergies Current Outpatient Medications on File Prior to Visit  Medication Sig Dispense Refill   amLODipine  (NORVASC ) 10 MG tablet Take 1 tablet (10 mg  total) by mouth daily.     ASPIRIN  LOW DOSE 81 MG EC tablet TAKE 1 TABLET(81 MG) BY MOUTH DAILY 90 tablet 1   buPROPion  ER (WELLBUTRIN  SR) 100 MG 12 hr tablet Take 1 tablet (100 mg total) by mouth 2 (two) times daily. (Patient not taking: Reported on 01/08/2024) 90 tablet 0   conjugated estrogens  (PREMARIN ) vaginal cream M, W and Friday for 1 month 42.5 g 0   oxybutynin  (DITROPAN -XL) 10 MG 24 hr tablet TAKE 1 TABLET(10 MG) BY MOUTH IN THE MORNING AND AT BEDTIME 180 tablet 1   rosuvastatin  (CRESTOR ) 10 MG tablet TAKE 1 TABLET(10 MG) BY MOUTH DAILY 90 tablet 1   valsartan  (DIOVAN ) 80 MG tablet Take 1 tablet (80 mg total) by mouth daily. 90 tablet 0   venlafaxine  XR (EFFEXOR -XR) 150 MG 24 hr capsule TAKE 1 CAPSULE(150 MG) BY MOUTH DAILY WITH BREAKFAST 90 capsule 1   venlafaxine  XR (EFFEXOR -XR) 75 MG 24 hr capsule Take 1 capsule (75 mg total) by mouth daily with breakfast. 90 capsule 1   No current facility-administered medications on file prior to visit.   Social History   Tobacco Use   Smoking status: Never   Smokeless tobacco: Never  Vaping Use   Vaping status: Never Used  Substance Use Topics   Alcohol use: Not Currently   Drug use: No      I have reviewed past medical, surgical, social and family history, medications and allergies as documented in the EMR.  Review of Systems - A ROS was performed including pertinent positives and negatives as  documented in the HPI.     Physical Exam:  General/Constitutional: NAD Vascular: No edema, swelling or tenderness, except as noted in detailed exam Integumentary: No impressive skin lesions present, except as noted in detailed exam Neuro/Psych: Normal mood and affect, oriented to person, place and time Musculoskeletal: Normal, except as noted in detailed exam and in HPI   Focused Orthopaedic Examination:  Neck focused exam: Palpation: non-tender to palpation about the mid-line spine. No palpable step-off, deformity or crepitus.  Moderate tenderness with palpation over left lateral upper trapezius musculature with palpable muscle hypertonicity.  ROM: Full flexion and extension. Mild pulling sensation with flexion. Minimally limited lateral rotation, 45 degrees left and right, due to pulling/tightness sensation.  10 degrees right and left sidebending due to pulling/tightness sensation. Spurling's negative   Shoulder focused exam:    RIGHT LEFT  Scapula Atrophy Negative Negative   Winging Negative Negative  Rotator cuff Supraspinatus 5/5 5/5   Infraspinatus 5/5 5/5   Subscapularis 5/5 5/5  AROM/PROM (degrees) FF 0-170 / 0-170 0-170 / 0-170   Abduction 0-160 / 0-160 0-160 / 0-160   ER0 0-60 / 0-60 0-60 / 0-60  Palpation (pain): AC Not performed Negative   Biceps Not performed negative   Coracoid Not performed negative  Special Tests: O'Briens Not performed negative   Mayo-shear Not performed negative   Cross body Adduction Not performed negative  Other: Sulcus sign Not performed negative   Lateral deltoid 5/5 5/5    Vascular/Lymphatic: Fingers warm and well perfused with 2+ radial pulse.    Neurologic: Sensation intact to the Median, Ulnar and Radial nerve distribution of the hand. Sensation intact to lateral deltoid (axillary nerve).   Hand/Digit focused exam:  On gross examination of the left upper extremity the skin is intact.  Minimal edema over radial aspect of base of thumb and thumb metacarpal. Fingers warm and well perfused with 2+ radial pulse.  Sensation intact to the Median, Ulnar and Radial nerve distribution of the hand.    AIN/PIN/Ulnar nerve motor function intact.  Negative tinels at the cubital tunnel, negative tinels at the carpal tunnel.    Full flexion and extension of the fingers without any active triggering.  No pain about the A1 pulleys.    APB strength 5/5 with no signs of atrophy.    Mild pain about the basilar thumb joint.   Positive left CMC grind; palpable crepitus.  Able to  make a composite fist. Negative Finkelstein test.      XR Cervical Spine Imaging: X-rays of the cervical spine including 2-views (AP, lateral) obtained today 01/13/2024 at [facility] were reviewed personally by me.  Per my independent interpretation these images show moderate vertebral body height loss and disc space loss of C5, C6, and C7. Facet arthropathy noted at these levels. Mild retrolisthesis of C5 and C6. Loss of cervical lordosis. No acute fracture.   XR Left Hand Imaging: X-rays of the left hand including 3-views (PA, lateral, oblique) obtained today 01/13/2024 at [facility] at [facility] were reviewed personally by me.  Per my independent interpretation these images show moderate thumb CMC degenerative changes. No acute fracture. No dislocation. Mild soft tissue swelling over radial aspect of base of thumb.    Assessment:  Left trapezial strain Cervical spondylosis Left thumb CMC arthritis  Plan:  Patient was seen and examined in office today. We reviewed patient's history, examination, and imaging in detail. Based on information available for this encounter, patient  with 43-month history of neck/left trapezial pain.  Describes a pulling/tightening sensation.  No known precipitating injury/trauma.  Tenderness to palpation on physical exam over the trapezial musculature.  Pain also elicited with cervical range of motion testing.  Patient's cervical x-ray/imaging reveal significant cervical degenerative disc disease, primarily at C5-C7.  Patient with no cervical radiculopathy/myelopathy on exam.  Negative Spurling's.  Discussed findings with patient.  We believe she would benefit from focused modalities on her left trapezial region.  She believes her symptoms do not warrant further evaluation from spine specialist at this time. Placed formal referral to physical therapy.  Patient declined prescription anti-inflammatory or muscle  relaxer.  Patient will continue to take over-the-counter ibuprofen as needed.  Advised patient to take with food to prevent stomach upset.  Patient also with 73-month history of left thumb pain.  Located at thumb [facility] joint.  Positive CMC grind on physical exam.  Left hand imaging reveals mild to moderate arthritic changes at thumb CMC joint.  Discussed diagnosis with patient.  Discussed conservative management of ice/heat, over-the-counter anti-inflammatories, brace, and corticosteroid injection.  Patient fitted in a Comfort Cool brace, felt to provide moderate symptom relief.  Patient will use over-the-counter ibuprofen for symptom management.  Patient declined injection and states she will return if symptoms worsen.  Patient offered scheduled follow-up, however patient states she would prefer to call office if symptoms not improving or sooner if worsening.   All questions, concerns and comments were addressed to the best of my ability.  Follow-up: prn   Arlyss GEANNIE Schneider, DO Orthopedic Surgery & Sports Medicine [place]   This document was dictated using Dragon voice recognition software. A reasonable attempt at proof reading has been made to minimize errors.

## 2024-01-13 NOTE — Patient Instructions (Signed)

## 2024-01-17 ENCOUNTER — Other Ambulatory Visit: Payer: Self-pay | Admitting: Nurse Practitioner

## 2024-01-17 ENCOUNTER — Telehealth: Payer: Self-pay | Admitting: Nurse Practitioner

## 2024-01-17 NOTE — Telephone Encounter (Signed)
 Copied from CRM [id number]. Topic: Clinical - Medication Refill >> Jan 17, 2024 10:54 AM [name] wrote: Medication: amLODipine  (NORVASC ) 10 MG tablet  Pt has been using 5 mg and doubling up on that and now she is running short  Has the patient contacted their pharmacy? No   This is the patient's preferred pharmacy:  [id number] [address], [place] - [address] AT [facility] [address] [place] [address] [place] [address] Phone: [phone] Fax: [phone]  Is this the correct pharmacy for this prescription? Yes If no, delete pharmacy and type the correct one.   Has the prescription been filled recently? Yes  Is the patient out of the medication? Yes  Has the patient been seen for an appointment in the last year OR does the patient have an upcoming appointment? Yes  Can we respond through MyChart? Yes  Agent: Please be advised that Rx refills may take up to 3 business days. We ask that you follow-up with your pharmacy.

## 2024-01-17 NOTE — Telephone Encounter (Signed)
 Copied from CRM #[id number]. Topic: Clinical - Prescription Issue >> Jan 17, 2024 10:59 AM [name] wrote: Reason for CRM: venlafaxine  XR (EFFEXOR -XR) 75 MG 24 hr capsule pt wants to know if this should be xr or er? Please advise.  Pt seems very confused on everything please let Darice know she has an appt coming up. Really confused.

## 2024-01-17 NOTE — Telephone Encounter (Signed)
 Copied from CRM [id number]. Topic: Clinical - Prescription Issue >> Jan 17, 2024 11:01 AM Eileen Stewart wrote: Reason for CRM: conjugated estrogens  (PREMARIN ) vaginal cream   Pt said that her ins is charging 400.00 does this need a prior authorization.

## 2024-01-17 NOTE — Telephone Encounter (Signed)
 Copied from CRM [id number]. Topic: Referral - Question >> Jan 17, 2024 10:56 AM [name] wrote: Reason for CRM: pt wants to know if she needs to get a referral for pt for her neck from you or the ortho dr? Please advise.

## 2024-01-18 MED ORDER — AMLODIPINE BESYLATE 10 MG PO TABS: 10.0000 mg | ORAL_TABLET | Freq: Every day | ORAL | 0 refills | Status: AC

## 2024-01-18 NOTE — Telephone Encounter (Signed)
 Requested Prescriptions  Pending Prescriptions Disp Refills   amLODipine  (NORVASC ) 10 MG tablet 90 tablet 0    Sig: Take 1 tablet (10 mg total) by mouth daily.     Cardiovascular: Calcium  Channel Blockers 2 Failed - 01/18/2024 10:47 AM      Failed - Last BP in normal range    BP Readings from Last 1 Encounters:  01/08/24 (!) 169/74         Passed - Last Heart Rate in normal range    Pulse Readings from Last 1 Encounters:  01/08/24 65         Passed - Valid encounter within last 6 months    Recent Outpatient Visits           1 week ago Essential hypertension   [place] [facility] Melvin Pao, NP   2 months ago Major depression, recurrent, chronic   [place] [facility] Melvin Pao, NP   3 months ago UTI symptoms   [place] [facility] [place], Melanie T, NP   4 months ago Essential hypertension   [place] [facility] Melvin Pao, NP   5 months ago Essential hypertension   [place] [facility] Melvin Pao, NP

## 2024-01-20 ENCOUNTER — Telehealth: Payer: Self-pay | Admitting: Pharmacy Technician

## 2024-01-20 ENCOUNTER — Other Ambulatory Visit (HOSPITAL_COMMUNITY): Payer: Self-pay

## 2024-01-20 MED ORDER — ESTRADIOL 0.01 % VA CREA: TOPICAL_CREAM | VAGINAL | 1 refills | Status: DC

## 2024-01-20 NOTE — Telephone Encounter (Signed)
 Pharmacy Patient Advocate Encounter   Received notification from Pt Calls Messages that prior authorization for Premarin  Vaginal cream 0.625mg   is required/requested.   Insurance verification completed.   The patient is insured through [employer].   Per test claim: Claim processes, so the price the pharmacy is telling her is probably correct, based on the directions and day supply. However, the bill said to try Estradiol . It is no inexpensive, but significantly less. The test claim I did was for a 1 month supply and it was just over $65.    If suggested medication is appropriate, Please send in a new RX and discontinue this one. If not, please advise as to why it's not appropriate so that we may request a Prior Authorization. Please note, some preferred medications may still require a PA.  If the suggested medications have not been trialed and there are no contraindications to their use, the PA will not be submitted, as it will not be approved.

## 2024-01-20 NOTE — Telephone Encounter (Signed)
 Called and notified patient of providers message.

## 2024-01-20 NOTE — Telephone Encounter (Signed)
 XR and ER are the same thing.  They mean extended release.

## 2024-01-20 NOTE — Telephone Encounter (Signed)
 PA request has been Received. New Encounter has been or will be created for follow up. For additional info see Pharmacy Prior Auth telephone encounter from 01/20/24.

## 2024-01-20 NOTE — Telephone Encounter (Signed)
 Medication changed to Estradiol  cream which is preferred by insurance.

## 2024-01-20 NOTE — Telephone Encounter (Signed)
 PA team, can you guys check and see if this medication is needing a prior authorization please?

## 2024-01-21 ENCOUNTER — Ambulatory Visit: Admitting: Nurse Practitioner

## 2024-01-28 ENCOUNTER — Ambulatory Visit

## 2024-01-28 VITALS — Ht 61.0 in | Wt 112.0 lb

## 2024-01-28 DIAGNOSIS — Z1231 Encounter for screening mammogram for malignant neoplasm of breast: Secondary | ICD-10-CM

## 2024-01-28 DIAGNOSIS — Z0000 Encounter for general adult medical examination without abnormal findings: Secondary | ICD-10-CM | POA: Diagnosis not present

## 2024-01-28 NOTE — Progress Notes (Signed)
 Chief Complaint  Patient presents with   Medicare Wellness     Subjective:   Eileen Stewart is a 73 y.o. female who presents for a Medicare Annual Wellness Visit.  Allergies (verified) Patient has no known allergies.   History: Past Medical History:  Diagnosis Date   Acute pyelonephritis    Alcohol abuse    Alcohol, remission since 2013   Anemia    Asymptomatic varicose veins, unspecified laterality    Depression    Essential tremor    Hyperlipidemia    Hypertension    Liver mass    Vitamin D  deficiency    Past Surgical History:  Procedure Laterality Date   SPLENECTOMY     Family History  Problem Relation Age of Onset   Hyperlipidemia Mother    Hypertension Mother    Alcohol abuse Father    Depression Sister    Anxiety disorder Sister    Parkinson's disease Cousin    Social History   Occupational History   Occupation: retired  Tobacco Use   Smoking status: Never   Smokeless tobacco: Never  Vaping Use   Vaping status: Never Used  Substance and Sexual Activity   Alcohol use: Not Currently   Drug use: No   Sexual activity: Not Currently   Tobacco Counseling Counseling given: Not Answered  SDOH Screenings   Food Insecurity: No Food Insecurity (01/28/2024)  Housing: Low Risk  (01/28/2024)  Transportation Needs: No Transportation Needs (01/28/2024)  Utilities: Not At Risk (01/28/2024)  Alcohol Screen: Low Risk  (01/08/2022)  Depression (PHQ2-9): Low Risk  (01/28/2024)  Financial Resource Strain: Low Risk  (01/11/2023)  Physical Activity: Insufficiently Active (01/28/2024)  Social Connections: Moderately Integrated (01/28/2024)  Stress: Stress Concern Present (01/28/2024)  Tobacco Use: Low Risk  (01/28/2024)  Health Literacy: Adequate Health Literacy (01/28/2024)   See flowsheets for full screening details  Depression Screen PHQ 2 & 9 Depression Scale- Over the past 2 weeks, how often have you been bothered by any of the following  problems? Little interest or pleasure in doing things: 0 Feeling down, depressed, or hopeless (PHQ Adolescent also includes...irritable): 0 PHQ-2 Total Score: 0 Trouble falling or staying asleep, or sleeping too much: 0 Feeling tired or having little energy: 0 Poor appetite or overeating (PHQ Adolescent also includes...weight loss): 0 Feeling bad about yourself - or that you are a failure or have let yourself or your family down: 0 Trouble concentrating on things, such as reading the newspaper or watching television (PHQ Adolescent also includes...like school work): 0 Moving or speaking so slowly that other people could have noticed. Or the opposite - being so fidgety or restless that you have been moving around a lot more than usual: 0 Thoughts that you would be better off dead, or of hurting yourself in some way: 0 PHQ-9 Total Score: 0 If you checked off any problems, how difficult have these problems made it for you to do your work, take care of things at home, or get along with other people?: Not difficult at all  Depression Treatment Depression Interventions/Treatment : EYV7-0 Score <4 Follow-up Not Indicated; Currently on Treatment     Goals Addressed               This Visit's Progress     exercise more, eat better and come off of some medications (pt-stated)         Visit info / Clinical Intake: Medicare Wellness Visit Type:: Subsequent Annual Wellness Visit Persons participating in visit:: patient  Medicare Wellness Visit Mode:: Telephone If telephone:: video declined Because this visit was a virtual/telehealth visit:: pt reported vitals If Telephone or Video please confirm:: I connected with the patient using audio enabled telemedicine application and verified that I am speaking with the correct person using two identifiers; I discussed the limitations of evaluation and management by telemedicine; The patient expressed understanding and agreed to proceed Patient  Location:: home Provider Location:: office/clinic Information given by:: patient Interpreter Needed?: No Pre-visit prep was completed: yes AWV questionnaire completed by patient prior to visit?: no Living arrangements:: (!) lives alone Patient's Overall Health Status Rating: good Typical amount of pain: none Does pain affect daily life?: no Are you currently prescribed opioids?: no  Dietary Habits and Nutritional Risks How many meals a day?: 3 Eats fruit and vegetables daily?: (!) no Most meals are obtained by: preparing own meals In the last 2 weeks, have you had any of the following?: none Diabetic:: no  Functional Status Activities of Daily Living (to include ambulation/medication): Independent Ambulation: Independent with device- listed below Home Assistive Devices/Equipment: Eyeglasses Medication Administration: Independent Home Management: Independent Manage your own finances?: yes Primary transportation is: driving Concerns about vision?: no *vision screening is required for WTM* Concerns about hearing?: no  Fall Screening Falls in the past year?: 0 Number of falls in past year: 0 Was there an injury with Fall?: 0 Fall Risk Category Calculator: 0 Patient Fall Risk Level: Low Fall Risk  Fall Risk Patient at Risk for Falls Due to: No Fall Risks Fall risk Follow up: Falls evaluation completed  Home and Transportation Safety: All rugs have non-skid backing?: yes All stairs or steps have railings?: yes Grab bars in the bathtub or shower?: yes Have non-skid surface in bathtub or shower?: yes Good home lighting?: yes Regular seat belt use?: yes Hospital stays in the last year:: no  Cognitive Assessment Difficulty concentrating, remembering, or making decisions? : no Will 6CIT or Mini Cog be Completed: yes What year is it?: 0 points What month is it?: 0 points Give patient an address phrase to remember (5 components): [address] [place] About what time is  it?: 0 points Count backwards from 20 to 1: 0 points Say the months of the year in reverse: 0 points Repeat the address phrase from earlier: 0 points 6 CIT Score: 0 points  Advance Directives (For Healthcare) Does Patient Have a Medical Advance Directive?: Yes Does patient want to make changes to medical advance directive?: No - Patient declined Type of Advance Directive: Healthcare Power of [place]; Living will Copy of Healthcare Power of Attorney in Chart?: No - copy requested Copy of Living Will in Chart?: No - copy requested  Reviewed/Updated  Reviewed/Updated: Reviewed All (Medical, Surgical, Family, Medications, Allergies, Care Teams, Patient Goals)        Objective:    Today's Vitals   01/28/24 0958  Weight: 112 lb (50.8 kg)  Height: 5' 1 (1.549 m)   Body mass index is 21.16 kg/m.  Current Medications (verified) Outpatient Encounter Medications as of 01/28/2024  Medication Sig   amLODipine  (NORVASC ) 10 MG tablet Take 1 tablet (10 mg total) by mouth daily.   ASPIRIN  LOW DOSE 81 MG EC tablet TAKE 1 TABLET(81 MG) BY MOUTH DAILY   estradiol  (ESTRACE ) 0.01 % CREA vaginal cream Place 1 applicator full vaginally daily at bedtime x 2 weeks and then reduce to one applicator vaginally at bedtime twice a week only.   oxybutynin  (DITROPAN -XL) 10 MG 24 hr  tablet TAKE 1 TABLET(10 MG) BY MOUTH IN THE MORNING AND AT BEDTIME   rosuvastatin  (CRESTOR ) 10 MG tablet TAKE 1 TABLET(10 MG) BY MOUTH DAILY   valsartan  (DIOVAN ) 80 MG tablet Take 1 tablet (80 mg total) by mouth daily.   venlafaxine  XR (EFFEXOR -XR) 150 MG 24 hr capsule TAKE 1 CAPSULE(150 MG) BY MOUTH DAILY WITH BREAKFAST   venlafaxine  XR (EFFEXOR -XR) 75 MG 24 hr capsule Take 1 capsule (75 mg total) by mouth daily with breakfast.   buPROPion  ER (WELLBUTRIN  SR) 100 MG 12 hr tablet Take 1 tablet (100 mg total) by mouth 2 (two) times daily. (Patient not taking: Reported on 01/28/2024)   No facility-administered encounter  medications on file as of 01/28/2024.   Hearing/Vision screen Hearing Screening - Comments:: Denies hearing loss  Vision Screening - Comments:: UTD @ [facility] [place], Dr. Honora Senior Immunizations and Health Maintenance Health Maintenance  Topic Date Due   Fecal DNA (Cologuard)  03/12/2016   DEXA SCAN  Never done   Mammogram  01/03/2018   COVID-19 Vaccine (4 - 2025-26 season) 11/11/2023   Zoster Vaccines- Shingrix (1 of 2) 02/12/2024 (Originally 10/11/2002)   Influenza Vaccine  06/09/2024 (Originally 10/11/2023)   Pneumococcal Vaccine: 50+ Years (3 of 3 - PCV20 or PCV21) 11/12/2024 (Originally 08/24/2020)   Medicare Annual Wellness (AWV)  01/27/2025   DTaP/Tdap/Td (3 - Td or Tdap) 03/07/2029   Hepatitis C Screening  Completed   Meningococcal B Vaccine  Aged Out        Assessment/Plan:  This is a routine wellness examination for [place].  Patient Care Team: Melvin Pao, NP as PCP - Diedre Gust Molly, DO as Consulting Physician (Orthopedic Surgery) Dr. Senior [facility])  I have personally reviewed and noted the following in the patient's chart:   Medical and social history Use of alcohol, tobacco or illicit drugs  Current medications and supplements including opioid prescriptions. Functional ability and status Nutritional status Physical activity Advanced directives List of other physicians Hospitalizations, surgeries, and ER visits in previous 12 months Vitals Screenings to include cognitive, depression, and falls Referrals and appointments  Orders Placed This Encounter  Procedures   MM 3D SCREENING MAMMOGRAM BILATERAL BREAST    Standing Status:   Future    Expected Date:   01/29/2024    Expiration Date:   01/27/2025    Reason for Exam (SYMPTOM  OR DIAGNOSIS REQUIRED):   screening for breast cancer    Preferred imaging location?:   [facility]   In addition, I have reviewed and discussed with patient certain preventive protocols,  quality metrics, and best practice recommendations. A written personalized care plan for preventive services as well as general preventive health recommendations were provided to patient.   Vina Ned, CMA   01/28/2024   Return in 1 year (on 01/28/2025).  After Visit Summary: (MyChart) Due to this being a telephonic visit, the after visit summary with patients personalized plan was offered to patient via MyChart   Nurse Notes:  Placed order for a MMG Declined DEXA scan and cologuard Declined flu, pneumonia, shingles and covid vaccines

## 2024-01-28 NOTE — Patient Instructions (Signed)
 Ms. Carvey,  Thank you for taking the time for your Medicare Wellness Visit. I appreciate your continued commitment to your health goals. Please review the care plan we discussed, and feel free to reach out if I can assist you further.  Please note that Annual Wellness Visits do not include a physical exam. Some assessments may be limited, especially if the visit was conducted virtually. If needed, we may recommend an in-person follow-up with your provider.  Ongoing Care Seeing your primary care provider every 3 to 6 months helps us  monitor your health and provide consistent, personalized care.   Referrals If a referral was made during today's visit and you haven't received any updates within two weeks, please contact the referred provider directly to check on the status.  Recommended Screenings:  Please call to schedule your mammogram and/or bone density scan:  [facility] Address: [address], [place], [place] Phone: [phone]  [facility] [address], [address] [place], [place] [address] Phone: [phone]   Health Maintenance  Topic Date Due   Cologuard (Stool DNA test)  03/12/2016   DEXA scan (bone density measurement)  Never done   Breast Cancer Screening  01/03/2018   COVID-19 Vaccine (4 - 2025-26 season) 11/11/2023   Medicare Annual Wellness Visit  01/11/2024   Zoster (Shingles) Vaccine (1 of 2) 02/12/2024*   Flu Shot  06/09/2024*   Pneumococcal Vaccine for age over 67 (3 of 3 - PCV20 or PCV21) 11/12/2024*   DTaP/Tdap/Td vaccine (3 - Td or Tdap) 03/07/2029   Hepatitis C Screening  Completed   Meningitis B Vaccine  Aged Out  *Topic was postponed. The date shown is not the original due date.       01/28/2024   10:05 AM  Advanced Directives  Does Patient Have a Medical Advance Directive? Yes  Type of Estate Agent of [place];Living will  Does patient want to make  changes to medical advance directive? No - Patient declined  Copy of Healthcare Power of Attorney in Chart? No - copy requested    Vision: Annual vision screenings are recommended for early detection of glaucoma, cataracts, and diabetic retinopathy. These exams can also reveal signs of chronic conditions such as diabetes and high blood pressure.  Dental: Annual dental screenings help detect early signs of oral cancer, gum disease, and other conditions linked to overall health, including heart disease and diabetes.  Please see the attached documents for additional preventive care recommendations.   Fall Prevention in the Home, Adult Falls can cause injuries and affect people of all ages. There are many simple things that you can do to make your home safe and to help prevent falls. If you need it, ask for help making these changes. What actions can I take to prevent falls? General information Use good lighting in all rooms. Make sure to: Replace any light bulbs that burn out. Turn on lights if it is dark and use night-lights. Keep items that you use often in easy-to-reach places. Lower the shelves around your home if needed. Move furniture so that there are clear paths around it. Do not keep throw rugs or other things on the floor that can make you trip. If any of your floors are uneven, fix them. Add color or contrast paint or tape to clearly mark and help you see: Grab bars or handrails. First and last steps of staircases. Where the edge of each step is. If  you use a ladder or stepladder: Make sure that it is fully opened. Do not climb a closed ladder. Make sure the sides of the ladder are locked in place. Have someone hold the ladder while you use it. Know where your pets are as you move through your home. What can I do in the bathroom?     Keep the floor dry. Clean up any water that is on the floor right away. Remove soap buildup in the bathtub or shower. Buildup makes bathtubs  and showers slippery. Use non-skid mats or decals on the floor of the bathtub or shower. Attach bath mats securely with double-sided, non-slip rug tape. If you need to sit down while you are in the shower, use a non-slip stool. Install grab bars by the toilet and in the bathtub and shower. Do not use towel bars as grab bars. What can I do in the bedroom? Make sure that you have a light by your bed that is easy to reach. Do not use any sheets or blankets on your bed that hang to the floor. Have a firm bench or chair with side arms that you can use for support when you get dressed. What can I do in the kitchen? Clean up any spills right away. If you need to reach something above you, use a sturdy step stool that has a grab bar. Keep electrical cables out of the way. Do not use floor polish or wax that makes floors slippery. What can I do with my stairs? Do not leave anything on the stairs. Make sure that you have a light switch at the top and the bottom of the stairs. Have them installed if you do not have them. Make sure that there are handrails on both sides of the stairs. Fix handrails that are broken or loose. Make sure that handrails are as long as the staircases. Install non-slip stair treads on all stairs in your home if they do not have carpet. Avoid having throw rugs at the top or bottom of stairs, or secure the rugs with carpet tape to prevent them from moving. Choose a carpet design that does not hide the edge of steps on the stairs. Make sure that carpet is firmly attached to the stairs. Fix any carpet that is loose or worn. What can I do on the outside of my home? Use bright outdoor lighting. Repair the edges of walkways and driveways and fix any cracks. Clear paths of anything that can make you trip, such as tools or rocks. Add color or contrast paint or tape to clearly mark and help you see high doorway thresholds. Trim any bushes or trees on the main path into your home. Check  that handrails are securely fastened and in good repair. Both sides of all steps should have handrails. Install guardrails along the edges of any raised decks or porches. Have leaves, snow, and ice cleared regularly. Use sand, salt, or ice melt on walkways during winter months if you live where there is ice and snow. In the garage, clean up any spills right away, including grease or oil spills. What other actions can I take? Review your medicines with your health care provider. Some medicines can make you confused or feel dizzy. This can increase your chance of falling. Wear closed-toe shoes that fit well and support your feet. Wear shoes that have rubber soles and low heels. Use a cane, walker, scooter, or crutches that help you move around if needed. Talk with your  provider about other ways that you can decrease your risk of falls. This may include seeing a physical therapist to learn to do exercises to improve movement and strength. Where to find more information Centers for Disease Control and Prevention, STEADI: tonerpromos.no [employer] on Aging: baseringtones.pl National Institute on Aging: baseringtones.pl Contact a health care provider if: You are afraid of falling at home. You feel weak, drowsy, or dizzy at home. You fall at home. Get help right away if you: Lose consciousness or have trouble moving after a fall. Have a fall that causes a head injury. These symptoms may be an emergency. Get help right away. Call 911. Do not wait to see if the symptoms will go away. Do not drive yourself to the hospital. This information is not intended to replace advice given to you by your health care provider. Make sure you discuss any questions you have with your health care provider. Document Revised: 10/30/2021 Document Reviewed: 10/30/2021 Elsevier Patient Education  2024 [employer].

## 2024-02-03 ENCOUNTER — Ambulatory Visit: Payer: Self-pay

## 2024-02-03 NOTE — Telephone Encounter (Signed)
 FYI Only or Action Required?: FYI only for provider: appointment scheduled on 02/04/24.  Patient was last seen in primary care on 01/08/2024 by Melvin Pao, NP.  Called Nurse Triage reporting Dysuria and Urinary Frequency.  Symptoms began about a month ago.  Interventions attempted: Nothing.  Symptoms are: unchanged.  Triage Disposition: See Physician Within 24 Hours  Patient/caregiver understands and will follow disposition?: Yes Reason for Disposition  Age > 50 years  Answer Assessment - Initial Assessment Questions Leaving to go out of country on December 5th, was going to go to [facility] today and decided not to. Noticed light spotting of blood after using estradiol  (ESTRACE ) 0.01 % CREA vaginal cream not even for a week.    1. PATTERN: Is pain present every time you urinate or just sometimes?      Not every time, but frequently   2. ONSET: When did the painful urination start?      Month or two  3. FEVER: Do you have a fever? If Yes, ask: What is your temperature, how was it measured, and when did it start?     Denies  4. OTHER SYMPTOMS: Do you have any other symptoms? (e.g., blood in urine, flank pain, genital sores, urgency, vaginal discharge)     Dark urine, denies blood. Noticed odor at the beginning of symptoms happening but not now.  Protocols used: Urination Pain - Female-A-AH  Copied from CRM [id number]. Topic: Clinical - Medical Advice >> Feb 03, 2024 11:28 AM Tiffany B wrote: Reason for CRM:   Patient states estradiol  (ESTRACE ) 0.01 % CREA vaginal cream is not working and started a tiny bleeding spot after 1 week of using it. Patient experiencing urgency when urinating and burning sensation for 1 month.    Patient is going out of the country December 5th and will eventually go to an urgent.

## 2024-02-04 ENCOUNTER — Encounter: Payer: Self-pay | Admitting: Pediatrics

## 2024-02-04 ENCOUNTER — Ambulatory Visit (INDEPENDENT_AMBULATORY_CARE_PROVIDER_SITE_OTHER): Admitting: Pediatrics

## 2024-02-04 VITALS — BP 158/79 | HR 70 | Temp 97.5°F | Ht 60.98 in | Wt 109.2 lb

## 2024-02-04 DIAGNOSIS — I10 Essential (primary) hypertension: Secondary | ICD-10-CM

## 2024-02-04 DIAGNOSIS — N362 Urethral caruncle: Secondary | ICD-10-CM

## 2024-02-04 DIAGNOSIS — R682 Dry mouth, unspecified: Secondary | ICD-10-CM | POA: Diagnosis not present

## 2024-02-04 DIAGNOSIS — N3000 Acute cystitis without hematuria: Secondary | ICD-10-CM

## 2024-02-04 DIAGNOSIS — N949 Unspecified condition associated with female genital organs and menstrual cycle: Secondary | ICD-10-CM

## 2024-02-04 DIAGNOSIS — R399 Unspecified symptoms and signs involving the genitourinary system: Secondary | ICD-10-CM | POA: Diagnosis not present

## 2024-02-04 LAB — URINALYSIS, ROUTINE W REFLEX MICROSCOPIC
Bilirubin, UA: NEGATIVE
Glucose, UA: NEGATIVE
Ketones, UA: NEGATIVE
Nitrite, UA: NEGATIVE
Protein,UA: NEGATIVE
Specific Gravity, UA: 1.01 (ref 1.005–1.030)
Urobilinogen, Ur: 0.2 mg/dL (ref 0.2–1.0)
pH, UA: 7 (ref 5.0–7.5)

## 2024-02-04 LAB — WET PREP FOR TRICH, YEAST, CLUE
Clue Cell Exam: POSITIVE — AB
Trichomonas Exam: NEGATIVE
Yeast Exam: NEGATIVE

## 2024-02-04 LAB — MICROSCOPIC EXAMINATION

## 2024-02-04 MED ORDER — VALSARTAN 160 MG PO TABS: 160.0000 mg | ORAL_TABLET | Freq: Every day | ORAL | 0 refills | Status: AC

## 2024-02-04 NOTE — Progress Notes (Signed)
 Office Visit  BP (!) 158/79 (BP Location: Left Arm, Patient Position: Sitting)   Pulse 70   Temp (!) 97.5 F (36.4 C) (Oral)   Ht 5' 0.98 (1.549 m)   Wt 109 lb 3.2 oz (49.5 kg)   SpO2 98%   BMI 20.64 kg/m    Subjective:    Patient ID: Eileen Stewart, female    DOB: 1950-05-24, 73 y.o.   MRN: [id number]  HPI: Eileen Stewart is a 73 y.o. female  Chief Complaint  Patient presents with   Dysuria    Patient stated urination is uncomfortable, burning. She is using estrogen cream, but it's not really working for her. There is some bleeding and a blister like sore inside of her, but still visible.    Discussed the use of AI scribe software for clinical note transcription with the patient, who gave verbal consent to proceed.  History of Present Illness   Eileen Stewart is a 73 year old female who presents with persistent urinary and vaginal symptoms.  She has been experiencing persistent urinary and vaginal symptoms for approximately two months. Initially, the pain was severe, rated as a ten, but has now reduced to a one or two. She notes a fleshy growth around the urethra, described as a small, non-bothersome auricle, and a 'tiny growth like a little mushroom' in the vaginal opening, which appears to be resolving. No pain is associated with the vaginal growth.  She previously attempted treatment with estrogen, which resulted in irritation and spotting of blood, leading her to discontinue its use after less than a week.  She has a history of high blood pressure, which remains elevated despite medication adjustments. She is currently taking valsartan . She also experiences significant dry mouth, which she attributes to oxybutynin , a medication she takes for overactive bladder.  She is planning to travel to [place], visiting [place], [place], and [place], and is concerned about managing her symptoms while abroad.        Relevant past medical, surgical, family and social history  reviewed and updated as indicated. Interim medical history since our last visit reviewed. Allergies and medications reviewed and updated.  ROS per HPI unless specifically indicated above     Objective:    BP (!) 158/79 (BP Location: Left Arm, Patient Position: Sitting)   Pulse 70   Temp (!) 97.5 F (36.4 C) (Oral)   Ht 5' 0.98 (1.549 m)   Wt 109 lb 3.2 oz (49.5 kg)   SpO2 98%   BMI 20.64 kg/m   Wt Readings from Last 3 Encounters:  02/04/24 109 lb 3.2 oz (49.5 kg)  01/28/24 112 lb (50.8 kg)  01/08/24 110 lb 6.4 oz (50.1 kg)     Physical Exam Exam conducted with a chaperone present.  Constitutional:      Appearance: Normal appearance.  Pulmonary:     Effort: Pulmonary effort is normal.  Genitourinary:    General: Normal vulva.     Urethra: Urethral lesion present.     Comments: Erythematous pearly growth on urethral opening. Vaginal canal wnl. Musculoskeletal:        General: Normal range of motion.  Skin:    Comments: Normal skin color  Neurological:     General: No focal deficit present.     Mental Status: She is alert. Mental status is at baseline.  Psychiatric:        Mood and Affect: Mood normal.        Behavior: Behavior normal.  Thought Content: Thought content normal.         02/04/2024   11:24 AM 01/28/2024   10:11 AM 01/08/2024    1:53 PM 11/13/2023    1:14 PM 09/11/2023    2:10 PM  Depression screen PHQ 2/9  Decreased Interest 0 0 0 1 2  Down, Depressed, Hopeless 0 0 0 0 0  PHQ - 2 Score 0 0 0 1 2  Altered sleeping 0 0 1 1 0  Tired, decreased energy 0 0 1 0 0  Change in appetite 0 0 1 0 0  Feeling bad or failure about yourself  0 0 0 0 0  Trouble concentrating 1 0 1 0 1  Moving slowly or fidgety/restless 0 0 0 0 0  Suicidal thoughts 0 0 0 0 0  PHQ-9 Score 1 0 4  2  3    Difficult doing work/chores Somewhat difficult Not difficult at all Somewhat difficult Not difficult at all Not difficult at all     Data saved with a previous flowsheet  row definition       02/04/2024   11:24 AM 01/08/2024    1:54 PM 11/13/2023    1:15 PM 09/11/2023    2:10 PM  GAD 7 : Generalized Anxiety Score  Nervous, Anxious, on Edge 1 0 1 1  Control/stop worrying 0 0 0 0  Worry too much - different things 1 0 0 0  Trouble relaxing 0 0 0 0  Restless 0 0 0 0  Easily annoyed or irritable 0 0 1 1  Afraid - awful might happen 0 0 0 0  Total GAD 7 Score 2 0 2 2  Anxiety Difficulty Somewhat difficult  Not difficult at all Not difficult at all       Assessment & Plan:  Assessment & Plan   Acute cystitis without hematuria Vaginal discomfort Urethral caruncle Assessment & Plan: 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 Estrogen therapy discontinued due to irritation. Urogynecology referral considered for persistent symptoms. Visualized on exam today and looks irritated. Swabs did show BV and many bacteria, 1+leuk on urinalysis so presumably has UTI since symptoms more c/f cystitis. If persistent symptoms, recommended she returned for repeat swab in case symptoms related to BV. - Referred to urogynecology for further evaluation and management. - Await results of urinary tract infection tests. -     Urinalysis, Routine w reflex microscopic -     Urine Culture -     WET PREP FOR TRICH, YEAST, CLUE  Orders: -     Ambulatory referral to Urogynecology  Essential hypertension Assessment & Plan: Chronic essential hypertension with elevated readings. Current regimen includes valsartan , which may be increased for better control. - Increased valsartan  to 160 mg daily. - Monitor blood pressure and adjust treatment as needed. Will schedule follow up with PCP once back from [place] trip  Orders: -     Valsartan ; Take 1 tablet (160 mg total) by mouth daily.  Dispense: 90 tablet; Refill: 0  Dry mouth OAB symptoms not well-managed with oxybutynin , suspect cause of significant dry mouth. - Discontinued oxybutynin  to assess resolution of  dry mouth symptoms.  Follow up plan: Return if symptoms worsen or fail to improve, for HTN.  Hadassah SHAUNNA Nett, MD

## 2024-02-04 NOTE — Patient Instructions (Addendum)
 Your urine showed bacteria so you probably have a urinary tract infection, I will treat with an antibiotic called macrobid  for 5 days  If not better please call to schedule.  The area irritated on exam is from your urethra. This looks like a urethral caruncle. I placed a referral for urogynecology to assess.   I think the dry mouth is probably from the oxybutynin  so you can stop it and see if your symptoms resolve   For your blood pressure: Increase valsartan  to 160mg , you can double up on the 80mg  you have at home or just pick up this new prescription.  Please schedule a follow up with Darice after your back from your trip

## 2024-02-04 NOTE — Assessment & Plan Note (Signed)
 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 Estrogen therapy discontinued due to irritation. Urogynecology referral considered for persistent symptoms. Visualized on exam today and looks irritated. Swabs did show BV and many bacteria, 1+leuk on urinalysis so presumably has UTI since symptoms more c/f cystitis. If persistent symptoms, recommended she returned for repeat swab in case symptoms related to BV. - Referred to urogynecology for further evaluation and management. - Await results of urinary tract infection tests.

## 2024-02-04 NOTE — Assessment & Plan Note (Signed)
 Chronic essential hypertension with elevated readings. Current regimen includes valsartan , which may be increased for better control. - Increased valsartan  to 160 mg daily. - Monitor blood pressure and adjust treatment as needed. Will schedule follow up with PCP once back from [place] trip

## 2024-02-05 ENCOUNTER — Ambulatory Visit: Payer: Self-pay | Admitting: Pediatrics

## 2024-02-05 MED ORDER — NITROFURANTOIN MONOHYD MACRO 100 MG PO CAPS: 100.0000 mg | ORAL_CAPSULE | Freq: Two times a day (BID) | ORAL | 0 refills | Status: AC

## 2024-02-05 NOTE — Addendum Note (Signed)
 Addended by: Shahira Fiske P on: 02/05/2024 03:54 PM   Modules accepted: Orders

## 2024-02-08 LAB — URINE CULTURE

## 2024-02-12 ENCOUNTER — Ambulatory Visit: Admitting: Nurse Practitioner

## 2024-04-13 ENCOUNTER — Telehealth: Payer: Self-pay

## 2024-04-13 NOTE — Progress Notes (Unsigned)
 Pharmacy Quality Measure Review  This patient is appearing on a report for being at risk of failing the adherence measure for cholesterol (statin) medications this calendar year.   Medication: Rosuvastatin  10mg  Last fill date: 01/08/24 for 90 day supply  Patient should've ran out of supply on 04/07/2024 and is currently nonadherent. There is currently one refill remaining at [facility].    Lisle Slocumb Student-PharmD.

## 2024-04-27 ENCOUNTER — Ambulatory Visit: Admitting: Urology

## 2025-01-28 ENCOUNTER — Ambulatory Visit
